# Patient Record
Sex: Female | Born: 1940
Health system: Southern US, Community
[De-identification: ages and names within clinical notes are randomized; demographics above are authoritative.]

## PROBLEM LIST (undated history)

## (undated) DIAGNOSIS — K5790 Diverticulosis of intestine, part unspecified, without perforation or abscess without bleeding: Secondary | ICD-10-CM

## (undated) DIAGNOSIS — K219 Gastro-esophageal reflux disease without esophagitis: Secondary | ICD-10-CM

## (undated) DIAGNOSIS — K5792 Diverticulitis of intestine, part unspecified, without perforation or abscess without bleeding: Secondary | ICD-10-CM

## (undated) DIAGNOSIS — M810 Age-related osteoporosis without current pathological fracture: Secondary | ICD-10-CM

## (undated) DIAGNOSIS — M1990 Unspecified osteoarthritis, unspecified site: Secondary | ICD-10-CM

## (undated) DIAGNOSIS — C4490 Unspecified malignant neoplasm of skin, unspecified: Secondary | ICD-10-CM

## (undated) DIAGNOSIS — Z0000 Encounter for general adult medical examination without abnormal findings: Secondary | ICD-10-CM

## (undated) DIAGNOSIS — B019 Varicella without complication: Secondary | ICD-10-CM

## (undated) DIAGNOSIS — F419 Anxiety disorder, unspecified: Secondary | ICD-10-CM

## (undated) DIAGNOSIS — N952 Postmenopausal atrophic vaginitis: Secondary | ICD-10-CM

## (undated) DIAGNOSIS — E785 Hyperlipidemia, unspecified: Secondary | ICD-10-CM

## (undated) DIAGNOSIS — R32 Unspecified urinary incontinence: Secondary | ICD-10-CM

## (undated) DIAGNOSIS — K635 Polyp of colon: Secondary | ICD-10-CM

## (undated) DIAGNOSIS — H269 Unspecified cataract: Secondary | ICD-10-CM

## (undated) DIAGNOSIS — H8110 Benign paroxysmal vertigo, unspecified ear: Secondary | ICD-10-CM

## (undated) DIAGNOSIS — E2740 Unspecified adrenocortical insufficiency: Secondary | ICD-10-CM

## (undated) DIAGNOSIS — I10 Essential (primary) hypertension: Secondary | ICD-10-CM

## (undated) DIAGNOSIS — B078 Other viral warts: Secondary | ICD-10-CM

## (undated) DIAGNOSIS — E559 Vitamin D deficiency, unspecified: Secondary | ICD-10-CM

## (undated) HISTORY — DX: Unspecified adrenocortical insufficiency: E27.40

## (undated) HISTORY — DX: Age-related osteoporosis without current pathological fracture: M81.0

## (undated) HISTORY — DX: Unspecified malignant neoplasm of skin, unspecified: C44.90

## (undated) HISTORY — DX: Encounter for general adult medical examination without abnormal findings: Z00.00

## (undated) HISTORY — DX: Gastro-esophageal reflux disease without esophagitis: K21.9

## (undated) HISTORY — DX: Essential (primary) hypertension: I10

## (undated) HISTORY — DX: Varicella without complication: B01.9

## (undated) HISTORY — DX: Unspecified osteoarthritis, unspecified site: M19.90

## (undated) HISTORY — PX: DILATION AND CURETTAGE OF UTERUS: SHX78

## (undated) HISTORY — DX: Vitamin D deficiency, unspecified: E55.9

## (undated) HISTORY — DX: Postmenopausal atrophic vaginitis: N95.2

## (undated) HISTORY — DX: Hyperlipidemia, unspecified: E78.5

## (undated) HISTORY — DX: Unspecified urinary incontinence: R32

## (undated) HISTORY — DX: Anxiety disorder, unspecified: F41.9

## (undated) HISTORY — DX: Unspecified cataract: H26.9

## (undated) HISTORY — DX: Diverticulitis of intestine, part unspecified, without perforation or abscess without bleeding: K57.92

## (undated) HISTORY — PX: CATARACT EXTRACTION: SUR2

## (undated) HISTORY — DX: Benign paroxysmal vertigo, unspecified ear: H81.10

## (undated) HISTORY — PX: EYE SURGERY: SHX253

## (undated) HISTORY — DX: Polyp of colon: K63.5

## (undated) HISTORY — DX: Other viral warts: B07.8

## (undated) HISTORY — PX: BREAST BIOPSY: SHX20

## (undated) HISTORY — DX: Diverticulosis of intestine, part unspecified, without perforation or abscess without bleeding: K57.90

---

## 1988-07-15 HISTORY — PX: TONSILLECTOMY AND ADENOIDECTOMY: SHX28

## 1995-07-16 HISTORY — PX: ABDOMINAL HYSTERECTOMY: SHX81

## 2011-07-25 DIAGNOSIS — M810 Age-related osteoporosis without current pathological fracture: Secondary | ICD-10-CM | POA: Diagnosis not present

## 2011-07-25 LAB — HM DEXA SCAN

## 2011-08-06 DIAGNOSIS — M999 Biomechanical lesion, unspecified: Secondary | ICD-10-CM | POA: Diagnosis not present

## 2011-08-06 DIAGNOSIS — M545 Low back pain: Secondary | ICD-10-CM | POA: Diagnosis not present

## 2011-08-06 DIAGNOSIS — M47817 Spondylosis without myelopathy or radiculopathy, lumbosacral region: Secondary | ICD-10-CM | POA: Diagnosis not present

## 2011-09-03 DIAGNOSIS — M47817 Spondylosis without myelopathy or radiculopathy, lumbosacral region: Secondary | ICD-10-CM | POA: Diagnosis not present

## 2011-09-03 DIAGNOSIS — M999 Biomechanical lesion, unspecified: Secondary | ICD-10-CM | POA: Diagnosis not present

## 2011-09-03 DIAGNOSIS — M545 Low back pain: Secondary | ICD-10-CM | POA: Diagnosis not present

## 2011-09-13 DEATH — deceased

## 2011-10-08 DIAGNOSIS — M47817 Spondylosis without myelopathy or radiculopathy, lumbosacral region: Secondary | ICD-10-CM | POA: Diagnosis not present

## 2011-10-08 DIAGNOSIS — M999 Biomechanical lesion, unspecified: Secondary | ICD-10-CM | POA: Diagnosis not present

## 2011-10-08 DIAGNOSIS — M545 Low back pain: Secondary | ICD-10-CM | POA: Diagnosis not present

## 2011-11-04 DIAGNOSIS — M810 Age-related osteoporosis without current pathological fracture: Secondary | ICD-10-CM | POA: Diagnosis not present

## 2011-11-12 DIAGNOSIS — M999 Biomechanical lesion, unspecified: Secondary | ICD-10-CM | POA: Diagnosis not present

## 2011-11-12 DIAGNOSIS — M47817 Spondylosis without myelopathy or radiculopathy, lumbosacral region: Secondary | ICD-10-CM | POA: Diagnosis not present

## 2011-11-12 DIAGNOSIS — M545 Low back pain: Secondary | ICD-10-CM | POA: Diagnosis not present

## 2011-11-21 DIAGNOSIS — Z961 Presence of intraocular lens: Secondary | ICD-10-CM | POA: Diagnosis not present

## 2011-11-21 DIAGNOSIS — H40019 Open angle with borderline findings, low risk, unspecified eye: Secondary | ICD-10-CM | POA: Diagnosis not present

## 2011-12-10 DIAGNOSIS — M47817 Spondylosis without myelopathy or radiculopathy, lumbosacral region: Secondary | ICD-10-CM | POA: Diagnosis not present

## 2011-12-10 DIAGNOSIS — M545 Low back pain: Secondary | ICD-10-CM | POA: Diagnosis not present

## 2011-12-10 DIAGNOSIS — M999 Biomechanical lesion, unspecified: Secondary | ICD-10-CM | POA: Diagnosis not present

## 2011-12-31 DIAGNOSIS — J3089 Other allergic rhinitis: Secondary | ICD-10-CM | POA: Diagnosis not present

## 2011-12-31 DIAGNOSIS — H6120 Impacted cerumen, unspecified ear: Secondary | ICD-10-CM | POA: Diagnosis not present

## 2011-12-31 DIAGNOSIS — H903 Sensorineural hearing loss, bilateral: Secondary | ICD-10-CM | POA: Diagnosis not present

## 2011-12-31 DIAGNOSIS — J310 Chronic rhinitis: Secondary | ICD-10-CM | POA: Diagnosis not present

## 2012-01-09 DIAGNOSIS — Z1231 Encounter for screening mammogram for malignant neoplasm of breast: Secondary | ICD-10-CM | POA: Diagnosis not present

## 2012-01-14 DIAGNOSIS — M545 Low back pain: Secondary | ICD-10-CM | POA: Diagnosis not present

## 2012-01-14 DIAGNOSIS — M47817 Spondylosis without myelopathy or radiculopathy, lumbosacral region: Secondary | ICD-10-CM | POA: Diagnosis not present

## 2012-01-14 DIAGNOSIS — M999 Biomechanical lesion, unspecified: Secondary | ICD-10-CM | POA: Diagnosis not present

## 2012-02-25 DIAGNOSIS — R51 Headache: Secondary | ICD-10-CM | POA: Diagnosis not present

## 2012-02-25 DIAGNOSIS — M542 Cervicalgia: Secondary | ICD-10-CM | POA: Diagnosis not present

## 2012-02-25 DIAGNOSIS — M47812 Spondylosis without myelopathy or radiculopathy, cervical region: Secondary | ICD-10-CM | POA: Diagnosis not present

## 2012-02-25 DIAGNOSIS — M9981 Other biomechanical lesions of cervical region: Secondary | ICD-10-CM | POA: Diagnosis not present

## 2012-03-31 DIAGNOSIS — M47812 Spondylosis without myelopathy or radiculopathy, cervical region: Secondary | ICD-10-CM | POA: Diagnosis not present

## 2012-03-31 DIAGNOSIS — R51 Headache: Secondary | ICD-10-CM | POA: Diagnosis not present

## 2012-03-31 DIAGNOSIS — M9981 Other biomechanical lesions of cervical region: Secondary | ICD-10-CM | POA: Diagnosis not present

## 2012-03-31 DIAGNOSIS — M542 Cervicalgia: Secondary | ICD-10-CM | POA: Diagnosis not present

## 2012-04-21 DIAGNOSIS — Z0000 Encounter for general adult medical examination without abnormal findings: Secondary | ICD-10-CM | POA: Diagnosis not present

## 2012-05-06 DIAGNOSIS — K219 Gastro-esophageal reflux disease without esophagitis: Secondary | ICD-10-CM | POA: Diagnosis not present

## 2012-05-06 DIAGNOSIS — Z23 Encounter for immunization: Secondary | ICD-10-CM | POA: Diagnosis not present

## 2012-05-06 DIAGNOSIS — M899 Disorder of bone, unspecified: Secondary | ICD-10-CM | POA: Diagnosis not present

## 2012-05-06 DIAGNOSIS — M949 Disorder of cartilage, unspecified: Secondary | ICD-10-CM | POA: Diagnosis not present

## 2012-05-06 DIAGNOSIS — I10 Essential (primary) hypertension: Secondary | ICD-10-CM | POA: Diagnosis not present

## 2012-05-06 DIAGNOSIS — E7800 Pure hypercholesterolemia, unspecified: Secondary | ICD-10-CM | POA: Diagnosis not present

## 2012-05-19 DIAGNOSIS — M9981 Other biomechanical lesions of cervical region: Secondary | ICD-10-CM | POA: Diagnosis not present

## 2012-05-19 DIAGNOSIS — M47812 Spondylosis without myelopathy or radiculopathy, cervical region: Secondary | ICD-10-CM | POA: Diagnosis not present

## 2012-05-19 DIAGNOSIS — M542 Cervicalgia: Secondary | ICD-10-CM | POA: Diagnosis not present

## 2012-05-19 DIAGNOSIS — R51 Headache: Secondary | ICD-10-CM | POA: Diagnosis not present

## 2012-05-27 DIAGNOSIS — N760 Acute vaginitis: Secondary | ICD-10-CM | POA: Diagnosis not present

## 2012-05-27 DIAGNOSIS — N390 Urinary tract infection, site not specified: Secondary | ICD-10-CM | POA: Diagnosis not present

## 2012-06-30 DIAGNOSIS — H6120 Impacted cerumen, unspecified ear: Secondary | ICD-10-CM | POA: Diagnosis not present

## 2012-06-30 DIAGNOSIS — M9981 Other biomechanical lesions of cervical region: Secondary | ICD-10-CM | POA: Diagnosis not present

## 2012-06-30 DIAGNOSIS — M47812 Spondylosis without myelopathy or radiculopathy, cervical region: Secondary | ICD-10-CM | POA: Diagnosis not present

## 2012-06-30 DIAGNOSIS — R51 Headache: Secondary | ICD-10-CM | POA: Diagnosis not present

## 2012-06-30 DIAGNOSIS — M542 Cervicalgia: Secondary | ICD-10-CM | POA: Diagnosis not present

## 2012-08-04 DIAGNOSIS — M545 Low back pain: Secondary | ICD-10-CM | POA: Diagnosis not present

## 2012-08-04 DIAGNOSIS — M999 Biomechanical lesion, unspecified: Secondary | ICD-10-CM | POA: Diagnosis not present

## 2012-08-04 DIAGNOSIS — M47817 Spondylosis without myelopathy or radiculopathy, lumbosacral region: Secondary | ICD-10-CM | POA: Diagnosis not present

## 2012-09-01 DIAGNOSIS — M999 Biomechanical lesion, unspecified: Secondary | ICD-10-CM | POA: Diagnosis not present

## 2012-09-01 DIAGNOSIS — M545 Low back pain: Secondary | ICD-10-CM | POA: Diagnosis not present

## 2012-09-01 DIAGNOSIS — M47817 Spondylosis without myelopathy or radiculopathy, lumbosacral region: Secondary | ICD-10-CM | POA: Diagnosis not present

## 2012-10-06 DIAGNOSIS — M47817 Spondylosis without myelopathy or radiculopathy, lumbosacral region: Secondary | ICD-10-CM | POA: Diagnosis not present

## 2012-10-06 DIAGNOSIS — M999 Biomechanical lesion, unspecified: Secondary | ICD-10-CM | POA: Diagnosis not present

## 2012-10-06 DIAGNOSIS — M545 Low back pain: Secondary | ICD-10-CM | POA: Diagnosis not present

## 2012-11-02 DIAGNOSIS — D235 Other benign neoplasm of skin of trunk: Secondary | ICD-10-CM | POA: Diagnosis not present

## 2012-11-02 DIAGNOSIS — D1801 Hemangioma of skin and subcutaneous tissue: Secondary | ICD-10-CM | POA: Diagnosis not present

## 2012-11-02 DIAGNOSIS — L821 Other seborrheic keratosis: Secondary | ICD-10-CM | POA: Diagnosis not present

## 2012-11-02 DIAGNOSIS — L819 Disorder of pigmentation, unspecified: Secondary | ICD-10-CM | POA: Diagnosis not present

## 2012-11-04 DIAGNOSIS — K219 Gastro-esophageal reflux disease without esophagitis: Secondary | ICD-10-CM | POA: Diagnosis not present

## 2012-11-04 DIAGNOSIS — I10 Essential (primary) hypertension: Secondary | ICD-10-CM | POA: Diagnosis not present

## 2012-11-04 DIAGNOSIS — E7800 Pure hypercholesterolemia, unspecified: Secondary | ICD-10-CM | POA: Diagnosis not present

## 2012-11-10 DIAGNOSIS — M47817 Spondylosis without myelopathy or radiculopathy, lumbosacral region: Secondary | ICD-10-CM | POA: Diagnosis not present

## 2012-11-10 DIAGNOSIS — M545 Low back pain: Secondary | ICD-10-CM | POA: Diagnosis not present

## 2012-11-10 DIAGNOSIS — M999 Biomechanical lesion, unspecified: Secondary | ICD-10-CM | POA: Diagnosis not present

## 2012-12-04 DIAGNOSIS — I10 Essential (primary) hypertension: Secondary | ICD-10-CM | POA: Diagnosis not present

## 2012-12-04 DIAGNOSIS — H35379 Puckering of macula, unspecified eye: Secondary | ICD-10-CM | POA: Diagnosis not present

## 2012-12-04 DIAGNOSIS — H40059 Ocular hypertension, unspecified eye: Secondary | ICD-10-CM | POA: Diagnosis not present

## 2012-12-29 DIAGNOSIS — M999 Biomechanical lesion, unspecified: Secondary | ICD-10-CM | POA: Diagnosis not present

## 2012-12-29 DIAGNOSIS — M545 Low back pain: Secondary | ICD-10-CM | POA: Diagnosis not present

## 2012-12-29 DIAGNOSIS — M47817 Spondylosis without myelopathy or radiculopathy, lumbosacral region: Secondary | ICD-10-CM | POA: Diagnosis not present

## 2012-12-30 DIAGNOSIS — J3089 Other allergic rhinitis: Secondary | ICD-10-CM | POA: Diagnosis not present

## 2012-12-30 DIAGNOSIS — H903 Sensorineural hearing loss, bilateral: Secondary | ICD-10-CM | POA: Diagnosis not present

## 2012-12-30 DIAGNOSIS — H6120 Impacted cerumen, unspecified ear: Secondary | ICD-10-CM | POA: Diagnosis not present

## 2013-03-09 ENCOUNTER — Encounter: Payer: Self-pay | Admitting: Family Medicine

## 2013-03-09 ENCOUNTER — Other Ambulatory Visit: Payer: Self-pay

## 2013-03-09 ENCOUNTER — Ambulatory Visit (INDEPENDENT_AMBULATORY_CARE_PROVIDER_SITE_OTHER): Payer: Medicare Other | Admitting: Family Medicine

## 2013-03-09 ENCOUNTER — Telehealth: Payer: Self-pay | Admitting: Family Medicine

## 2013-03-09 VITALS — BP 138/84 | HR 65 | Temp 97.9°F | Ht 60.0 in | Wt 130.1 lb

## 2013-03-09 DIAGNOSIS — K219 Gastro-esophageal reflux disease without esophagitis: Secondary | ICD-10-CM | POA: Insufficient documentation

## 2013-03-09 DIAGNOSIS — F341 Dysthymic disorder: Secondary | ICD-10-CM | POA: Diagnosis not present

## 2013-03-09 DIAGNOSIS — R32 Unspecified urinary incontinence: Secondary | ICD-10-CM

## 2013-03-09 DIAGNOSIS — E785 Hyperlipidemia, unspecified: Secondary | ICD-10-CM

## 2013-03-09 DIAGNOSIS — D126 Benign neoplasm of colon, unspecified: Secondary | ICD-10-CM

## 2013-03-09 DIAGNOSIS — E2740 Unspecified adrenocortical insufficiency: Secondary | ICD-10-CM

## 2013-03-09 DIAGNOSIS — M810 Age-related osteoporosis without current pathological fracture: Secondary | ICD-10-CM

## 2013-03-09 DIAGNOSIS — F411 Generalized anxiety disorder: Secondary | ICD-10-CM

## 2013-03-09 DIAGNOSIS — I10 Essential (primary) hypertension: Secondary | ICD-10-CM

## 2013-03-09 DIAGNOSIS — M1990 Unspecified osteoarthritis, unspecified site: Secondary | ICD-10-CM | POA: Insufficient documentation

## 2013-03-09 DIAGNOSIS — F418 Other specified anxiety disorders: Secondary | ICD-10-CM

## 2013-03-09 DIAGNOSIS — K5790 Diverticulosis of intestine, part unspecified, without perforation or abscess without bleeding: Secondary | ICD-10-CM

## 2013-03-09 DIAGNOSIS — B019 Varicella without complication: Secondary | ICD-10-CM | POA: Insufficient documentation

## 2013-03-09 DIAGNOSIS — F419 Anxiety disorder, unspecified: Secondary | ICD-10-CM | POA: Insufficient documentation

## 2013-03-09 DIAGNOSIS — K635 Polyp of colon: Secondary | ICD-10-CM | POA: Insufficient documentation

## 2013-03-09 DIAGNOSIS — Z1231 Encounter for screening mammogram for malignant neoplasm of breast: Secondary | ICD-10-CM

## 2013-03-09 DIAGNOSIS — E2749 Other adrenocortical insufficiency: Secondary | ICD-10-CM

## 2013-03-09 DIAGNOSIS — K5730 Diverticulosis of large intestine without perforation or abscess without bleeding: Secondary | ICD-10-CM

## 2013-03-09 HISTORY — DX: Age-related osteoporosis without current pathological fracture: M81.0

## 2013-03-09 MED ORDER — FLUOXETINE HCL 20 MG PO TABS: 20.0000 mg | ORAL_TABLET | Freq: Every day | ORAL | Status: DC

## 2013-03-09 NOTE — Assessment & Plan Note (Signed)
asymptomatic

## 2013-03-09 NOTE — Assessment & Plan Note (Signed)
Check vitamin d level with next blood draw continue calcium, vitamin d supplements

## 2013-03-09 NOTE — Assessment & Plan Note (Signed)
Well controlled on Ramipril continue the same

## 2013-03-09 NOTE — Assessment & Plan Note (Signed)
Tolerating simvastatin, recheck level prior to next visit.

## 2013-03-09 NOTE — Assessment & Plan Note (Signed)
Well controlled on current meds, no changes, avoid offending foods

## 2013-03-09 NOTE — Assessment & Plan Note (Signed)
Well controlled on Fluoxetine, given a refill on this med today

## 2013-03-09 NOTE — Telephone Encounter (Signed)
LAB ORDER FOR THE WEEK OF 11-20 Check out comments: Next visit annual with labs prior to visit, lipid, renal, cbc, tsh, vitamin d

## 2013-03-09 NOTE — Patient Instructions (Addendum)
Preventive Care for Adults, Female A healthy lifestyle and preventive care can promote health and wellness. Preventive health guidelines for women include the following key practices.  A routine yearly physical is a good way to check with your caregiver about your health and preventive screening. It is a chance to share any concerns and updates on your health, and to receive a thorough exam.  Visit your dentist for a routine exam and preventive care every 6 months. Brush your teeth twice a day and floss once a day. Good oral hygiene prevents tooth decay and gum disease.  The frequency of eye exams is based on your age, health, family medical history, use of contact lenses, and other factors. Follow your caregiver's recommendations for frequency of eye exams.  Eat a healthy diet. Foods like vegetables, fruits, whole grains, low-fat dairy products, and lean protein foods contain the nutrients you need without too many calories. Decrease your intake of foods high in solid fats, added sugars, and salt. Eat the right amount of calories for you.Get information about a proper diet from your caregiver, if necessary.  Regular physical exercise is one of the most important things you can do for your health. Most adults should get at least 150 minutes of moderate-intensity exercise (any activity that increases your heart rate and causes you to sweat) each week. In addition, most adults need muscle-strengthening exercises on 2 or more days a week.  Maintain a healthy weight. The body mass index (BMI) is a screening tool to identify possible weight problems. It provides an estimate of body fat based on height and weight. Your caregiver can help determine your BMI, and can help you achieve or maintain a healthy weight.For adults 20 years and older:  A BMI below 18.5 is considered underweight.  A BMI of 18.5 to 24.9 is normal.  A BMI of 25 to 29.9 is considered overweight.  A BMI of 30 and above is  considered obese.  Maintain normal blood lipids and cholesterol levels by exercising and minimizing your intake of saturated fat. Eat a balanced diet with plenty of fruit and vegetables. Blood tests for lipids and cholesterol should begin at age 20 and be repeated every 5 years. If your lipid or cholesterol levels are high, you are over 50, or you are at high risk for heart disease, you may need your cholesterol levels checked more frequently.Ongoing high lipid and cholesterol levels should be treated with medicines if diet and exercise are not effective.  If you smoke, find out from your caregiver how to quit. If you do not use tobacco, do not start.  If you are pregnant, do not drink alcohol. If you are breastfeeding, be very cautious about drinking alcohol. If you are not pregnant and choose to drink alcohol, do not exceed 1 drink per day. One drink is considered to be 12 ounces (355 mL) of beer, 5 ounces (148 mL) of wine, or 1.5 ounces (44 mL) of liquor.  Avoid use of street drugs. Do not share needles with anyone. Ask for help if you need support or instructions about stopping the use of drugs.  High blood pressure causes heart disease and increases the risk of stroke. Your blood pressure should be checked at least every 1 to 2 years. Ongoing high blood pressure should be treated with medicines if weight loss and exercise are not effective.  If you are 55 to 72 years old, ask your caregiver if you should take aspirin to prevent strokes.  Diabetes   screening involves taking a blood sample to check your fasting blood sugar level. This should be done once every 3 years, after age 45, if you are within normal weight and without risk factors for diabetes. Testing should be considered at a younger age or be carried out more frequently if you are overweight and have at least 1 risk factor for diabetes.  Breast cancer screening is essential preventive care for women. You should practice "breast  self-awareness." This means understanding the normal appearance and feel of your breasts and may include breast self-examination. Any changes detected, no matter how small, should be reported to a caregiver. Women in their 20s and 30s should have a clinical breast exam (CBE) by a caregiver as part of a regular health exam every 1 to 3 years. After age 40, women should have a CBE every year. Starting at age 40, women should consider having a mammography (breast X-ray test) every year. Women who have a family history of breast cancer should talk to their caregiver about genetic screening. Women at a high risk of breast cancer should talk to their caregivers about having magnetic resonance imaging (MRI) and a mammography every year.  The Pap test is a screening test for cervical cancer. A Pap test can show cell changes on the cervix that might become cervical cancer if left untreated. A Pap test is a procedure in which cells are obtained and examined from the lower end of the uterus (cervix).  Women should have a Pap test starting at age 21.  Between ages 21 and 29, Pap tests should be repeated every 2 years.  Beginning at age 30, you should have a Pap test every 3 years as long as the past 3 Pap tests have been normal.  Some women have medical problems that increase the chance of getting cervical cancer. Talk to your caregiver about these problems. It is especially important to talk to your caregiver if a new problem develops soon after your last Pap test. In these cases, your caregiver may recommend more frequent screening and Pap tests.  The above recommendations are the same for women who have or have not gotten the vaccine for human papillomavirus (HPV).  If you had a hysterectomy for a problem that was not cancer or a condition that could lead to cancer, then you no longer need Pap tests. Even if you no longer need a Pap test, a regular exam is a good idea to make sure no other problems are  starting.  If you are between ages 65 and 70, and you have had normal Pap tests going back 10 years, you no longer need Pap tests. Even if you no longer need a Pap test, a regular exam is a good idea to make sure no other problems are starting.  If you have had past treatment for cervical cancer or a condition that could lead to cancer, you need Pap tests and screening for cancer for at least 20 years after your treatment.  If Pap tests have been discontinued, risk factors (such as a new sexual partner) need to be reassessed to determine if screening should be resumed.  The HPV test is an additional test that may be used for cervical cancer screening. The HPV test looks for the virus that can cause the cell changes on the cervix. The cells collected during the Pap test can be tested for HPV. The HPV test could be used to screen women aged 30 years and older, and should   be used in women of any age who have unclear Pap test results. After the age of 30, women should have HPV testing at the same frequency as a Pap test.  Colorectal cancer can be detected and often prevented. Most routine colorectal cancer screening begins at the age of 50 and continues through age 75. However, your caregiver may recommend screening at an earlier age if you have risk factors for colon cancer. On a yearly basis, your caregiver may provide home test kits to check for hidden blood in the stool. Use of a small camera at the end of a tube, to directly examine the colon (sigmoidoscopy or colonoscopy), can detect the earliest forms of colorectal cancer. Talk to your caregiver about this at age 50, when routine screening begins. Direct examination of the colon should be repeated every 5 to 10 years through age 75, unless early forms of pre-cancerous polyps or small growths are found.  Hepatitis C blood testing is recommended for all people born from 1945 through 1965 and any individual with known risks for hepatitis C.  Practice  safe sex. Use condoms and avoid high-risk sexual practices to reduce the spread of sexually transmitted infections (STIs). STIs include gonorrhea, chlamydia, syphilis, trichomonas, herpes, HPV, and human immunodeficiency virus (HIV). Herpes, HIV, and HPV are viral illnesses that have no cure. They can result in disability, cancer, and death. Sexually active women aged 25 and younger should be checked for chlamydia. Older women with new or multiple partners should also be tested for chlamydia. Testing for other STIs is recommended if you are sexually active and at increased risk.  Osteoporosis is a disease in which the bones lose minerals and strength with aging. This can result in serious bone fractures. The risk of osteoporosis can be identified using a bone density scan. Women ages 65 and over and women at risk for fractures or osteoporosis should discuss screening with their caregivers. Ask your caregiver whether you should take a calcium supplement or vitamin D to reduce the rate of osteoporosis.  Menopause can be associated with physical symptoms and risks. Hormone replacement therapy is available to decrease symptoms and risks. You should talk to your caregiver about whether hormone replacement therapy is right for you.  Use sunscreen with sun protection factor (SPF) of 30 or more. Apply sunscreen liberally and repeatedly throughout the day. You should seek shade when your shadow is shorter than you. Protect yourself by wearing long sleeves, pants, a wide-brimmed hat, and sunglasses year round, whenever you are outdoors.  Once a month, do a whole body skin exam, using a mirror to look at the skin on your back. Notify your caregiver of new moles, moles that have irregular borders, moles that are larger than a pencil eraser, or moles that have changed in shape or color.  Stay current with required immunizations.  Influenza. You need a dose every fall (or winter). The composition of the flu vaccine  changes each year, so being vaccinated once is not enough.  Pneumococcal polysaccharide. You need 1 to 2 doses if you smoke cigarettes or if you have certain chronic medical conditions. You need 1 dose at age 65 (or older) if you have never been vaccinated.  Tetanus, diphtheria, pertussis (Tdap, Td). Get 1 dose of Tdap vaccine if you are younger than age 65, are over 65 and have contact with an infant, are a healthcare worker, are pregnant, or simply want to be protected from whooping cough. After that, you need a Td   booster dose every 10 years. Consult your caregiver if you have not had at least 3 tetanus and diphtheria-containing shots sometime in your life or have a deep or dirty wound.  HPV. You need this vaccine if you are a woman age 26 or younger. The vaccine is given in 3 doses over 6 months.  Measles, mumps, rubella (MMR). You need at least 1 dose of MMR if you were born in 1957 or later. You may also need a second dose.  Meningococcal. If you are age 19 to 21 and a first-year college student living in a residence [place], or have one of several medical conditions, you need to get vaccinated against meningococcal disease. You may also need additional booster doses.  Zoster (shingles). If you are age 60 or older, you should get this vaccine.  Varicella (chickenpox). If you have never had chickenpox or you were vaccinated but received only 1 dose, talk to your caregiver to find out if you need this vaccine.  Hepatitis A. You need this vaccine if you have a specific risk factor for hepatitis A virus infection or you simply wish to be protected from this disease. The vaccine is usually given as 2 doses, 6 to 18 months apart.  Hepatitis B. You need this vaccine if you have a specific risk factor for hepatitis B virus infection or you simply wish to be protected from this disease. The vaccine is given in 3 doses, usually over 6 months. Preventive Services / Frequency Ages 19 to 39  Blood  pressure check.** / Every 1 to 2 years.  Lipid and cholesterol check.** / Every 5 years beginning at age 20.  Clinical breast exam.** / Every 3 years for women in their 20s and 30s.  Pap test.** / Every 2 years from ages 21 through 29. Every 3 years starting at age 30 through age 65 or 70 with a history of 3 consecutive normal Pap tests.  HPV screening.** / Every 3 years from ages 30 through ages 65 to 70 with a history of 3 consecutive normal Pap tests.  Hepatitis C blood test.** / For any individual with known risks for hepatitis C.  Skin self-exam. / Monthly.  Influenza immunization.** / Every year.  Pneumococcal polysaccharide immunization.** / 1 to 2 doses if you smoke cigarettes or if you have certain chronic medical conditions.  Tetanus, diphtheria, pertussis (Tdap, Td) immunization. / A one-time dose of Tdap vaccine. After that, you need a Td booster dose every 10 years.  HPV immunization. / 3 doses over 6 months, if you are 26 and younger.  Measles, mumps, rubella (MMR) immunization. / You need at least 1 dose of MMR if you were born in 1957 or later. You may also need a second dose.  Meningococcal immunization. / 1 dose if you are age 19 to 21 and a first-year college student living in a residence [place], or have one of several medical conditions, you need to get vaccinated against meningococcal disease. You may also need additional booster doses.  Varicella immunization.** / Consult your caregiver.  Hepatitis A immunization.** / Consult your caregiver. 2 doses, 6 to 18 months apart.  Hepatitis B immunization.** / Consult your caregiver. 3 doses usually over 6 months. Ages 40 to 64  Blood pressure check.** / Every 1 to 2 years.  Lipid and cholesterol check.** / Every 5 years beginning at age 20.  Clinical breast exam.** / Every year after age 40.  Mammogram.** / Every year beginning at age 40   and continuing for as long as you are in good health. Consult with your  caregiver.  Pap test.** / Every 3 years starting at age 30 through age 65 or 70 with a history of 3 consecutive normal Pap tests.  HPV screening.** / Every 3 years from ages 30 through ages 65 to 70 with a history of 3 consecutive normal Pap tests.  Fecal occult blood test (FOBT) of stool. / Every year beginning at age 50 and continuing until age 75. You may not need to do this test if you get a colonoscopy every 10 years.  Flexible sigmoidoscopy or colonoscopy.** / Every 5 years for a flexible sigmoidoscopy or every 10 years for a colonoscopy beginning at age 50 and continuing until age 75.  Hepatitis C blood test.** / For all people born from 1945 through 1965 and any individual with known risks for hepatitis C.  Skin self-exam. / Monthly.  Influenza immunization.** / Every year.  Pneumococcal polysaccharide immunization.** / 1 to 2 doses if you smoke cigarettes or if you have certain chronic medical conditions.  Tetanus, diphtheria, pertussis (Tdap, Td) immunization.** / A one-time dose of Tdap vaccine. After that, you need a Td booster dose every 10 years.  Measles, mumps, rubella (MMR) immunization. / You need at least 1 dose of MMR if you were born in 1957 or later. You may also need a second dose.  Varicella immunization.** / Consult your caregiver.  Meningococcal immunization.** / Consult your caregiver.  Hepatitis A immunization.** / Consult your caregiver. 2 doses, 6 to 18 months apart.  Hepatitis B immunization.** / Consult your caregiver. 3 doses, usually over 6 months. Ages 65 and over  Blood pressure check.** / Every 1 to 2 years.  Lipid and cholesterol check.** / Every 5 years beginning at age 20.  Clinical breast exam.** / Every year after age 40.  Mammogram.** / Every year beginning at age 40 and continuing for as long as you are in good health. Consult with your caregiver.  Pap test.** / Every 3 years starting at age 30 through age 65 or 70 with a 3  consecutive normal Pap tests. Testing can be stopped between 65 and 70 with 3 consecutive normal Pap tests and no abnormal Pap or HPV tests in the past 10 years.  HPV screening.** / Every 3 years from ages 30 through ages 65 or 70 with a history of 3 consecutive normal Pap tests. Testing can be stopped between 65 and 70 with 3 consecutive normal Pap tests and no abnormal Pap or HPV tests in the past 10 years.  Fecal occult blood test (FOBT) of stool. / Every year beginning at age 50 and continuing until age 75. You may not need to do this test if you get a colonoscopy every 10 years.  Flexible sigmoidoscopy or colonoscopy.** / Every 5 years for a flexible sigmoidoscopy or every 10 years for a colonoscopy beginning at age 50 and continuing until age 75.  Hepatitis C blood test.** / For all people born from 1945 through 1965 and any individual with known risks for hepatitis C.  Osteoporosis screening.** / A one-time screening for women ages 65 and over and women at risk for fractures or osteoporosis.  Skin self-exam. / Monthly.  Influenza immunization.** / Every year.  Pneumococcal polysaccharide immunization.** / 1 dose at age 65 (or older) if you have never been vaccinated.  Tetanus, diphtheria, pertussis (Tdap, Td) immunization. / A one-time dose of Tdap vaccine if you are over   65 and have contact with an infant, are a healthcare worker, or simply want to be protected from whooping cough. After that, you need a Td booster dose every 10 years.  Varicella immunization.** / Consult your caregiver.  Meningococcal immunization.** / Consult your caregiver.  Hepatitis A immunization.** / Consult your caregiver. 2 doses, 6 to 18 months apart.  Hepatitis B immunization.** / Check with your caregiver. 3 doses, usually over 6 months. ** Family history and personal history of risk and conditions may change your caregiver's recommendations. Document Released: 08/27/2001 Document Revised: 09/23/2011  Document Reviewed: 11/26/2010 [employer] Patient Information 2014 [employer].  

## 2013-03-09 NOTE — Assessment & Plan Note (Signed)
Instructed on Kegel exercises and consider urologic referral if no improvement, consider [facility]

## 2013-03-14 ENCOUNTER — Encounter: Payer: Self-pay | Admitting: Family Medicine

## 2013-03-14 DIAGNOSIS — E2740 Unspecified adrenocortical insufficiency: Secondary | ICD-10-CM | POA: Insufficient documentation

## 2013-03-14 NOTE — Assessment & Plan Note (Signed)
Patient reported in past

## 2013-03-14 NOTE — Progress Notes (Signed)
Patient ID: Terri Sandoval, female   DOB: 1940/11/28, 72 y.o.   MRN: [id number] Terri Sandoval [id number] Oct 24, 1940 03/14/2013      Progress Note-Follow Up  Subjective  Chief Complaint  Chief Complaint  Patient presents with  . Establish Care    new patient    HPI  Patient is a 72 year old Caucasian female who is in today for new patient appointment. She is generally doing well but does have a few concerns. Her major concern is her increasing urinary incontinence over the past year. She denies any dysuria or hematuria. No abdominal or back pain. Does note she has mild stress urinary incontinence most of the time and feels well and she has occurred she has complete loss of control. Otherwise no fevers or chills. No chest pain, palpitations, shortness of breath, GI c/o.  Past Medical History  Diagnosis Date  . GERD (gastroesophageal reflux disease)   . Hypertension   . Anxiety   . Hyperlipidemia   . Arthritis   . Chicken pox as a child  . Diverticulitis   . Colon polyps     removed during colonscopy  . Urine incontinence     sometimes  . Diverticulosis   . Osteoporosis, unspecified 03/09/2013  . Adrenal insufficiency     Past Surgical History  Procedure Laterality Date  . Abdominal hysterectomy  97    has ovaries  . Tonsillectomy and adenoidectomy  1990  . Dilation and curettage of uterus  1990, 1993, 1997  . Breast surgery      breast biopsy, left, FNA    Family History  Problem Relation Age of Onset  . Arthritis Mother   . Hyperlipidemia Mother   . Heart disease Mother   . Hypertension Mother   . Cancer Father     colon  . Diabetes Sister     type 2  . Heart disease Sister   . Mental illness Maternal Uncle   . Stroke Maternal Grandmother   . Pneumonia Maternal Grandfather   . Cerebral palsy Son     MR    History   Social History  . Marital Status: Married    Spouse Name: N/A    Number of Children: N/A  . Years of Education: N/A   Occupational  History  . Not on file.   Social History Main Topics  . Smoking status: Never Smoker   . Smokeless tobacco: Never Used  . Alcohol Use: No  . Drug Use: No  . Sexual Activity: Not on file   Other Topics Concern  . Not on file   Social History Narrative  . No narrative on file    No current outpatient prescriptions on file prior to visit.   No current facility-administered medications on file prior to visit.    Allergies  Allergen Reactions  . Sulfamethoxazole W-Trimethoprim     Very sore, sore in corner of lip    Review of Systems  Review of Systems  Constitutional: Negative for fever, chills and malaise/fatigue.  HENT: Negative for hearing loss, nosebleeds and congestion.   Eyes: Negative for discharge.  Respiratory: Negative for cough, sputum production, shortness of breath and wheezing.   Cardiovascular: Negative for chest pain, palpitations and leg swelling.  Gastrointestinal: Negative for heartburn, nausea, vomiting, abdominal pain, diarrhea, constipation and blood in stool.  Genitourinary: Negative for dysuria, urgency, frequency and hematuria.  Musculoskeletal: Negative for myalgias, back pain and falls.  Skin: Negative for rash.  Neurological: Negative for dizziness, tremors,  sensory change, focal weakness, loss of consciousness, weakness and headaches.  Endo/Heme/Allergies: Negative for polydipsia. Does not bruise/bleed easily.  Psychiatric/Behavioral: Negative for depression and suicidal ideas. The patient is not nervous/anxious and does not have insomnia.     Objective  BP 138/84  Pulse 65  Temp(Src) 97.9 F (36.6 C) (Oral)  Ht 5' (1.524 m)  Wt 130 lb 1.9 oz (59.022 kg)  BMI 25.41 kg/m2  SpO2 96%  Physical Exam  Physical Exam  Constitutional: She is oriented to person, place, and time and well-developed, well-nourished, and in no distress. No distress.  HENT:  Head: Normocephalic and atraumatic.  Right Ear: External ear normal.  Left Ear:  External ear normal.  Nose: Nose normal.  Mouth/Throat: Oropharynx is clear and moist. No oropharyngeal exudate.  Eyes: Conjunctivae are normal. Pupils are equal, round, and reactive to light. Right eye exhibits no discharge. Left eye exhibits no discharge. No scleral icterus.  Neck: Normal range of motion. Neck supple. No thyromegaly present.  Cardiovascular: Normal rate, regular rhythm, normal heart sounds and intact distal pulses.   No murmur heard. Pulmonary/Chest: Effort normal and breath sounds normal. No respiratory distress. She has no wheezes. She has no rales.  Abdominal: Soft. Bowel sounds are normal. She exhibits no distension and no mass. There is no tenderness.  Musculoskeletal: Normal range of motion. She exhibits no edema and no tenderness.  Lymphadenopathy:    She has no cervical adenopathy.  Neurological: She is alert and oriented to person, place, and time. She has normal reflexes. No cranial nerve deficit. Coordination normal.  Skin: Skin is warm and dry. No rash noted. She is not diaphoretic.  Psychiatric: Mood, memory and affect normal.       Assessment & Plan  Urine incontinence Instructed on Kegel exercises and consider urologic referral if no improvement, consider [facility]  GERD (gastroesophageal reflux disease) Well controlled on current meds, no changes, avoid offending foods  Colon polyps asymptomatic  Anxiety Well controlled on Fluoxetine, given a refill on this med today  Hypertension Well controlled on Ramipril continue the same  Hyperlipidemia Tolerating simvastatin, recheck level prior to next visit.  Diverticulosis asymptomatic  Osteoporosis, unspecified Check vitamin d level with next blood draw continue calcium, vitamin d supplements  Adrenal insufficiency Patient reported in past

## 2013-03-23 ENCOUNTER — Encounter: Payer: Self-pay | Admitting: *Deleted

## 2013-04-02 ENCOUNTER — Ambulatory Visit
Admission: RE | Admit: 2013-04-02 | Discharge: 2013-04-02 | Disposition: A | Discharge status: Home / Self Care | Payer: Medicare Other | Source: Ambulatory Visit

## 2013-04-02 DIAGNOSIS — Z1231 Encounter for screening mammogram for malignant neoplasm of breast: Secondary | ICD-10-CM | POA: Diagnosis not present

## 2013-04-13 ENCOUNTER — Ambulatory Visit (INDEPENDENT_AMBULATORY_CARE_PROVIDER_SITE_OTHER): Payer: Medicare Other

## 2013-04-13 DIAGNOSIS — Z23 Encounter for immunization: Secondary | ICD-10-CM | POA: Diagnosis not present

## 2013-05-03 DIAGNOSIS — M810 Age-related osteoporosis without current pathological fracture: Secondary | ICD-10-CM | POA: Diagnosis not present

## 2013-05-03 DIAGNOSIS — I10 Essential (primary) hypertension: Secondary | ICD-10-CM | POA: Diagnosis not present

## 2013-05-03 DIAGNOSIS — E785 Hyperlipidemia, unspecified: Secondary | ICD-10-CM | POA: Diagnosis not present

## 2013-05-03 LAB — TSH: TSH: 2.786 u[IU]/mL (ref 0.350–4.500)

## 2013-05-03 LAB — CBC
HCT: 39.2 % (ref 36.0–46.0)
Hemoglobin: 13.1 g/dL (ref 12.0–15.0)
MCH: 28.6 pg (ref 26.0–34.0)
RBC: 4.58 MIL/uL (ref 3.87–5.11)

## 2013-05-03 LAB — LIPID PANEL
HDL: 71 mg/dL (ref >39)
LDL Cholesterol: 99 mg/dL (ref 0–99)
Triglycerides: 95 mg/dL (ref <150)
VLDL: 19 mg/dL (ref 0–40)

## 2013-05-03 LAB — RENAL FUNCTION PANEL
BUN: 13 mg/dL (ref 6–23)
Calcium: 9.4 mg/dL (ref 8.4–10.5)
Chloride: 98 mEq/L (ref 96–112)
Glucose, Bld: 99 mg/dL (ref 70–99)
Potassium: 4 mEq/L (ref 3.5–5.3)
Sodium: 134 mEq/L — ABNORMAL LOW (ref 135–145)

## 2013-05-04 LAB — VITAMIN D 25 HYDROXY (VIT D DEFICIENCY, FRACTURES): Vit D, 25-Hydroxy: 49 ng/mL (ref 30–89)

## 2013-05-10 ENCOUNTER — Ambulatory Visit (INDEPENDENT_AMBULATORY_CARE_PROVIDER_SITE_OTHER): Payer: Medicare Other | Admitting: Family Medicine

## 2013-05-10 ENCOUNTER — Encounter: Payer: Self-pay | Admitting: Family Medicine

## 2013-05-10 VITALS — BP 110/64 | HR 67 | Temp 97.6°F | Ht 60.0 in | Wt 133.1 lb

## 2013-05-10 DIAGNOSIS — Z0000 Encounter for general adult medical examination without abnormal findings: Secondary | ICD-10-CM

## 2013-05-10 DIAGNOSIS — R32 Unspecified urinary incontinence: Secondary | ICD-10-CM

## 2013-05-10 DIAGNOSIS — H6121 Impacted cerumen, right ear: Secondary | ICD-10-CM

## 2013-05-10 DIAGNOSIS — N952 Postmenopausal atrophic vaginitis: Secondary | ICD-10-CM

## 2013-05-10 DIAGNOSIS — F329 Major depressive disorder, single episode, unspecified: Secondary | ICD-10-CM

## 2013-05-10 DIAGNOSIS — H6120 Impacted cerumen, unspecified ear: Secondary | ICD-10-CM | POA: Diagnosis not present

## 2013-05-10 DIAGNOSIS — M810 Age-related osteoporosis without current pathological fracture: Secondary | ICD-10-CM

## 2013-05-10 DIAGNOSIS — K219 Gastro-esophageal reflux disease without esophagitis: Secondary | ICD-10-CM

## 2013-05-10 DIAGNOSIS — E785 Hyperlipidemia, unspecified: Secondary | ICD-10-CM

## 2013-05-10 DIAGNOSIS — I10 Essential (primary) hypertension: Secondary | ICD-10-CM

## 2013-05-10 DIAGNOSIS — H547 Unspecified visual loss: Secondary | ICD-10-CM | POA: Diagnosis not present

## 2013-05-10 DIAGNOSIS — F32A Depression, unspecified: Secondary | ICD-10-CM

## 2013-05-10 MED ORDER — NEOMYCIN-POLYMYXIN-HC 3.5-10000-1 OT SOLN: 3.0000 [drp] | Freq: Every day | OTIC | Status: DC

## 2013-05-10 MED ORDER — PROZAC 20 MG PO CAPS: 20.0000 mg | ORAL_CAPSULE | Freq: Every day | ORAL | Status: DC

## 2013-05-10 MED ORDER — ESTROGENS, CONJUGATED 0.625 MG/GM VA CREA: TOPICAL_CREAM | Freq: Every day | VAGINAL | Status: DC

## 2013-05-10 NOTE — Patient Instructions (Signed)
Cerumen Impaction A cerumen impaction is when the wax in your ear forms a plug. This plug usually causes reduced hearing. Sometimes it also causes an earache or dizziness. Removing a cerumen impaction can be difficult and painful. The wax sticks to the ear canal. The canal is sensitive and bleeds easily. If you try to remove a heavy wax buildup with a cotton tipped swab, you may push it in further. Irrigation with water, suction, and small ear curettes may be used to clear out the wax. If the impaction is fixed to the skin in the ear canal, ear drops may be needed for a few days to loosen the wax. People who build up a lot of wax frequently can use ear wax removal products available in your local drugstore. SEEK MEDICAL CARE IF:  You develop an earache, increased hearing loss, or marked dizziness. Document Released: 08/08/2004 Document Revised: 09/23/2011 Document Reviewed: 09/28/2009 Exit[employer] Patient Information 2014 [employer].  

## 2013-05-12 ENCOUNTER — Telehealth: Payer: Self-pay | Admitting: *Deleted

## 2013-05-13 NOTE — Telephone Encounter (Signed)
That is fine, if she has done well on generics I have no objection to changing to Fluoxetine with same sig, #

## 2013-05-13 NOTE — Telephone Encounter (Signed)
Pharmacy [Sam] informed of provider response, understood & agreed/SLS

## 2013-05-13 NOTE — Telephone Encounter (Signed)
Sam w/[facility] pharmacy called RE: patient's Prozac-Sig: Take [1] daily to be placed on Hold for pt; Rx sent to pharmacy as 'Dispense As Written-Brand Name Only'; pt has Hx of receiving generic and they are requesting to make change to dispense generic/SLS Please Advise.

## 2013-05-16 ENCOUNTER — Encounter: Payer: Self-pay | Admitting: Family Medicine

## 2013-05-16 DIAGNOSIS — Z0000 Encounter for general adult medical examination without abnormal findings: Secondary | ICD-10-CM

## 2013-05-16 HISTORY — DX: Encounter for general adult medical examination without abnormal findings: Z00.00

## 2013-05-16 NOTE — Assessment & Plan Note (Addendum)
Using Vagifem from her gynecologist and avoiding coffee that has helped some, consider urology referral in future if worsens

## 2013-05-16 NOTE — Assessment & Plan Note (Signed)
Improved with avoidance of coffee, avoid offending foods and take Omeprazole as needed. Start a probiotic

## 2013-05-16 NOTE — Assessment & Plan Note (Signed)
Patient doing well in her home. Denies any depression or falls. Feels well. Has no specific directives encouraged to consider. No recent vision or hearing changes

## 2013-05-16 NOTE — Progress Notes (Signed)
Patient ID: Terri Sandoval, female   DOB: [birth year], 72 y.o.   MRN: [id number] Terri Sandoval [id number] 04-19-1941 05/16/2013      Progress Note-Follow Up  Subjective  Chief Complaint  Chief Complaint  Patient presents with  . Annual Exam    physical    HPI  Patient is a 72 year old female who is in today for annual exam. Generally feels well. Is still frustrated with her urinary incontinence but this is better with the use of Vagifem and avoidance of coffee. No burning but she does have persistent frequency and urgency. She denies any recent illness no falls or depression. She is which she needs to get down at home and gradually. If occasionally years but no recent dermatology. Takes her medications as prescribed. Denies chest pain, palpitations, shortness of breath, GI or GU concerns at this time  Past Medical History  Diagnosis Date  . GERD (gastroesophageal reflux disease)   . Hypertension   . Anxiety   . Hyperlipidemia   . Arthritis   . Chicken pox as a child  . Diverticulitis   . Colon polyps     removed during colonscopy  . Urine incontinence     sometimes  . Diverticulosis   . Osteoporosis, unspecified 03/09/2013  . Adrenal insufficiency   . Medicare annual wellness visit, subsequent 05/16/2013    Past Surgical History  Procedure Laterality Date  . Abdominal hysterectomy  97    has ovaries  . Tonsillectomy and adenoidectomy  1990  . Dilation and curettage of uterus  1990, 1993, 1997  . Breast surgery      breast biopsy, left, FNA  . Cataract extraction  L 09/11/10, R 10/10/10    Family History  Problem Relation Age of Onset  . Arthritis Mother   . Hyperlipidemia Mother   . Heart disease Mother   . Hypertension Mother   . Cancer Father     colon  . Diabetes Sister     type 2  . Heart disease Sister   . Mental illness Maternal Uncle   . Stroke Maternal Grandmother   . Pneumonia Maternal Grandfather   . Cerebral palsy Son     MR    History   Social  History  . Marital Status: Married    Spouse Name: N/A    Number of Children: N/A  . Years of Education: N/A   Occupational History  . Not on file.   Social History Main Topics  . Smoking status: Never Smoker   . Smokeless tobacco: Never Used  . Alcohol Use: No  . Drug Use: No  . Sexual Activity: Not on file   Other Topics Concern  . Not on file   Social History Narrative  . No narrative on file    Current Outpatient Prescriptions on File Prior to Visit  Medication Sig Dispense Refill  . fish oil-omega-3 fatty acids 1000 MG capsule Take 1,200 mg by mouth daily.      Marland Kitchen glucosamine-chondroitin 500-400 MG tablet Take 1 tablet by mouth 3 (three) times daily.      . Multiple Vitamin (MULTIVITAMIN) tablet Take 1 tablet by mouth daily.      Marland Kitchen omeprazole (PRILOSEC) 40 MG capsule Take 40 mg by mouth daily.      . ramipril (ALTACE) 10 MG capsule Take 10 mg by mouth daily.      . simvastatin (ZOCOR) 20 MG tablet Take 20 mg by mouth every evening.  No current facility-administered medications on file prior to visit.    Allergies  Allergen Reactions  . Sulfamethoxazole-Trimethoprim     Very sore, sore in corner of lip    Review of Systems  Review of Systems  Constitutional: Negative for fever, chills and malaise/fatigue.  HENT: Negative for congestion, hearing loss and nosebleeds.   Eyes: Negative for discharge.  Respiratory: Negative for cough, sputum production, shortness of breath and wheezing.   Cardiovascular: Negative for chest pain, palpitations and leg swelling.  Gastrointestinal: Negative for heartburn, nausea, vomiting, abdominal pain, diarrhea, constipation and blood in stool.  Genitourinary: Positive for urgency. Negative for dysuria, frequency and hematuria.  Musculoskeletal: Negative for back pain, falls and myalgias.  Skin: Negative for rash.  Neurological: Negative for dizziness, tremors, sensory change, focal weakness, loss of consciousness, weakness and  headaches.  Endo/Heme/Allergies: Negative for polydipsia. Does not bruise/bleed easily.  Psychiatric/Behavioral: Negative for depression and suicidal ideas. The patient is not nervous/anxious and does not have insomnia.     Objective  BP 110/64  Pulse 67  Temp(Src) 97.6 F (36.4 C) (Oral)  Ht 5' (1.524 m)  Wt 133 lb 1.9 oz (60.383 kg)  BMI 26.00 kg/m2  SpO2 93%  Physical Exam  Physical Exam  Constitutional: She is oriented to person, place, and time and well-developed, well-nourished, and in no distress. No distress.  HENT:  Head: Normocephalic and atraumatic.  Right Ear: External ear normal.  Left Ear: External ear normal.  Nose: Nose normal.  Mouth/Throat: Oropharynx is clear and moist. No oropharyngeal exudate.  Cerumen b/l canals  Eyes: Conjunctivae are normal. Pupils are equal, round, and reactive to light. Right eye exhibits no discharge. Left eye exhibits no discharge. No scleral icterus.  Neck: Normal range of motion. Neck supple. No thyromegaly present.  Cardiovascular: Normal rate, regular rhythm, normal heart sounds and intact distal pulses.   No murmur heard. Pulmonary/Chest: Effort normal and breath sounds normal. No respiratory distress. She has no wheezes. She has no rales.  Abdominal: Soft. Bowel sounds are normal. She exhibits no distension and no mass. There is no tenderness.  Musculoskeletal: Normal range of motion. She exhibits no edema and no tenderness.  Lymphadenopathy:    She has no cervical adenopathy.  Neurological: She is alert and oriented to person, place, and time. She has normal reflexes. No cranial nerve deficit. Coordination normal.  Skin: Skin is warm and dry. No rash noted. She is not diaphoretic.  Psychiatric: Mood, memory and affect normal.    Lab Results  Component Value Date   TSH 2.786 05/03/2013   Lab Results  Component Value Date   WBC 4.2 05/03/2013   HGB 13.1 05/03/2013   HCT 39.2 05/03/2013   MCV 85.6 05/03/2013   PLT 248  05/03/2013   Lab Results  Component Value Date   CREATININE 0.69 05/03/2013   BUN 13 05/03/2013   NA 134* 05/03/2013   K 4.0 05/03/2013   CL 98 05/03/2013   CO2 31 05/03/2013   No results found for this basename: ALT, AST, GGT, ALKPHOS, BILITOT   Lab Results  Component Value Date   CHOL 189 05/03/2013   Lab Results  Component Value Date   HDL 71 05/03/2013   Lab Results  Component Value Date   LDLCALC 99 05/03/2013   Lab Results  Component Value Date   TRIG 95 05/03/2013   Lab Results  Component Value Date   CHOLHDL 2.7 05/03/2013     Assessment & Plan  Hypertension  Well controlled, no changes to meds  Osteoporosis, unspecified Worsening. Unable to use estimates any longer and she used them for years. Calcium and vitamin D normal. Patient is a candidate for Prolia will proceed as patient is in agreement. Continue calcium and vitamin D supplements and regular exercise.  GERD (gastroesophageal reflux disease) Improved with avoidance of coffee, avoid offending foods and take Omeprazole as needed. Start a probiotic  Hyperlipidemia Well controlled on Simvasatin, avoid trans fats and continue the same  Urine incontinence Using Vagifem from her gynecologist and avoiding coffee that has helped some, consider urology referral in future if worsens  Medicare annual wellness visit, subsequent Patient doing well in her home. Denies any depression or falls. Feels well. Has no specific directives encouraged to consider. No recent vision or hearing changes

## 2013-05-16 NOTE — Assessment & Plan Note (Signed)
Worsening. Unable to use estimates any longer and she used them for years. Calcium and vitamin D normal. Patient is a candidate for Prolia will proceed as patient is in agreement. Continue calcium and vitamin D supplements and regular exercise.

## 2013-05-16 NOTE — Assessment & Plan Note (Signed)
Well controlled, no changes to meds 

## 2013-05-16 NOTE — Assessment & Plan Note (Signed)
Well controlled on Simvasatin, avoid trans fats and continue the same

## 2013-05-18 ENCOUNTER — Ambulatory Visit (INDEPENDENT_AMBULATORY_CARE_PROVIDER_SITE_OTHER)
Admission: RE | Admit: 2013-05-18 | Discharge: 2013-05-18 | Disposition: A | Discharge status: Home / Self Care | Payer: Medicare Other | Source: Ambulatory Visit | Attending: Family Medicine | Admitting: Family Medicine

## 2013-05-18 DIAGNOSIS — M810 Age-related osteoporosis without current pathological fracture: Secondary | ICD-10-CM

## 2013-05-18 LAB — HM DEXA SCAN

## 2013-05-24 ENCOUNTER — Telehealth: Payer: Self-pay

## 2013-05-24 NOTE — Telephone Encounter (Signed)
Message copied by Eulis Manly on Mon May 24, 2013  4:40 PM ------      Message from: Danise Edge A      Created: Thu May 20, 2013  9:06 PM       Notify patient still have has osteoporosis, I believe she took Fosamax for quite some time, just clarify this and see if she too anything else. If so she would be willing she would be a prolia candidate ------

## 2013-05-24 NOTE — Telephone Encounter (Signed)
Patient called and informed of results. Patient stated that she has only taken Fosamax. She started in 1999 and started in 2011. Patient stated that she had a previous bone density done after she stopped the Fosamax ans said that she could try to get the results if PCP was interested. Also, patient said that she was going to talk to her Daughter-in-law about the prolia injection and let us know what she decides.

## 2013-05-27 NOTE — Telephone Encounter (Signed)
Patient called back and stated for right now she wanted to hold off on the prolia injection. Patient stated that if she changed her mind that she would call back and let us know.

## 2013-05-28 ENCOUNTER — Telehealth: Payer: Self-pay

## 2013-05-28 NOTE — Telephone Encounter (Signed)
Call pt 

## 2013-05-28 NOTE — Telephone Encounter (Signed)
Message copied by Court Joy on Fri May 28, 2013 10:47 PM ------      Message from: Danise Edge A      Created: Sun May 16, 2013  8:50 AM       So her vit d and calcium are normal and her osteoporosis is worsening. We discussed that she would be a candidate for Prolia. Please make sure she wants it and then we need to order/arrange. Clarify which Bisphosphanate she took for a long time, I think it was Fosamax so we can document for prior auth      THX ------

## 2013-05-28 NOTE — Telephone Encounter (Signed)
Message copied by Court Joy on Fri May 28, 2013  8:57 PM ------      Message from: Danise Edge A      Created: Sun May 16, 2013  8:50 AM       So her vit d and calcium are normal and her osteoporosis is worsening. We discussed that she would be a candidate for Prolia. Please make sure she wants it and then we need to order/arrange. Clarify which Bisphosphanate she took for a long time, I think it was Fosamax so we can document for prior auth      THX ------

## 2013-05-31 NOTE — Telephone Encounter (Signed)
Patient states that Terri Sandoval had called her last week and stated she is going to wait to talk to MD.   Pt did state that she did take Fosamax for about 12 yrs.

## 2013-06-11 ENCOUNTER — Ambulatory Visit (INDEPENDENT_AMBULATORY_CARE_PROVIDER_SITE_OTHER): Payer: Medicare Other | Admitting: Family

## 2013-06-11 ENCOUNTER — Encounter: Payer: Self-pay | Admitting: Family

## 2013-06-11 VITALS — BP 126/84 | HR 57 | Temp 97.8°F | Resp 16 | Ht 60.0 in | Wt 132.1 lb

## 2013-06-11 DIAGNOSIS — H6120 Impacted cerumen, unspecified ear: Secondary | ICD-10-CM | POA: Insufficient documentation

## 2013-06-11 DIAGNOSIS — H6123 Impacted cerumen, bilateral: Secondary | ICD-10-CM

## 2013-06-11 NOTE — Patient Instructions (Signed)
Cerumen Plug A cerumen plug is having too much wax in your ear canal. The outer ear canal is lined with hairs and glands that secrete wax. This wax is called cerumen. This protects the ear canal. It also helps prevent material from entering the ear. Too much wax can cause a feeling of fullness in the ears, decreased hearing, ringing in the ears, or an earache. Sometimes your caregiver will remove a cerumen plug with an instrument called a curette. Or he/she may flush the ear canal with warm water from a syringe to remove the wax. You may simply be sent home to follow the home care instructions below for wax removal. Generally ear wax does not have to be removed unless it is causing a problem such as one of those listed above. When too much wax is causing a problem, the following are a few home remedies which can be used to help this problem. HOME CARE INSTRUCTIONS   Put a couple drops of glycerin, baby oil, or mineral oil in the ear a couple times of day. Do this every day for several days. After putting the drops in, you will need to lay with the affected ear pointing up for a couple minutes. This allows the drops to remain in the canal and run down to the area of wax blockage. This will soften the wax plug. It may also make your hearing worse as the wax softens and blocks the canal even more.  After a couple days, you may gently flush the ear canal with warm water from a syringe. Do this by pulling your ear up and back with your head tilted slightly forward and towards a pan to catch the water. This is most easily done with a helper. You can also accomplish the same thing by letting the shower beat into your ear canal to wash the wax out. Sometimes this will not be immediately successful. You will have to return to the first step of using the oil to further soften the wax. Then resume washing the ear canal out with a syringe or shower.  Following removal of the wax, put ten to twenty drops of rubbing  alcohol into the outer ears. This will dry the canal and prevent an infection.  Do not irrigate or wash out your ears if you have had a perforated ear drum or mastoid surgery. SEEK IMMEDIATE MEDICAL CARE IF:   You are unsuccessful with the above instructions for home care.  You develop ear pain or drainage from the ear. MAKE SURE YOU:   Understand these instructions.  Will watch your condition.  Will get help right away if you are not doing well or get worse. Document Released: 03/26/2001 Document Revised: 09/23/2011 Document Reviewed: 06/22/2008 Exit[employer] Patient Information 2014 [employer].  

## 2013-06-11 NOTE — Progress Notes (Signed)
   Subjective:    Patient ID: Terri Sandoval, female    DOB: [birth year], 72 y.o.   MRN: [id number]  HPI  Terri Sandoval is a 72 yr old female who presents today with chief complaint of ear fullness.  Reports that started using cortisporin drops on Sunday night.    Review of Systems     Objective:   Physical Exam  Constitutional: She appears well-developed and well-nourished.  HENT:  Head: Normocephalic and atraumatic.  R ear with cerumen plug.  L ear small amount of cerumen          Assessment & Plan:

## 2013-06-11 NOTE — Progress Notes (Signed)
Pre visit review using our clinic review tool, if applicable. No additional management support is needed unless otherwise documented below in the visit note. 

## 2013-06-11 NOTE — Assessment & Plan Note (Signed)
Ceruminosis is noted.  Wax is removed by manual debridement using curette. Normal ear canals and normal TM's revealed after cleaning. Instructions for home care to prevent wax buildup are given.

## 2013-06-16 ENCOUNTER — Other Ambulatory Visit: Payer: Self-pay | Admitting: Family Medicine

## 2013-07-19 DIAGNOSIS — M9981 Other biomechanical lesions of cervical region: Secondary | ICD-10-CM | POA: Diagnosis not present

## 2013-07-19 DIAGNOSIS — M5030 Other cervical disc degeneration, unspecified cervical region: Secondary | ICD-10-CM | POA: Diagnosis not present

## 2013-07-27 ENCOUNTER — Other Ambulatory Visit: Payer: Self-pay | Admitting: Family Medicine

## 2013-08-17 DIAGNOSIS — M9981 Other biomechanical lesions of cervical region: Secondary | ICD-10-CM | POA: Diagnosis not present

## 2013-08-17 DIAGNOSIS — M5030 Other cervical disc degeneration, unspecified cervical region: Secondary | ICD-10-CM | POA: Diagnosis not present

## 2013-09-14 DIAGNOSIS — M9981 Other biomechanical lesions of cervical region: Secondary | ICD-10-CM | POA: Diagnosis not present

## 2013-09-14 DIAGNOSIS — M5030 Other cervical disc degeneration, unspecified cervical region: Secondary | ICD-10-CM | POA: Diagnosis not present

## 2013-09-22 DIAGNOSIS — L570 Actinic keratosis: Secondary | ICD-10-CM | POA: Diagnosis not present

## 2013-09-22 DIAGNOSIS — Z85828 Personal history of other malignant neoplasm of skin: Secondary | ICD-10-CM | POA: Diagnosis not present

## 2013-09-22 DIAGNOSIS — L820 Inflamed seborrheic keratosis: Secondary | ICD-10-CM | POA: Diagnosis not present

## 2013-09-22 DIAGNOSIS — D485 Neoplasm of uncertain behavior of skin: Secondary | ICD-10-CM | POA: Diagnosis not present

## 2013-10-19 ENCOUNTER — Other Ambulatory Visit: Payer: Self-pay | Admitting: Family Medicine

## 2013-10-19 DIAGNOSIS — M5030 Other cervical disc degeneration, unspecified cervical region: Secondary | ICD-10-CM | POA: Diagnosis not present

## 2013-10-19 DIAGNOSIS — M9981 Other biomechanical lesions of cervical region: Secondary | ICD-10-CM | POA: Diagnosis not present

## 2013-10-19 NOTE — Telephone Encounter (Signed)
Patient also called on this requesting refills of ramipril and simvastatin  She is requesting a 90 day supply with 3 refills of both meds

## 2013-10-19 NOTE — Telephone Encounter (Signed)
I sent 90 day supply x 1 refill. Please let pt know that we do not give refills for a whole year.

## 2013-10-19 NOTE — Telephone Encounter (Signed)
Left message for patient to return my call.

## 2013-10-19 NOTE — Telephone Encounter (Signed)
Patient returned my call and I informed her of this.

## 2013-11-08 ENCOUNTER — Ambulatory Visit (INDEPENDENT_AMBULATORY_CARE_PROVIDER_SITE_OTHER): Payer: Medicare Other | Admitting: Family Medicine

## 2013-11-08 ENCOUNTER — Encounter: Payer: Self-pay | Admitting: Family Medicine

## 2013-11-08 VITALS — BP 120/70 | HR 65 | Temp 97.6°F | Ht 60.0 in | Wt 131.1 lb

## 2013-11-08 DIAGNOSIS — E785 Hyperlipidemia, unspecified: Secondary | ICD-10-CM

## 2013-11-08 DIAGNOSIS — N952 Postmenopausal atrophic vaginitis: Secondary | ICD-10-CM | POA: Insufficient documentation

## 2013-11-08 DIAGNOSIS — I10 Essential (primary) hypertension: Secondary | ICD-10-CM

## 2013-11-08 DIAGNOSIS — F411 Generalized anxiety disorder: Secondary | ICD-10-CM

## 2013-11-08 DIAGNOSIS — F419 Anxiety disorder, unspecified: Secondary | ICD-10-CM

## 2013-11-08 HISTORY — DX: Postmenopausal atrophic vaginitis: N95.2

## 2013-11-08 MED ORDER — RAMIPRIL 10 MG PO CAPS: 10.0000 mg | ORAL_CAPSULE | Freq: Every day | ORAL | Status: DC

## 2013-11-08 MED ORDER — SIMVASTATIN 20 MG PO TABS: 20.0000 mg | ORAL_TABLET | Freq: Every day | ORAL | Status: DC

## 2013-11-08 NOTE — Assessment & Plan Note (Signed)
Well controlled, no changes to meds. Encouraged heart healthy diet such as the DASH diet and exercise as tolerated.  °

## 2013-11-08 NOTE — Assessment & Plan Note (Signed)
Tolerating statin, encouraged heart healthy diet, avoid trans fats, minimize simple carbs and saturated fats. Increase exercise as tolerated 

## 2013-11-08 NOTE — Progress Notes (Signed)
Patient ID: Terri Sandoval, female   DOB: Jun 03, 1941, 73 y.o.   MRN: [id number] Terri Sandoval [id number] 08-18-1940 11/08/2013      Progress Note-Follow Up  Subjective  Chief Complaint  Chief Complaint  Patient presents with  . Follow-up    6 month    HPI  Patient is a 73 year old female in today for routine medical care. She is doing well. Her anxiety is improved. No recent illnees, fevers or concerns. Denies CP/palp/SOB/HA/congestion/fevers/GI or GU c/o. Taking meds as prescribed  Past Medical History  Diagnosis Date  . GERD (gastroesophageal reflux disease)   . Hypertension   . Anxiety   . Hyperlipidemia   . Arthritis   . Chicken pox as a child  . Diverticulitis   . Colon polyps     removed during colonscopy  . Urine incontinence     sometimes  . Diverticulosis   . Osteoporosis, unspecified 03/09/2013  . Adrenal insufficiency   . Medicare annual wellness visit, subsequent 05/16/2013    Past Surgical History  Procedure Laterality Date  . Abdominal hysterectomy  97    has ovaries  . Tonsillectomy and adenoidectomy  1990  . Dilation and curettage of uterus  1990, 1993, 1997  . Breast surgery      breast biopsy, left, FNA  . Cataract extraction  L 09/11/10, R 10/10/10    Family History  Problem Relation Age of Onset  . Arthritis Mother   . Hyperlipidemia Mother   . Heart disease Mother   . Hypertension Mother   . Cancer Father     colon  . Diabetes Sister     type 2  . Heart disease Sister   . Mental illness Maternal Uncle   . Stroke Maternal Grandmother   . Pneumonia Maternal Grandfather   . Cerebral palsy Son     MR    History   Social History  . Marital Status: Married    Spouse Name: N/A    Number of Children: N/A  . Years of Education: N/A   Occupational History  . Not on file.   Social History Main Topics  . Smoking status: Never Smoker   . Smokeless tobacco: Never Used  . Alcohol Use: No  . Drug Use: No  . Sexual Activity: Not on file    Other Topics Concern  . Not on file   Social History Narrative  . No narrative on file    Current Outpatient Prescriptions on File Prior to Visit  Medication Sig Dispense Refill  . conjugated estrogens (PREMARIN) vaginal cream Place vaginally daily. X 1 week then drop to twice weekly  42.5 g  2  . fish oil-omega-3 fatty acids 1000 MG capsule Take 1,200 mg by mouth daily.      Marland Kitchen glucosamine-chondroitin 500-400 MG tablet Take 1 tablet by mouth 3 (three) times daily.      . Multiple Vitamin (MULTIVITAMIN) tablet Take 1 tablet by mouth daily.      Marland Kitchen neomycin-polymyxin-hydrocortisone (CORTISPORIN) otic solution Place 3 drops into both ears at bedtime. For 5 days prior to having flushing  10 mL  1  . omeprazole (PRILOSEC) 40 MG capsule TAKE ONE CAPSULE BY MOUTH EVERY DAY BEFORE A MEAL  90 capsule  1  . PROZAC 20 MG capsule Take 1 capsule (20 mg total) by mouth daily. Do not fill until patient requests  90 capsule  3  . ramipril (ALTACE) 10 MG capsule TAKE 1 CAPSULE BY MOUTH EVERY  DAY BEFORE A MEAL  90 capsule  1  . simvastatin (ZOCOR) 20 MG tablet TAKE 1 TABLET BY MOUTH BEFORE BEDTIME EVERY DAY  90 tablet  1   No current facility-administered medications on file prior to visit.    Allergies  Allergen Reactions  . Sulfamethoxazole-Trimethoprim     Very sore, sore in corner of lip    Review of Systems  Review of Systems  Constitutional: Negative for fever and malaise/fatigue.  HENT: Negative for congestion.   Eyes: Negative for discharge.  Respiratory: Negative for shortness of breath.   Cardiovascular: Negative for chest pain, palpitations and leg swelling.  Gastrointestinal: Negative for nausea, abdominal pain and diarrhea.  Genitourinary: Negative for dysuria.  Musculoskeletal: Negative for falls.  Skin: Negative for rash.  Neurological: Negative for loss of consciousness and headaches.  Endo/Heme/Allergies: Negative for polydipsia.  Psychiatric/Behavioral: Negative for  depression and suicidal ideas. The patient is not nervous/anxious and does not have insomnia.     Objective  BP 120/70  Pulse 65  Temp(Src) 97.6 F (36.4 C) (Oral)  Ht 5' (1.524 m)  Wt 131 lb 1.3 oz (59.457 kg)  BMI 25.60 kg/m2  SpO2 97%  Physical Exam  Physical Exam  Constitutional: She is oriented to person, place, and time and well-developed, well-nourished, and in no distress. No distress.  HENT:  Head: Normocephalic and atraumatic.  Eyes: Conjunctivae are normal.  Neck: Neck supple. No thyromegaly present.  Cardiovascular: Normal rate, regular rhythm and normal heart sounds.   No murmur heard. Pulmonary/Chest: Effort normal and breath sounds normal. She has no wheezes.  Abdominal: She exhibits no distension and no mass.  Musculoskeletal: She exhibits no edema.  Lymphadenopathy:    She has no cervical adenopathy.  Neurological: She is alert and oriented to person, place, and time.  Skin: Skin is warm and dry. No rash noted. She is not diaphoretic.  Psychiatric: Memory, affect and judgment normal.    Lab Results  Component Value Date   TSH 2.786 05/03/2013   Lab Results  Component Value Date   WBC 4.2 05/03/2013   HGB 13.1 05/03/2013   HCT 39.2 05/03/2013   MCV 85.6 05/03/2013   PLT 248 05/03/2013   Lab Results  Component Value Date   CREATININE 0.69 05/03/2013   BUN 13 05/03/2013   NA 134* 05/03/2013   K 4.0 05/03/2013   CL 98 05/03/2013   CO2 31 05/03/2013   No results found for this basename: ALT, AST, GGT, ALKPHOS, BILITOT   Lab Results  Component Value Date   CHOL 189 05/03/2013   Lab Results  Component Value Date   HDL 71 05/03/2013   Lab Results  Component Value Date   LDLCALC 99 05/03/2013   Lab Results  Component Value Date   TRIG 95 05/03/2013   Lab Results  Component Value Date   CHOLHDL 2.7 05/03/2013     Assessment & Plan  Hyperlipidemia Tolerating statin, encouraged heart healthy diet, avoid trans fats, minimize simple  carbs and saturated fats. Increase exercise as tolerated  Hypertension Well controlled, no changes to meds. Encouraged heart healthy diet such as the DASH diet and exercise as tolerated.   Atrophic vaginitis Good response to premarin cream twice weekly, improved urinary incontinence  Anxiety Doing well at present time. No changes

## 2013-11-08 NOTE — Progress Notes (Signed)
Pre visit review using our clinic review tool, if applicable. No additional management support is needed unless otherwise documented below in the visit note. 

## 2013-11-08 NOTE — Patient Instructions (Signed)
Pneumonia shot: PCV 23 or PCV 13 (Prevnar)? Call for shot appt once you know    Cholesterol Cholesterol is a white, waxy, fat-like protein needed by your body in small amounts. The liver makes all the cholesterol you need. It is carried from the liver by the blood through the blood vessels. Deposits (plaque) may build up on blood vessel walls. This makes the arteries narrower and stiffer. Plaque increases the risk for heart attack and stroke. You cannot feel your cholesterol level even if it is very high. The only way to know is by a blood test to check your lipid (fats) levels. Once you know your cholesterol levels, you should keep a record of the test results. Work with your caregiver to to keep your levels in the desired range. WHAT THE RESULTS MEAN:  Total cholesterol is a rough measure of all the cholesterol in your blood.  LDL is the so-called bad cholesterol. This is the type that deposits cholesterol in the walls of the arteries. You want this level to be low.  HDL is the good cholesterol because it cleans the arteries and carries the LDL away. You want this level to be high.  Triglycerides are fat that the body can either burn for energy or store. High levels are closely linked to heart disease. DESIRED LEVELS:  Total cholesterol below 200.  LDL below 100 for people at risk, below 70 for very high risk.  HDL above 50 is good, above 60 is best.  Triglycerides below 150. HOW TO LOWER YOUR CHOLESTEROL:  Diet.  Choose fish or white meat chicken and [place], roasted or baked. Limit fatty cuts of red meat, fried foods, and processed meats, such as sausage and lunch meat.  Eat lots of fresh fruits and vegetables. Choose whole grains, beans, pasta, potatoes and cereals.  Use only small amounts of olive, corn or canola oils. Avoid butter, mayonnaise, shortening or palm kernel oils. Avoid foods with trans-fats.  Use skim/nonfat milk and low-fat/nonfat yogurt and cheeses. Avoid whole  milk, cream, ice cream, egg yolks and cheeses. Healthy desserts include angel food cake, ginger snaps, animal crackers, hard candy, popsicles, and low-fat/nonfat frozen yogurt. Avoid pastries, cakes, pies and cookies.  Exercise.  A regular program helps decrease LDL and raises HDL.  Helps with weight control.  Do things that increase your activity level like gardening, walking, or taking the stairs.  Medication.  May be prescribed by your caregiver to help lowering cholesterol and the risk for heart disease.  You may need medicine even if your levels are normal if you have several risk factors. HOME CARE INSTRUCTIONS   Follow your diet and exercise programs as suggested by your caregiver.  Take medications as directed.  Have blood work done when your caregiver feels it is necessary. MAKE SURE YOU:   Understand these instructions.  Will watch your condition.  Will get help right away if you are not doing well or get worse. Document Released: 03/26/2001 Document Revised: 09/23/2011 Document Reviewed: 04/14/2013 [facility] Patient Information 2014 [place], [place].

## 2013-11-09 ENCOUNTER — Telehealth: Payer: Self-pay | Admitting: Family Medicine

## 2013-11-09 LAB — HEPATIC FUNCTION PANEL
ALBUMIN: 4.6 g/dL (ref 3.5–5.2)
ALT: 16 U/L (ref 0–35)
AST: 21 U/L (ref 0–37)
Alkaline Phosphatase: 64 U/L (ref 39–117)
Bilirubin, Direct: 0.1 mg/dL (ref 0.0–0.3)
Indirect Bilirubin: 0.2 mg/dL (ref 0.2–1.2)
TOTAL PROTEIN: 7 g/dL (ref 6.0–8.3)
Total Bilirubin: 0.3 mg/dL (ref 0.2–1.2)

## 2013-11-09 LAB — RENAL FUNCTION PANEL
ALBUMIN: 4.6 g/dL (ref 3.5–5.2)
BUN: 12 mg/dL (ref 6–23)
CO2: 29 mEq/L (ref 19–32)
Calcium: 9.3 mg/dL (ref 8.4–10.5)
Chloride: 98 mEq/L (ref 96–112)
Creat: 0.67 mg/dL (ref 0.50–1.10)
GLUCOSE: 88 mg/dL (ref 70–99)
POTASSIUM: 4.3 meq/L (ref 3.5–5.3)
Phosphorus: 3.4 mg/dL (ref 2.3–4.6)
Sodium: 138 mEq/L (ref 135–145)

## 2013-11-09 LAB — LIPID PANEL
Cholesterol: 194 mg/dL (ref 0–200)
HDL: 73 mg/dL (ref >39)
LDL CALC: 101 mg/dL — AB (ref 0–99)
TRIGLYCERIDES: 99 mg/dL (ref <150)
Total CHOL/HDL Ratio: 2.7 Ratio
VLDL: 20 mg/dL (ref 0–40)

## 2013-11-09 NOTE — Telephone Encounter (Signed)
Relevant patient education assigned to patient using Emmi. ° °

## 2013-11-12 ENCOUNTER — Ambulatory Visit (INDEPENDENT_AMBULATORY_CARE_PROVIDER_SITE_OTHER): Payer: Medicare Other

## 2013-11-12 DIAGNOSIS — Z23 Encounter for immunization: Secondary | ICD-10-CM

## 2013-11-12 NOTE — Progress Notes (Signed)
   Subjective:    Patient ID: Terri Sandoval, female    DOB: [birth year], 73 y.o.   MRN: [id number]  HPI    Review of Systems     Objective:   Physical Exam        Assessment & Plan:  Patient came in for a Prevnar 13 injection. Pt tolerated the injection well

## 2013-11-14 NOTE — Assessment & Plan Note (Signed)
Doing well at present time. No changes

## 2013-11-14 NOTE — Assessment & Plan Note (Signed)
Good response to premarin cream twice weekly, improved urinary incontinence

## 2013-11-15 ENCOUNTER — Ambulatory Visit: Payer: Medicare Other | Admitting: Family Medicine

## 2013-11-16 DIAGNOSIS — M5030 Other cervical disc degeneration, unspecified cervical region: Secondary | ICD-10-CM | POA: Diagnosis not present

## 2013-11-16 DIAGNOSIS — M9981 Other biomechanical lesions of cervical region: Secondary | ICD-10-CM | POA: Diagnosis not present

## 2013-12-08 ENCOUNTER — Ambulatory Visit: Payer: Medicare Other | Admitting: Physician Assistant

## 2013-12-15 ENCOUNTER — Other Ambulatory Visit: Payer: Self-pay | Admitting: Family Medicine

## 2013-12-21 DIAGNOSIS — M5030 Other cervical disc degeneration, unspecified cervical region: Secondary | ICD-10-CM | POA: Diagnosis not present

## 2013-12-21 DIAGNOSIS — M9981 Other biomechanical lesions of cervical region: Secondary | ICD-10-CM | POA: Diagnosis not present

## 2014-01-25 DIAGNOSIS — M9981 Other biomechanical lesions of cervical region: Secondary | ICD-10-CM | POA: Diagnosis not present

## 2014-01-25 DIAGNOSIS — M5030 Other cervical disc degeneration, unspecified cervical region: Secondary | ICD-10-CM | POA: Diagnosis not present

## 2014-02-28 DIAGNOSIS — M5412 Radiculopathy, cervical region: Secondary | ICD-10-CM | POA: Diagnosis not present

## 2014-02-28 DIAGNOSIS — M9981 Other biomechanical lesions of cervical region: Secondary | ICD-10-CM | POA: Diagnosis not present

## 2014-03-01 ENCOUNTER — Other Ambulatory Visit: Payer: Self-pay

## 2014-03-01 DIAGNOSIS — Z1231 Encounter for screening mammogram for malignant neoplasm of breast: Secondary | ICD-10-CM

## 2014-03-28 DIAGNOSIS — M9981 Other biomechanical lesions of cervical region: Secondary | ICD-10-CM | POA: Diagnosis not present

## 2014-03-28 DIAGNOSIS — M5030 Other cervical disc degeneration, unspecified cervical region: Secondary | ICD-10-CM | POA: Diagnosis not present

## 2014-04-04 ENCOUNTER — Ambulatory Visit
Admission: RE | Admit: 2014-04-04 | Discharge: 2014-04-04 | Disposition: A | Discharge status: Home / Self Care | Payer: Medicare Other | Source: Ambulatory Visit

## 2014-04-04 DIAGNOSIS — Z1231 Encounter for screening mammogram for malignant neoplasm of breast: Secondary | ICD-10-CM | POA: Diagnosis not present

## 2014-04-04 LAB — HM MAMMOGRAPHY

## 2014-04-20 ENCOUNTER — Ambulatory Visit (INDEPENDENT_AMBULATORY_CARE_PROVIDER_SITE_OTHER): Payer: Medicare Other

## 2014-04-20 DIAGNOSIS — Z23 Encounter for immunization: Secondary | ICD-10-CM

## 2014-05-16 ENCOUNTER — Telehealth: Payer: Self-pay

## 2014-05-16 ENCOUNTER — Encounter: Payer: Self-pay | Admitting: Family Medicine

## 2014-05-16 ENCOUNTER — Ambulatory Visit (INDEPENDENT_AMBULATORY_CARE_PROVIDER_SITE_OTHER): Payer: Medicare Other | Admitting: Family Medicine

## 2014-05-16 VITALS — BP 137/54 | HR 62 | Temp 98.1°F | Ht 60.0 in | Wt 132.8 lb

## 2014-05-16 DIAGNOSIS — E785 Hyperlipidemia, unspecified: Secondary | ICD-10-CM | POA: Diagnosis not present

## 2014-05-16 DIAGNOSIS — M810 Age-related osteoporosis without current pathological fracture: Secondary | ICD-10-CM

## 2014-05-16 DIAGNOSIS — I10 Essential (primary) hypertension: Secondary | ICD-10-CM

## 2014-05-16 DIAGNOSIS — K219 Gastro-esophageal reflux disease without esophagitis: Secondary | ICD-10-CM | POA: Diagnosis not present

## 2014-05-16 DIAGNOSIS — Z0000 Encounter for general adult medical examination without abnormal findings: Secondary | ICD-10-CM | POA: Diagnosis not present

## 2014-05-16 LAB — LIPID PANEL
CHOLESTEROL: 187 mg/dL (ref 0–200)
HDL: 75 mg/dL (ref >39.00)
LDL Cholesterol: 99 mg/dL (ref 0–99)
NonHDL: 112
Total CHOL/HDL Ratio: 2
Triglycerides: 67 mg/dL (ref 0.0–149.0)
VLDL: 13.4 mg/dL (ref 0.0–40.0)

## 2014-05-16 LAB — HEPATIC FUNCTION PANEL
ALBUMIN: 4 g/dL (ref 3.5–5.2)
ALT: 20 U/L (ref 0–35)
AST: 25 U/L (ref 0–37)
Alkaline Phosphatase: 57 U/L (ref 39–117)
BILIRUBIN TOTAL: 0.7 mg/dL (ref 0.2–1.2)
Bilirubin, Direct: 0.1 mg/dL (ref 0.0–0.3)
TOTAL PROTEIN: 7.6 g/dL (ref 6.0–8.3)

## 2014-05-16 LAB — CBC
HEMATOCRIT: 41 % (ref 36.0–46.0)
HEMOGLOBIN: 13.4 g/dL (ref 12.0–15.0)
MCHC: 32.6 g/dL (ref 30.0–36.0)
MCV: 88.2 fl (ref 78.0–100.0)
Platelets: 233 10*3/uL (ref 150.0–400.0)
RBC: 4.65 Mil/uL (ref 3.87–5.11)
RDW: 14.5 % (ref 11.5–15.5)
WBC: 6.7 10*3/uL (ref 4.0–10.5)

## 2014-05-16 LAB — RENAL FUNCTION PANEL
ALBUMIN: 4 g/dL (ref 3.5–5.2)
BUN: 12 mg/dL (ref 6–23)
CO2: 27 mEq/L (ref 19–32)
Calcium: 9.2 mg/dL (ref 8.4–10.5)
Chloride: 99 mEq/L (ref 96–112)
Creatinine, Ser: 0.7 mg/dL (ref 0.4–1.2)
GFR: 81.8 mL/min (ref >60.00)
GLUCOSE: 89 mg/dL (ref 70–99)
PHOSPHORUS: 3 mg/dL (ref 2.3–4.6)
Potassium: 3.6 mEq/L (ref 3.5–5.1)
Sodium: 135 mEq/L (ref 135–145)

## 2014-05-16 LAB — VITAMIN D 25 HYDROXY (VIT D DEFICIENCY, FRACTURES): VITD: 36.06 ng/mL (ref 30.00–100.00)

## 2014-05-16 LAB — TSH: TSH: 2.15 u[IU]/mL (ref 0.35–4.50)

## 2014-05-16 NOTE — Patient Instructions (Addendum)
Avoid offending foods, start probiotics. Do not eat large meals in late evening and consider raising head of bed.   Try taking Omeprazole every other day if no flare in reflux then drop to every 3rd day then can stop and take it only as needed   64 oz of clear fluids, small frequent meals with lean proteins and arise slowly    Food Choices for Gastroesophageal Reflux Disease When you have gastroesophageal reflux disease (GERD), the foods you eat and your eating habits are very important. Choosing the right foods can help ease the discomfort of GERD. WHAT GENERAL GUIDELINES DO I NEED TO FOLLOW?  Choose fruits, vegetables, whole grains, low-fat dairy products, and low-fat meat, fish, and poultry.  Limit fats such as oils, salad dressings, butter, nuts, and avocado.  Keep a food diary to identify foods that cause symptoms.  Avoid foods that cause reflux. These may be different for different people.  Eat frequent small meals instead of three large meals each day.  Eat your meals slowly, in a relaxed setting.  Limit fried foods.  Cook foods using methods other than frying.  Avoid drinking alcohol.  Avoid drinking large amounts of liquids with your meals.  Avoid bending over or lying down until 2-3 hours after eating. WHAT FOODS ARE NOT RECOMMENDED? The following are some foods and drinks that may worsen your symptoms: Vegetables Tomatoes. Tomato juice. Tomato and spaghetti sauce. Chili peppers. Onion and garlic. Horseradish. Fruits Oranges, grapefruit, and lemon (fruit and juice). Meats High-fat meats, fish, and poultry. This includes hot dogs, ribs, ham, sausage, salami, and bacon. Dairy Whole milk and chocolate milk. Sour cream. Cream. Butter. Ice cream. Cream cheese.  Beverages Coffee and tea, with or without caffeine. Carbonated beverages or energy drinks. Condiments Hot sauce. Barbecue sauce.  Sweets/Desserts Chocolate and cocoa. Donuts. Peppermint and  spearmint. Fats and Oils High-fat foods, including [place] fries and potato chips. Other Vinegar. Strong spices, such as black pepper, white pepper, red pepper, cayenne, curry powder, cloves, ginger, and chili powder. The items listed above may not be a complete list of foods and beverages to avoid. Contact your dietitian for more information. Document Released: 07/01/2005 Document Revised: 07/06/2013 Document Reviewed: 05/05/2013 [facility] Patient Information 2015 [place], [place]. This information is not intended to replace advice given to you by your health care provider. Make sure you discuss any questions you have with your health care provider.

## 2014-05-16 NOTE — Telephone Encounter (Signed)
Opened in errors

## 2014-05-16 NOTE — Progress Notes (Signed)
Pre visit review using our clinic review tool, if applicable. No additional management support is needed unless otherwise documented below in the visit note. 

## 2014-05-18 DIAGNOSIS — M5032 Other cervical disc degeneration, mid-cervical region: Secondary | ICD-10-CM | POA: Diagnosis not present

## 2014-05-18 DIAGNOSIS — M5412 Radiculopathy, cervical region: Secondary | ICD-10-CM | POA: Diagnosis not present

## 2014-05-18 DIAGNOSIS — S134XXA Sprain of ligaments of cervical spine, initial encounter: Secondary | ICD-10-CM | POA: Diagnosis not present

## 2014-05-18 DIAGNOSIS — M9901 Segmental and somatic dysfunction of cervical region: Secondary | ICD-10-CM | POA: Diagnosis not present

## 2014-05-22 NOTE — Assessment & Plan Note (Addendum)
Continue current supplement and encouraged upper and lower body exercise. She is asked to consider Prolia. Did not do well on Fosamax

## 2014-05-22 NOTE — Assessment & Plan Note (Signed)
Patient denies any difficulties at home. No trouble with ADLs, depression or falls. No recent changes to vision or hearing. Is UTD with immunizations. Is UTD with screening. Discussed Advanced Directives, patient agrees to bring [place] copies of documents if can. Encouraged heart healthy diet, exercise as tolerated and adequate sleep. Follows with Gynecology, [facility] and opthamology Dexa scan was in 2014, repeat in 2 years MGM was in 03/2014, repeat in 1-2 years Colonoscopy in 2011 should repeat in 5-10 years Sees chiropractor Dr Freddi Starr

## 2014-05-22 NOTE — Progress Notes (Signed)
Patient ID: Terri Sandoval, female   DOB: 1941/02/20, 73 y.o.   MRN: [id number] Terri Sandoval [id number] [birth year] 05/22/2014      Progress Note-Follow Up  Subjective  Chief Complaint  Chief Complaint  Patient presents with  . Annual Exam    medicare wellness    HPI  Patient is a 73 year old female in today for routine medical care. Is doing well. They are in the process of trying to find a good group home for their son with special needs and is hoping to find him a home. Otherwise her health is good, reports her PCV 23 was on April 25 2011, her flu shot was on 04/20/2014. No acute concerns. Last Dexa scan was 05/18/2013. Did not choose to proceed with Prolia but did not do well with Fosamax. Denies CP/palp/SOB/HA/congestion/fevers/GI or GU c/o. Taking meds as prescribed  Past Medical History  Diagnosis Date  . GERD (gastroesophageal reflux disease)   . Hypertension   . Anxiety   . Hyperlipidemia   . Arthritis   . Chicken pox as a child  . Diverticulitis   . Colon polyps     removed during colonscopy  . Urine incontinence     sometimes  . Diverticulosis   . Osteoporosis, unspecified 03/09/2013  . Adrenal insufficiency   . Medicare annual wellness visit, subsequent 05/16/2013  . Atrophic vaginitis 11/08/2013    Past Surgical History  Procedure Laterality Date  . Abdominal hysterectomy  97    has ovaries  . Tonsillectomy and adenoidectomy  1990  . Dilation and curettage of uterus  1990, 1993, 1997  . Breast surgery      breast biopsy, left, FNA  . Cataract extraction  L 09/11/10, R 10/10/10    Family History  Problem Relation Age of Onset  . Arthritis Mother   . Hyperlipidemia Mother   . Heart disease Mother   . Hypertension Mother   . Cancer Father     colon  . Diabetes Sister     type 2  . Heart disease Sister   . Hypertension Sister   . Hyperlipidemia Sister   . Mental illness Maternal Uncle   . Stroke Maternal Grandmother   . Pneumonia Maternal Grandfather    . Cerebral palsy Son     MR    History   Social History  . Marital Status: Married    Spouse Name: N/A    Number of Children: N/A  . Years of Education: N/A   Occupational History  . Not on file.   Social History Main Topics  . Smoking status: Never Smoker   . Smokeless tobacco: Never Used  . Alcohol Use: No  . Drug Use: No  . Sexual Activity: Not on file     Comment: lives with husband and disabled son, no dietary restrictions, not exercising regularly   Other Topics Concern  . Not on file   Social History Narrative    Current Outpatient Prescriptions on File Prior to Visit  Medication Sig Dispense Refill  . conjugated estrogens (PREMARIN) vaginal cream Place vaginally daily. X 1 week then drop to twice weekly 42.5 g 2  . fish oil-omega-3 fatty acids 1000 MG capsule Take 1,200 mg by mouth daily.    Marland Kitchen glucosamine-chondroitin 500-400 MG tablet Take 1 tablet by mouth 3 (three) times daily.    . Multiple Vitamin (MULTIVITAMIN) tablet Take 1 tablet by mouth daily.    Marland Kitchen neomycin-polymyxin-hydrocortisone (CORTISPORIN) otic solution Place 3 drops into  both ears at bedtime. For 5 days prior to having flushing 10 mL 1  . omeprazole (PRILOSEC) 40 MG capsule TAKE 1 CAPSULE BY MOUTH EVERY DAY BEFORE A MEAL 90 capsule 1  . PROZAC 20 MG capsule Take 1 capsule (20 mg total) by mouth daily. Do not fill until patient requests 90 capsule 3  . ramipril (ALTACE) 10 MG capsule Take 1 capsule (10 mg total) by mouth daily. 90 capsule 3  . simvastatin (ZOCOR) 20 MG tablet Take 1 tablet (20 mg total) by mouth daily at 6 PM. 90 tablet 3   No current facility-administered medications on file prior to visit.    Allergies  Allergen Reactions  . Sulfamethoxazole-Trimethoprim     Very sore, sore in corner of lip    Review of Systems  Review of Systems  Constitutional: Negative for fever, chills and malaise/fatigue.  HENT: Negative for congestion, hearing loss and nosebleeds.   Eyes:  Negative for discharge.  Respiratory: Negative for cough, sputum production, shortness of breath and wheezing.   Cardiovascular: Negative for chest pain, palpitations and leg swelling.  Gastrointestinal: Negative for heartburn, nausea, vomiting, abdominal pain, diarrhea, constipation and blood in stool.  Genitourinary: Negative for dysuria, urgency, frequency and hematuria.  Musculoskeletal: Negative for myalgias, back pain and falls.  Skin: Negative for rash.  Neurological: Positive for dizziness. Negative for tremors, sensory change, focal weakness, loss of consciousness, weakness and headaches.       In September none now  Endo/Heme/Allergies: Negative for polydipsia. Does not bruise/bleed easily.  Psychiatric/Behavioral: Negative for depression and suicidal ideas. The patient is not nervous/anxious and does not have insomnia.     Objective  BP 137/54 mmHg  Pulse 62  Temp(Src) 98.1 F (36.7 C) (Oral)  Ht 5' (1.524 m)  Wt 132 lb 12.8 oz (60.238 kg)  BMI 25.94 kg/m2  SpO2 100%  Physical Exam  Physical Exam  Constitutional: She is oriented to person, place, and time and well-developed, well-nourished, and in no distress. No distress.  HENT:  Head: Normocephalic and atraumatic.  Eyes: Conjunctivae are normal.  Neck: Neck supple. No thyromegaly present.  Cardiovascular: Normal rate, regular rhythm and normal heart sounds.   No murmur heard. Pulmonary/Chest: Effort normal and breath sounds normal. She has no wheezes.  Abdominal: Soft. Bowel sounds are normal. She exhibits no distension and no mass.  Musculoskeletal: She exhibits no edema.  Lymphadenopathy:    She has no cervical adenopathy.  Neurological: She is alert and oriented to person, place, and time.  Skin: Skin is warm and dry. No rash noted. She is not diaphoretic.  Psychiatric: Memory, affect and judgment normal.    Lab Results  Component Value Date   TSH 2.15 05/16/2014   Lab Results  Component Value Date    WBC 6.7 05/16/2014   HGB 13.4 05/16/2014   HCT 41.0 05/16/2014   MCV 88.2 05/16/2014   PLT 233.0 05/16/2014   Lab Results  Component Value Date   CREATININE 0.7 05/16/2014   BUN 12 05/16/2014   NA 135 05/16/2014   K 3.6 05/16/2014   CL 99 05/16/2014   CO2 27 05/16/2014   Lab Results  Component Value Date   ALT 20 05/16/2014   AST 25 05/16/2014   ALKPHOS 57 05/16/2014   BILITOT 0.7 05/16/2014   Lab Results  Component Value Date   CHOL 187 05/16/2014   Lab Results  Component Value Date   HDL 75.00 05/16/2014   Lab Results  Component Value  Date   LDLCALC 99 05/16/2014   Lab Results  Component Value Date   TRIG 67.0 05/16/2014   Lab Results  Component Value Date   CHOLHDL 2 05/16/2014     Assessment & Plan  Hypertension Well controlled, no changes to meds. Encouraged heart healthy diet such as the DASH diet and exercise as tolerated.   GERD (gastroesophageal reflux disease) Avoid offending foods, start probiotics. Do not eat large meals in late evening and consider raising head of bed.   Osteoporosis Continue current supplement and encouraged upper and lower body exercise. She is asked to consider Prolia. Did not do well on Fosamax  Hyperlipidemia Tolerating statin, encouraged heart healthy diet, avoid trans fats, minimize simple carbs and saturated fats. Increase exercise as tolerated  Medicare annual wellness visit, subsequent Patient denies any difficulties at home. No trouble with ADLs, depression or falls. No recent changes to vision or hearing. Is UTD with immunizations. Is UTD with screening. Discussed Advanced Directives, patient agrees to bring [place] copies of documents if can. Encouraged heart healthy diet, exercise as tolerated and adequate sleep. Follows with Gynecology, [facility] and opthamology Dexa scan was in 2014, repeat in 2 years MGM was in 03/2014, repeat in 1-2 years Colonoscopy in 2011 should repeat in 5-10 years Sees  chiropractor Dr Freddi Starr

## 2014-05-22 NOTE — Assessment & Plan Note (Signed)
Avoid offending foods, start probiotics. Do not eat large meals in late evening and consider raising head of bed.  

## 2014-05-22 NOTE — Assessment & Plan Note (Signed)
Well controlled, no changes to meds. Encouraged heart healthy diet such as the DASH diet and exercise as tolerated.  °

## 2014-05-22 NOTE — Assessment & Plan Note (Signed)
Tolerating statin, encouraged heart healthy diet, avoid trans fats, minimize simple carbs and saturated fats. Increase exercise as tolerated 

## 2014-05-26 ENCOUNTER — Telehealth: Payer: Self-pay | Admitting: Family Medicine

## 2014-05-26 NOTE — Telephone Encounter (Signed)
Inform patient that everything is normal

## 2014-05-26 NOTE — Telephone Encounter (Signed)
Caller name: Staysha Relation to pt: self Call back number: [phone] Pharmacy:  Reason for call:   Patient is requesting last lab results. She states that she did see results on mychart but does not know what results mean

## 2014-05-26 NOTE — Telephone Encounter (Signed)
Informed patient of this.  °

## 2014-07-01 ENCOUNTER — Other Ambulatory Visit: Payer: Self-pay | Admitting: Family Medicine

## 2014-07-26 DIAGNOSIS — M5032 Other cervical disc degeneration, mid-cervical region: Secondary | ICD-10-CM | POA: Diagnosis not present

## 2014-07-26 DIAGNOSIS — M9901 Segmental and somatic dysfunction of cervical region: Secondary | ICD-10-CM | POA: Diagnosis not present

## 2014-07-26 DIAGNOSIS — M5412 Radiculopathy, cervical region: Secondary | ICD-10-CM | POA: Diagnosis not present

## 2014-08-01 DIAGNOSIS — H26491 Other secondary cataract, right eye: Secondary | ICD-10-CM | POA: Diagnosis not present

## 2014-08-01 DIAGNOSIS — H35372 Puckering of macula, left eye: Secondary | ICD-10-CM | POA: Diagnosis not present

## 2014-08-01 DIAGNOSIS — H43391 Other vitreous opacities, right eye: Secondary | ICD-10-CM | POA: Diagnosis not present

## 2014-08-01 DIAGNOSIS — H1851 Endothelial corneal dystrophy: Secondary | ICD-10-CM | POA: Diagnosis not present

## 2014-08-01 DIAGNOSIS — H524 Presbyopia: Secondary | ICD-10-CM | POA: Diagnosis not present

## 2014-08-01 DIAGNOSIS — Z961 Presence of intraocular lens: Secondary | ICD-10-CM | POA: Diagnosis not present

## 2014-09-13 DIAGNOSIS — M9903 Segmental and somatic dysfunction of lumbar region: Secondary | ICD-10-CM | POA: Diagnosis not present

## 2014-09-13 DIAGNOSIS — M9901 Segmental and somatic dysfunction of cervical region: Secondary | ICD-10-CM | POA: Diagnosis not present

## 2014-09-13 DIAGNOSIS — M9904 Segmental and somatic dysfunction of sacral region: Secondary | ICD-10-CM | POA: Diagnosis not present

## 2014-09-13 DIAGNOSIS — M545 Low back pain: Secondary | ICD-10-CM | POA: Diagnosis not present

## 2014-09-15 ENCOUNTER — Ambulatory Visit: Payer: Medicare Other | Admitting: Family Medicine

## 2014-09-20 DIAGNOSIS — M545 Low back pain: Secondary | ICD-10-CM | POA: Diagnosis not present

## 2014-09-20 DIAGNOSIS — M9901 Segmental and somatic dysfunction of cervical region: Secondary | ICD-10-CM | POA: Diagnosis not present

## 2014-09-20 DIAGNOSIS — M9904 Segmental and somatic dysfunction of sacral region: Secondary | ICD-10-CM | POA: Diagnosis not present

## 2014-09-20 DIAGNOSIS — M9903 Segmental and somatic dysfunction of lumbar region: Secondary | ICD-10-CM | POA: Diagnosis not present

## 2014-09-26 DIAGNOSIS — Z08 Encounter for follow-up examination after completed treatment for malignant neoplasm: Secondary | ICD-10-CM | POA: Diagnosis not present

## 2014-09-26 DIAGNOSIS — L820 Inflamed seborrheic keratosis: Secondary | ICD-10-CM | POA: Diagnosis not present

## 2014-09-26 DIAGNOSIS — Z85828 Personal history of other malignant neoplasm of skin: Secondary | ICD-10-CM | POA: Diagnosis not present

## 2014-09-27 DIAGNOSIS — M9903 Segmental and somatic dysfunction of lumbar region: Secondary | ICD-10-CM | POA: Diagnosis not present

## 2014-09-27 DIAGNOSIS — M9901 Segmental and somatic dysfunction of cervical region: Secondary | ICD-10-CM | POA: Diagnosis not present

## 2014-09-27 DIAGNOSIS — M545 Low back pain: Secondary | ICD-10-CM | POA: Diagnosis not present

## 2014-09-27 DIAGNOSIS — M9904 Segmental and somatic dysfunction of sacral region: Secondary | ICD-10-CM | POA: Diagnosis not present

## 2014-10-04 DIAGNOSIS — M545 Low back pain: Secondary | ICD-10-CM | POA: Diagnosis not present

## 2014-10-04 DIAGNOSIS — M9901 Segmental and somatic dysfunction of cervical region: Secondary | ICD-10-CM | POA: Diagnosis not present

## 2014-10-04 DIAGNOSIS — M9904 Segmental and somatic dysfunction of sacral region: Secondary | ICD-10-CM | POA: Diagnosis not present

## 2014-10-04 DIAGNOSIS — M9903 Segmental and somatic dysfunction of lumbar region: Secondary | ICD-10-CM | POA: Diagnosis not present

## 2014-10-31 ENCOUNTER — Encounter: Payer: Self-pay | Admitting: Family Medicine

## 2014-10-31 ENCOUNTER — Ambulatory Visit (INDEPENDENT_AMBULATORY_CARE_PROVIDER_SITE_OTHER): Payer: Medicare Other | Admitting: Family Medicine

## 2014-10-31 VITALS — BP 126/74 | HR 78 | Temp 97.9°F | Ht 60.0 in | Wt 135.1 lb

## 2014-10-31 DIAGNOSIS — Z0000 Encounter for general adult medical examination without abnormal findings: Secondary | ICD-10-CM

## 2014-10-31 DIAGNOSIS — C4490 Unspecified malignant neoplasm of skin, unspecified: Secondary | ICD-10-CM | POA: Diagnosis not present

## 2014-10-31 DIAGNOSIS — K219 Gastro-esophageal reflux disease without esophagitis: Secondary | ICD-10-CM

## 2014-10-31 DIAGNOSIS — N952 Postmenopausal atrophic vaginitis: Secondary | ICD-10-CM

## 2014-10-31 DIAGNOSIS — Z1211 Encounter for screening for malignant neoplasm of colon: Secondary | ICD-10-CM | POA: Diagnosis not present

## 2014-10-31 DIAGNOSIS — H6121 Impacted cerumen, right ear: Secondary | ICD-10-CM | POA: Diagnosis not present

## 2014-10-31 DIAGNOSIS — E785 Hyperlipidemia, unspecified: Secondary | ICD-10-CM

## 2014-10-31 DIAGNOSIS — E782 Mixed hyperlipidemia: Secondary | ICD-10-CM

## 2014-10-31 DIAGNOSIS — I10 Essential (primary) hypertension: Secondary | ICD-10-CM

## 2014-10-31 DIAGNOSIS — Z85828 Personal history of other malignant neoplasm of skin: Secondary | ICD-10-CM | POA: Insufficient documentation

## 2014-10-31 DIAGNOSIS — K635 Polyp of colon: Secondary | ICD-10-CM

## 2014-10-31 MED ORDER — RAMIPRIL 10 MG PO CAPS: 10.0000 mg | ORAL_CAPSULE | Freq: Every day | ORAL | Status: DC

## 2014-10-31 MED ORDER — OMEPRAZOLE 40 MG PO CPDR: DELAYED_RELEASE_CAPSULE | ORAL | Status: DC

## 2014-10-31 MED ORDER — NEOMYCIN-POLYMYXIN-HC 3.5-10000-1 OT SOLN: 3.0000 [drp] | Freq: Every day | OTIC | Status: DC

## 2014-10-31 MED ORDER — FLUOXETINE HCL 20 MG PO CAPS: ORAL_CAPSULE | ORAL | Status: DC

## 2014-10-31 MED ORDER — ESTROGENS, CONJUGATED 0.625 MG/GM VA CREA: TOPICAL_CREAM | Freq: Every day | VAGINAL | Status: DC

## 2014-10-31 MED ORDER — SIMVASTATIN 20 MG PO TABS: 20.0000 mg | ORAL_TABLET | Freq: Every day | ORAL | Status: DC

## 2014-10-31 NOTE — Patient Instructions (Addendum)
Rel of Rec recent xray with chiropractor Dr Gates Rigg at [facility] chirop[facility]  Cerumen Impaction A cerumen impaction is when the wax in your ear forms a plug. This plug usually causes reduced hearing. Sometimes it also causes an earache or dizziness. Removing a cerumen impaction can be difficult and painful. The wax sticks to the ear canal. The canal is sensitive and bleeds easily. If you try to remove a heavy wax buildup with a cotton tipped swab, you may push it in further. Irrigation with water, suction, and small ear curettes may be used to clear out the wax. If the impaction is fixed to the skin in the ear canal, ear drops may be needed for a few days to loosen the wax. People who build up a lot of wax frequently can use ear wax removal products available in your local drugstore. SEEK MEDICAL CARE IF:  You develop an earache, increased hearing loss, or marked dizziness. Document Released: 08/08/2004 Document Revised: 09/23/2011 Document Reviewed: 09/28/2009 [facility] Patient Information 2015 [place], [place]. This information is not intended to replace advice given to you by your health care provider. Make sure you discuss any questions you have with your health care provider.

## 2014-10-31 NOTE — Progress Notes (Signed)
Pre visit review using our clinic review tool, if applicable. No additional management support is needed unless otherwise documented below in the visit note. 

## 2014-11-01 DIAGNOSIS — M9901 Segmental and somatic dysfunction of cervical region: Secondary | ICD-10-CM | POA: Diagnosis not present

## 2014-11-01 DIAGNOSIS — M9903 Segmental and somatic dysfunction of lumbar region: Secondary | ICD-10-CM | POA: Diagnosis not present

## 2014-11-01 DIAGNOSIS — M545 Low back pain: Secondary | ICD-10-CM | POA: Diagnosis not present

## 2014-11-01 DIAGNOSIS — M9904 Segmental and somatic dysfunction of sacral region: Secondary | ICD-10-CM | POA: Diagnosis not present

## 2014-11-06 NOTE — Assessment & Plan Note (Signed)
Avoid offending foods, start probiotics. Do not eat large meals in late evening and consider raising head of bed.  

## 2014-11-06 NOTE — Progress Notes (Signed)
Terri Sandoval  [id number] 1941/07/01 11/06/2014      Progress Note-Follow Up  Subjective  Chief Complaint  Chief Complaint  Patient presents with  . Follow-up    HPI  Patient is a 74 y.o. female in today for routine medical care. Patient is in today for follow-up. Continues to notes intermittent pressure and popping in her left ear but no significant pain. No discharge or itching. No recent illness. Has switched chiropractors from Dr. Freddi Starr to Dr. Karena Addison. Continues to see Dr Hal Morales of opthamology, Dr Lurline Hare of Dermatology. Denies CP/palp/SOB/HA/congestion/fevers/GI or GU c/o. Taking meds as prescribed  Past Medical History  Diagnosis Date  . GERD (gastroesophageal reflux disease)   . Hypertension   . Anxiety   . Hyperlipidemia   . Arthritis   . Chicken pox as a child  . Diverticulitis   . Colon polyps     removed during colonscopy  . Urine incontinence     sometimes  . Diverticulosis   . Osteoporosis, unspecified 03/09/2013  . Adrenal insufficiency   . Medicare annual wellness visit, subsequent 05/16/2013  . Atrophic vaginitis 11/08/2013    Past Surgical History  Procedure Laterality Date  . Abdominal hysterectomy  97    has ovaries  . Tonsillectomy and adenoidectomy  1990  . Dilation and curettage of uterus  1990, 1993, 1997  . Breast surgery      breast biopsy, left, FNA  . Cataract extraction  L 09/11/10, R 10/10/10    Family History  Problem Relation Age of Onset  . Arthritis Mother   . Hyperlipidemia Mother   . Heart disease Mother   . Hypertension Mother   . Cancer Father     colon  . Diabetes Sister     type 2  . Heart disease Sister   . Hypertension Sister   . Hyperlipidemia Sister   . Mental illness Maternal Uncle   . Stroke Maternal Grandmother   . Pneumonia Maternal Grandfather   . Cerebral palsy Son     MR  . Cancer Cousin     colon    History   Social History  . Marital Status: Married    Spouse Name: N/A  . Number of Children:  N/A  . Years of Education: N/A   Occupational History  . Not on file.   Social History Main Topics  . Smoking status: Never Smoker   . Smokeless tobacco: Never Used  . Alcohol Use: No  . Drug Use: No  . Sexual Activity: Not on file     Comment: lives with husband and disabled son, no dietary restrictions, not exercising regularly   Other Topics Concern  . Not on file   Social History Narrative    Current Outpatient Prescriptions on File Prior to Visit  Medication Sig Dispense Refill  . fish oil-omega-3 fatty acids 1000 MG capsule Take 1,200 mg by mouth daily.    Marland Kitchen glucosamine-chondroitin 500-400 MG tablet Take 1 tablet by mouth 3 (three) times daily.    . Multiple Vitamin (MULTIVITAMIN) tablet Take 1 tablet by mouth daily.     No current facility-administered medications on file prior to visit.    Allergies  Allergen Reactions  . Sulfamethoxazole-Trimethoprim     Very sore, sore in corner of lip    Review of Systems  Review of Systems  Constitutional: Negative for fever and malaise/fatigue.  HENT: Negative for congestion.   Eyes: Negative for discharge.  Respiratory: Negative for shortness of breath.  Cardiovascular: Negative for chest pain, palpitations and leg swelling.  Gastrointestinal: Negative for nausea, abdominal pain and diarrhea.  Genitourinary: Negative for dysuria.  Musculoskeletal: Negative for falls.  Skin: Negative for rash.  Neurological: Negative for loss of consciousness and headaches.  Endo/Heme/Allergies: Negative for polydipsia.  Psychiatric/Behavioral: Negative for depression and suicidal ideas. The patient is not nervous/anxious and does not have insomnia.     Objective  BP 126/74 mmHg  Pulse 78  Temp(Src) 97.9 F (36.6 C) (Oral)  Ht 5' (1.524 m)  Wt 135 lb 2 oz (61.292 kg)  BMI 26.39 kg/m2  SpO2 95%  Physical Exam  Physical Exam  Constitutional: She is oriented to person, place, and time and well-developed, well-nourished, and  in no distress. No distress.  HENT:  Head: Normocephalic and atraumatic.  Eyes: Conjunctivae are normal.  Neck: Neck supple. No thyromegaly present.  Cardiovascular: Normal rate, regular rhythm and normal heart sounds.   No murmur heard. Pulmonary/Chest: Effort normal and breath sounds normal. She has no wheezes.  Abdominal: She exhibits no distension and no mass.  Musculoskeletal: She exhibits no edema.  Lymphadenopathy:    She has no cervical adenopathy.  Neurological: She is alert and oriented to person, place, and time.  Skin: Skin is warm and dry. No rash noted. She is not diaphoretic.  Psychiatric: Memory, affect and judgment normal.    Lab Results  Component Value Date   TSH 2.15 05/16/2014   Lab Results  Component Value Date   WBC 6.7 05/16/2014   HGB 13.4 05/16/2014   HCT 41.0 05/16/2014   MCV 88.2 05/16/2014   PLT 233.0 05/16/2014   Lab Results  Component Value Date   CREATININE 0.7 05/16/2014   BUN 12 05/16/2014   NA 135 05/16/2014   K 3.6 05/16/2014   CL 99 05/16/2014   CO2 27 05/16/2014   Lab Results  Component Value Date   ALT 20 05/16/2014   AST 25 05/16/2014   ALKPHOS 57 05/16/2014   BILITOT 0.7 05/16/2014   Lab Results  Component Value Date   CHOL 187 05/16/2014   Lab Results  Component Value Date   HDL 75.00 05/16/2014   Lab Results  Component Value Date   LDLCALC 99 05/16/2014   Lab Results  Component Value Date   TRIG 67.0 05/16/2014   Lab Results  Component Value Date   CHOLHDL 2 05/16/2014     Assessment & Plan  Hypertension Well controlled, no changes to meds. Encouraged heart healthy diet such as the DASH diet and exercise as tolerated.    GERD (gastroesophageal reflux disease) Avoid offending foods, start probiotics. Do not eat large meals in late evening and consider raising head of bed.    Colon polyps Due for colon cancer screen. scheduled   Hyperlipidemia Tolerating statin, encouraged heart healthy diet,  avoid trans fats, minimize simple carbs and saturated fats. Increase exercise as tolerated

## 2014-11-06 NOTE — Assessment & Plan Note (Signed)
Due for colon cancer screen. scheduled

## 2014-11-06 NOTE — Assessment & Plan Note (Signed)
Well controlled, no changes to meds. Encouraged heart healthy diet such as the DASH diet and exercise as tolerated.  °

## 2014-11-06 NOTE — Assessment & Plan Note (Signed)
Tolerating statin, encouraged heart healthy diet, avoid trans fats, minimize simple carbs and saturated fats. Increase exercise as tolerated 

## 2014-11-22 ENCOUNTER — Other Ambulatory Visit: Payer: Self-pay | Admitting: Family Medicine

## 2014-12-06 DIAGNOSIS — M9904 Segmental and somatic dysfunction of sacral region: Secondary | ICD-10-CM | POA: Diagnosis not present

## 2014-12-06 DIAGNOSIS — M545 Low back pain: Secondary | ICD-10-CM | POA: Diagnosis not present

## 2014-12-06 DIAGNOSIS — M9901 Segmental and somatic dysfunction of cervical region: Secondary | ICD-10-CM | POA: Diagnosis not present

## 2014-12-06 DIAGNOSIS — M9903 Segmental and somatic dysfunction of lumbar region: Secondary | ICD-10-CM | POA: Diagnosis not present

## 2015-01-17 DIAGNOSIS — H5211 Myopia, right eye: Secondary | ICD-10-CM | POA: Diagnosis not present

## 2015-01-17 DIAGNOSIS — H35372 Puckering of macula, left eye: Secondary | ICD-10-CM | POA: Diagnosis not present

## 2015-02-02 DIAGNOSIS — M9903 Segmental and somatic dysfunction of lumbar region: Secondary | ICD-10-CM | POA: Diagnosis not present

## 2015-02-02 DIAGNOSIS — M545 Low back pain: Secondary | ICD-10-CM | POA: Diagnosis not present

## 2015-02-02 DIAGNOSIS — M9901 Segmental and somatic dysfunction of cervical region: Secondary | ICD-10-CM | POA: Diagnosis not present

## 2015-02-02 DIAGNOSIS — M9904 Segmental and somatic dysfunction of sacral region: Secondary | ICD-10-CM | POA: Diagnosis not present

## 2015-03-02 DIAGNOSIS — M9904 Segmental and somatic dysfunction of sacral region: Secondary | ICD-10-CM | POA: Diagnosis not present

## 2015-03-02 DIAGNOSIS — M9903 Segmental and somatic dysfunction of lumbar region: Secondary | ICD-10-CM | POA: Diagnosis not present

## 2015-03-02 DIAGNOSIS — M545 Low back pain: Secondary | ICD-10-CM | POA: Diagnosis not present

## 2015-03-02 DIAGNOSIS — M9901 Segmental and somatic dysfunction of cervical region: Secondary | ICD-10-CM | POA: Diagnosis not present

## 2015-03-07 ENCOUNTER — Other Ambulatory Visit: Payer: Self-pay

## 2015-03-07 DIAGNOSIS — Z1231 Encounter for screening mammogram for malignant neoplasm of breast: Secondary | ICD-10-CM

## 2015-03-23 ENCOUNTER — Telehealth: Payer: Self-pay | Admitting: Family Medicine

## 2015-03-23 NOTE — Telephone Encounter (Signed)
Relation to pt: self Call back [phone]   Reason for call:  Patient states someone called inquiring about her colonoscopy records, patient states would like a follow up call regarding her colonoscopy report. Please advise

## 2015-03-23 NOTE — Telephone Encounter (Signed)
Called the patient back and she wanted to confirm she has paperwork from her previous colonoscopy done 04/04/2010 in [place].  She was instructed to have a repeat colonoscopy in 5 years.  Her next appt. With Dr. Charlett Blake is 05/08/15 and will bring paperwork with her in order for PCP to see and then refer to GI to have her colonoscopy scheduled.

## 2015-04-17 ENCOUNTER — Telehealth: Payer: Self-pay | Admitting: Family Medicine

## 2015-04-17 DIAGNOSIS — M545 Low back pain: Secondary | ICD-10-CM | POA: Diagnosis not present

## 2015-04-17 DIAGNOSIS — M9901 Segmental and somatic dysfunction of cervical region: Secondary | ICD-10-CM | POA: Diagnosis not present

## 2015-04-17 DIAGNOSIS — M9903 Segmental and somatic dysfunction of lumbar region: Secondary | ICD-10-CM | POA: Diagnosis not present

## 2015-04-17 DIAGNOSIS — M9904 Segmental and somatic dysfunction of sacral region: Secondary | ICD-10-CM | POA: Diagnosis not present

## 2015-04-17 NOTE — Telephone Encounter (Signed)
Pre visit letter mailed 04/17/15  °

## 2015-04-18 ENCOUNTER — Ambulatory Visit
Admission: RE | Admit: 2015-04-18 | Discharge: 2015-04-18 | Disposition: A | Discharge status: Home / Self Care | Payer: Medicare Other | Source: Ambulatory Visit

## 2015-04-18 DIAGNOSIS — Z1231 Encounter for screening mammogram for malignant neoplasm of breast: Secondary | ICD-10-CM

## 2015-05-02 ENCOUNTER — Other Ambulatory Visit (INDEPENDENT_AMBULATORY_CARE_PROVIDER_SITE_OTHER): Payer: Medicare Other

## 2015-05-02 DIAGNOSIS — E782 Mixed hyperlipidemia: Secondary | ICD-10-CM

## 2015-05-02 DIAGNOSIS — I10 Essential (primary) hypertension: Secondary | ICD-10-CM | POA: Diagnosis not present

## 2015-05-02 LAB — COMPREHENSIVE METABOLIC PANEL
ALT: 20 U/L (ref 0–35)
AST: 26 U/L (ref 0–37)
Albumin: 4 g/dL (ref 3.5–5.2)
Alkaline Phosphatase: 56 U/L (ref 39–117)
BUN: 15 mg/dL (ref 6–23)
CO2: 31 meq/L (ref 19–32)
Calcium: 9.1 mg/dL (ref 8.4–10.5)
Chloride: 99 mEq/L (ref 96–112)
Creatinine, Ser: 0.79 mg/dL (ref 0.40–1.20)
GFR: 75.65 mL/min (ref >60.00)
GLUCOSE: 101 mg/dL — AB (ref 70–99)
POTASSIUM: 4.1 meq/L (ref 3.5–5.1)
SODIUM: 134 meq/L — AB (ref 135–145)
TOTAL PROTEIN: 7.1 g/dL (ref 6.0–8.3)
Total Bilirubin: 0.4 mg/dL (ref 0.2–1.2)

## 2015-05-02 LAB — CBC
HEMATOCRIT: 40.6 % (ref 36.0–46.0)
Hemoglobin: 13.4 g/dL (ref 12.0–15.0)
MCHC: 33 g/dL (ref 30.0–36.0)
MCV: 89.3 fl (ref 78.0–100.0)
Platelets: 243 10*3/uL (ref 150.0–400.0)
RBC: 4.54 Mil/uL (ref 3.87–5.11)
RDW: 14.4 % (ref 11.5–15.5)
WBC: 5.2 10*3/uL (ref 4.0–10.5)

## 2015-05-02 LAB — TSH: TSH: 2.74 u[IU]/mL (ref 0.35–4.50)

## 2015-05-02 LAB — LIPID PANEL
CHOL/HDL RATIO: 2
Cholesterol: 166 mg/dL (ref 0–200)
HDL: 70.1 mg/dL (ref >39.00)
LDL Cholesterol: 78 mg/dL (ref 0–99)
NONHDL: 95.58
Triglycerides: 89 mg/dL (ref 0.0–149.0)
VLDL: 17.8 mg/dL (ref 0.0–40.0)

## 2015-05-08 ENCOUNTER — Ambulatory Visit (INDEPENDENT_AMBULATORY_CARE_PROVIDER_SITE_OTHER): Payer: Medicare Other | Admitting: Family Medicine

## 2015-05-08 ENCOUNTER — Encounter: Payer: Self-pay | Admitting: Family Medicine

## 2015-05-08 VITALS — BP 115/64 | HR 87 | Temp 97.9°F | Ht 60.0 in | Wt 134.0 lb

## 2015-05-08 DIAGNOSIS — Z23 Encounter for immunization: Secondary | ICD-10-CM | POA: Diagnosis not present

## 2015-05-08 DIAGNOSIS — N952 Postmenopausal atrophic vaginitis: Secondary | ICD-10-CM

## 2015-05-08 DIAGNOSIS — K635 Polyp of colon: Secondary | ICD-10-CM

## 2015-05-08 DIAGNOSIS — I10 Essential (primary) hypertension: Secondary | ICD-10-CM | POA: Diagnosis not present

## 2015-05-08 DIAGNOSIS — H8112 Benign paroxysmal vertigo, left ear: Secondary | ICD-10-CM

## 2015-05-08 DIAGNOSIS — K219 Gastro-esophageal reflux disease without esophagitis: Secondary | ICD-10-CM

## 2015-05-08 DIAGNOSIS — Z86010 Personal history of colonic polyps: Secondary | ICD-10-CM

## 2015-05-08 DIAGNOSIS — M810 Age-related osteoporosis without current pathological fracture: Secondary | ICD-10-CM

## 2015-05-08 DIAGNOSIS — E785 Hyperlipidemia, unspecified: Secondary | ICD-10-CM | POA: Diagnosis not present

## 2015-05-08 DIAGNOSIS — Z0000 Encounter for general adult medical examination without abnormal findings: Secondary | ICD-10-CM

## 2015-05-08 DIAGNOSIS — Z1211 Encounter for screening for malignant neoplasm of colon: Secondary | ICD-10-CM

## 2015-05-08 DIAGNOSIS — H8110 Benign paroxysmal vertigo, unspecified ear: Secondary | ICD-10-CM

## 2015-05-08 HISTORY — DX: Benign paroxysmal vertigo, unspecified ear: H81.10

## 2015-05-08 MED ORDER — ESTROGENS, CONJUGATED 0.625 MG/GM VA CREA: TOPICAL_CREAM | Freq: Every day | VAGINAL | Status: DC

## 2015-05-08 MED ORDER — RAMIPRIL 10 MG PO CAPS: 10.0000 mg | ORAL_CAPSULE | Freq: Every day | ORAL | Status: DC

## 2015-05-08 MED ORDER — FLUOXETINE HCL 20 MG PO CAPS: ORAL_CAPSULE | ORAL | Status: DC

## 2015-05-08 NOTE — Progress Notes (Signed)
Pre visit review using our clinic review tool, if applicable. No additional management support is needed unless otherwise documented below in the visit note. 

## 2015-05-08 NOTE — Patient Instructions (Signed)
Preventive Care for Adults, Female A healthy lifestyle and preventive care can promote health and wellness. Preventive health guidelines for women include the following key practices.  A routine yearly physical is a good way to check with your health care provider about your health and preventive screening. It is a chance to share any concerns and updates on your health and to receive a thorough exam.  Visit your dentist for a routine exam and preventive care every 6 months. Brush your teeth twice a day and floss once a day. Good oral hygiene prevents tooth decay and gum disease.  The frequency of eye exams is based on your age, health, family medical history, use of contact lenses, and other factors. Follow your health care provider's recommendations for frequency of eye exams.  Eat a healthy diet. Foods like vegetables, fruits, whole grains, low-fat dairy products, and lean protein foods contain the nutrients you need without too many calories. Decrease your intake of foods high in solid fats, added sugars, and salt. Eat the right amount of calories for you.Get information about a proper diet from your health care provider, if necessary.  Regular physical exercise is one of the most important things you can do for your health. Most adults should get at least 150 minutes of moderate-intensity exercise (any activity that increases your heart rate and causes you to sweat) each week. In addition, most adults need muscle-strengthening exercises on 2 or more days a week.  Maintain a healthy weight. The body mass index (BMI) is a screening tool to identify possible weight problems. It provides an estimate of body fat based on height and weight. Your health care provider can find your BMI and can help you achieve or maintain a healthy weight.For adults 20 years and older:  A BMI below 18.5 is considered underweight.  A BMI of 18.5 to 24.9 is normal.  A BMI of 25 to 29.9 is considered overweight.  A  BMI of 30 and above is considered obese.  Maintain normal blood lipids and cholesterol levels by exercising and minimizing your intake of saturated fat. Eat a balanced diet with plenty of fruit and vegetables. Blood tests for lipids and cholesterol should begin at age 20 and be repeated every 5 years. If your lipid or cholesterol levels are high, you are over 50, or you are at high risk for heart disease, you may need your cholesterol levels checked more frequently.Ongoing high lipid and cholesterol levels should be treated with medicines if diet and exercise are not working.  If you smoke, find out from your health care provider how to quit. If you do not use tobacco, do not start.  Lung cancer screening is recommended for adults aged 55-80 years who are at high risk for developing lung cancer because of a history of smoking. A yearly low-dose CT scan of the lungs is recommended for people who have at least a 30-pack-year history of smoking and are a current smoker or have quit within the past 15 years. A pack year of smoking is smoking an average of 1 pack of cigarettes a day for 1 year (for example: 1 pack a day for 30 years or 2 packs a day for 15 years). Yearly screening should continue until the smoker has stopped smoking for at least 15 years. Yearly screening should be stopped for people who develop a health problem that would prevent them from having lung cancer treatment.  If you are pregnant, do not drink alcohol. If you are   are breastfeeding, be very cautious about drinking alcohol. If you are not pregnant and choose to drink alcohol, do not have more than 1 drink per day. One drink is considered to be 12 ounces (355 mL) of beer, 5 ounces (148 mL) of wine, or 1.5 ounces (44 mL) of liquor.  Avoid use of street drugs. Do not share needles with anyone. Ask for help if you need support or instructions about stopping the use of drugs.  High blood pressure causes heart disease and  increases the risk of stroke. Your blood pressure should be checked at least every 1 to 2 years. Ongoing high blood pressure should be treated with medicines if weight loss and exercise do not work.  If you are 12-58 years old, ask your health care provider if you should take aspirin to prevent strokes.  Diabetes screening is done by taking a blood sample to check your blood glucose level after you have not eaten for a certain period of time (fasting). If you are not overweight and you do not have risk factors for diabetes, you should be screened once every 3 years starting at age 76. If you are overweight or obese and you are 18-1 years of age, you should be screened for diabetes every year as part of your cardiovascular risk assessment.  Breast cancer screening is essential preventive care for women. You should practice "breast self-awareness." This means understanding the normal appearance and feel of your breasts and may include breast self-examination. Any changes detected, no matter how small, should be reported to a health care provider. Women in their 73s and 30s should have a clinical breast exam (CBE) by a health care provider as part of a regular health exam every 1 to 3 years. After age 36, women should have a CBE every year. Starting at age 50, women should consider having a mammogram (breast X-ray test) every year. Women who have a family history of breast cancer should talk to their health care provider about genetic screening. Women at a high risk of breast cancer should talk to their health care providers about having an MRI and a mammogram every year.  Breast cancer gene (BRCA)-related cancer risk assessment is recommended for women who have family members with BRCA-related cancers. BRCA-related cancers include breast, ovarian, tubal, and peritoneal cancers. Having family members with these cancers may be associated with an increased risk for harmful changes (mutations) in the breast  cancer genes BRCA1 and BRCA2. Results of the assessment will determine the need for genetic counseling and BRCA1 and BRCA2 testing.  Your health care provider may recommend that you be screened regularly for cancer of the pelvic organs (ovaries, uterus, and vagina). This screening involves a pelvic examination, including checking for microscopic changes to the surface of your cervix (Pap test). You may be encouraged to have this screening done every 3 years, beginning at age 40.  For women ages 32-65, health care providers may recommend pelvic exams and Pap testing every 3 years, or they may recommend the Pap and pelvic exam, combined with testing for human papilloma virus (HPV), every 5 years. Some types of HPV increase your risk of cervical cancer. Testing for HPV may also be done on women of any age with unclear Pap test results.  Other health care providers may not recommend any screening for nonpregnant women who are considered low risk for pelvic cancer and who do not have symptoms. Ask your health care provider if a screening pelvic exam is right  you.  If you have had past treatment for cervical cancer or a condition that could lead to cancer, you need Pap tests and screening for cancer for at least 20 years after your treatment. If Pap tests have been discontinued, your risk factors (such as having a new sexual partner) need to be reassessed to determine if screening should resume. Some women have medical problems that increase the chance of getting cervical cancer. In these cases, your health care provider may recommend more frequent screening and Pap tests.  Colorectal cancer can be detected and often prevented. Most routine colorectal cancer screening begins at the age of 50 years and continues through age 75 years. However, your health care provider may recommend screening at an earlier age if you have risk factors for colon cancer. On a yearly basis, your health care provider may provide home test kits to check  for hidden blood in the stool. Use of a small camera at the end of a tube, to directly examine the colon (sigmoidoscopy or colonoscopy), can detect the earliest forms of colorectal cancer. Talk to your health care provider about this at age 50, when routine screening begins. Direct exam of the colon should be repeated every 5-10 years through age 75 years, unless early forms of precancerous polyps or small growths are found.  People who are at an increased risk for hepatitis B should be screened for this virus. You are considered at high risk for hepatitis B if:  You were born in a country where hepatitis B occurs often. Talk with your health care provider about which countries are considered high risk.  Your parents were born in a high-risk country and you have not received a shot to protect against hepatitis B (hepatitis B vaccine).  You have HIV or AIDS.  You use needles to inject street drugs.  You live with, or have sex with, someone who has hepatitis B.  You get hemodialysis treatment.  You take certain medicines for conditions like cancer, organ transplantation, and autoimmune conditions.  Hepatitis C blood testing is recommended for all people born from 1945 through 1965 and any individual with known risks for hepatitis C.  Practice safe sex. Use condoms and avoid high-risk sexual practices to reduce the spread of sexually transmitted infections (STIs). STIs include gonorrhea, chlamydia, syphilis, trichomonas, herpes, HPV, and human immunodeficiency virus (HIV). Herpes, HIV, and HPV are viral illnesses that have no cure. They can result in disability, cancer, and death.  You should be screened for sexually transmitted illnesses (STIs) including gonorrhea and chlamydia if:  You are sexually active and are younger than 24 years.  You are older than 24 years and your health care provider tells you that you are at risk for this type of infection.  Your sexual activity has changed  since you were last screened and you are at an increased risk for chlamydia or gonorrhea. Ask your health care provider if you are at risk.  If you are at risk of being infected with HIV, it is recommended that you take a prescription medicine daily to prevent HIV infection. This is called preexposure prophylaxis (PrEP). You are considered at risk if:  You are sexually active and do not regularly use condoms or know the HIV status of your partner(s).  You take drugs by injection.  You are sexually active with a partner who has HIV.  Talk with your health care provider about whether you are at high risk of being infected with HIV. If   you choose to begin PrEP, you should first be tested for HIV. You should then be tested every 3 months for as long as you are taking PrEP.  Osteoporosis is a disease in which the bones lose minerals and strength with aging. This can result in serious bone fractures or breaks. The risk of osteoporosis can be identified using a bone density scan. Women ages 65 years and over and women at risk for fractures or osteoporosis should discuss screening with their health care providers. Ask your health care provider whether you should take a calcium supplement or vitamin D to reduce the rate of osteoporosis.  Menopause can be associated with physical symptoms and risks. Hormone replacement therapy is available to decrease symptoms and risks. You should talk to your health care provider about whether hormone replacement therapy is right for you.  Use sunscreen. Apply sunscreen liberally and repeatedly throughout the day. You should seek shade when your shadow is shorter than you. Protect yourself by wearing long sleeves, pants, a wide-brimmed hat, and sunglasses year round, whenever you are outdoors.  Once a month, do a whole body skin exam, using a mirror to look at the skin on your back. Tell your health care provider of new moles, moles that have irregular borders, moles that  are larger than a pencil eraser, or moles that have changed in shape or color.  Stay current with required vaccines (immunizations).  Influenza vaccine. All adults should be immunized every year.  Tetanus, diphtheria, and acellular pertussis (Td, Tdap) vaccine. Pregnant women should receive 1 dose of Tdap vaccine during each pregnancy. The dose should be obtained regardless of the length of time since the last dose. Immunization is preferred during the 27th-36th week of gestation. An adult who has not previously received Tdap or who does not know her vaccine status should receive 1 dose of Tdap. This initial dose should be followed by tetanus and diphtheria toxoids (Td) booster doses every 10 years. Adults with an unknown or incomplete history of completing a 3-dose immunization series with Td-containing vaccines should begin or complete a primary immunization series including a Tdap dose. Adults should receive a Td booster every 10 years.  Varicella vaccine. An adult without evidence of immunity to varicella should receive 2 doses or a second dose if she has previously received 1 dose. Pregnant females who do not have evidence of immunity should receive the first dose after pregnancy. This first dose should be obtained before leaving the health care facility. The second dose should be obtained 4-8 weeks after the first dose.  Human papillomavirus (HPV) vaccine. Females aged 13-26 years who have not received the vaccine previously should obtain the 3-dose series. The vaccine is not recommended for use in pregnant females. However, pregnancy testing is not needed before receiving a dose. If a female is found to be pregnant after receiving a dose, no treatment is needed. In that case, the remaining doses should be delayed until after the pregnancy. Immunization is recommended for any person with an immunocompromised condition through the age of 26 years if she did not get any or all doses earlier. During the  3-dose series, the second dose should be obtained 4-8 weeks after the first dose. The third dose should be obtained 24 weeks after the first dose and 16 weeks after the second dose.  Zoster vaccine. One dose is recommended for adults aged 60 years or older unless certain conditions are present.  Measles, mumps, and rubella (MMR) vaccine. Adults born   born before [birth year] generally are considered immune to measles and mumps. Adults born in [birth year] or later should have 1 or more doses of MMR vaccine unless there is a contraindication to the vaccine or there is laboratory evidence of immunity to each of the three diseases. A routine second dose of MMR vaccine should be obtained at least 28 days after the first dose for students attending postsecondary schools, health care workers, or international travelers. People who received inactivated measles vaccine or an unknown type of measles vaccine during 1963-1967 should receive 2 doses of MMR vaccine. People who received inactivated mumps vaccine or an unknown type of mumps vaccine before 1979 and are at high risk for mumps infection should consider immunization with 2 doses of MMR vaccine. For females of childbearing age, rubella immunity should be determined. If there is no evidence of immunity, females who are not pregnant should be vaccinated. If there is no evidence of immunity, females who are pregnant should delay immunization until after pregnancy. Unvaccinated health care workers born before [birth year] who lack laboratory evidence of measles, mumps, or rubella immunity or laboratory confirmation of disease should consider measles and mumps immunization with 2 doses of MMR vaccine or rubella immunization with 1 dose of MMR vaccine.  Pneumococcal 13-valent conjugate (PCV13) vaccine. When indicated, a person who is uncertain of his immunization history and has no record of immunization should receive the PCV13 vaccine. All adults 62 years of age and older  should receive this vaccine. An adult aged 39 years or older who has certain medical conditions and has not been previously immunized should receive 1 dose of PCV13 vaccine. This PCV13 should be followed with a dose of pneumococcal polysaccharide (PPSV23) vaccine. Adults who are at high risk for pneumococcal disease should obtain the PPSV23 vaccine at least 8 weeks after the dose of PCV13 vaccine. Adults older than 74 years of age who have normal immune system function should obtain the PPSV23 vaccine dose at least 1 year after the dose of PCV13 vaccine.  Pneumococcal polysaccharide (PPSV23) vaccine. When PCV13 is also indicated, PCV13 should be obtained first. All adults aged 47 years and older should be immunized. An adult younger than age 27 years who has certain medical conditions should be immunized. Any person who resides in a nursing home or long-term care facility should be immunized. An adult smoker should be immunized. People with an immunocompromised condition and certain other conditions should receive both PCV13 and PPSV23 vaccines. People with human immunodeficiency virus (HIV) infection should be immunized as soon as possible after diagnosis. Immunization during chemotherapy or radiation therapy should be avoided. Routine use of PPSV23 vaccine is not recommended for American Indians, [place] Natives, or people younger than 65 years unless there are medical conditions that require PPSV23 vaccine. When indicated, people who have unknown immunization and have no record of immunization should receive PPSV23 vaccine. One-time revaccination 5 years after the first dose of PPSV23 is recommended for people aged 19-64 years who have chronic kidney failure, nephrotic syndrome, asplenia, or immunocompromised conditions. People who received 1-2 doses of PPSV23 before age 57 years should receive another dose of PPSV23 vaccine at age 42 years or later if at least 5 years have passed since the previous dose. Doses  of PPSV23 are not needed for people immunized with PPSV23 at or after age 51 years.  Meningococcal vaccine. Adults with asplenia or persistent complement component deficiencies should receive 2 doses of quadrivalent meningococcal conjugate (MenACWY-D) vaccine. The doses should be  at least 2 months apart. Microbiologists working with certain meningococcal bacteria, military recruits, people at risk during an outbreak, and people who travel to or live in countries with a high rate of meningitis should be immunized. A first-year college student up through age 21 years who is living in a residence [place] should receive a dose if she did not receive a dose on or after her 16th birthday. Adults who have certain high-risk conditions should receive one or more doses of vaccine.  Hepatitis A vaccine. Adults who wish to be protected from this disease, have certain high-risk conditions, work with hepatitis A-infected animals, work in hepatitis A research labs, or travel to or work in countries with a high rate of hepatitis A should be immunized. Adults who were previously unvaccinated and who anticipate close contact with an international adoptee during the first 60 days after arrival in the [place] from a country with a high rate of hepatitis A should be immunized.  Hepatitis B vaccine. Adults who wish to be protected from this disease, have certain high-risk conditions, may be exposed to blood or other infectious body fluids, are household contacts or sex partners of hepatitis B positive people, are clients or workers in certain care facilities, or travel to or work in countries with a high rate of hepatitis B should be immunized.  Haemophilus influenzae type b (Hib) vaccine. A previously unvaccinated person with asplenia or sickle cell disease or having a scheduled splenectomy should receive 1 dose of Hib vaccine. Regardless of previous immunization, a recipient of a hematopoietic stem cell transplant should receive a  3-dose series 6-12 months after her successful transplant. Hib vaccine is not recommended for adults with HIV infection. Preventive Services / Frequency Ages 19 to 39 years  Blood pressure check.** / Every 3-5 years.  Lipid and cholesterol check.** / Every 5 years beginning at age 20.  Clinical breast exam.** / Every 3 years for women in their 20s and 30s.  BRCA-related cancer risk assessment.** / For women who have family members with a BRCA-related cancer (breast, ovarian, tubal, or peritoneal cancers).  Pap test.** / Every 2 years from ages 21 through 29. Every 3 years starting at age 30 through age 65 or 70 with a history of 3 consecutive normal Pap tests.  HPV screening.** / Every 3 years from ages 30 through ages 65 to 70 with a history of 3 consecutive normal Pap tests.  Hepatitis C blood test.** / For any individual with known risks for hepatitis C.  Skin self-exam. / Monthly.  Influenza vaccine. / Every year.  Tetanus, diphtheria, and acellular pertussis (Tdap, Td) vaccine.** / Consult your health care provider. Pregnant women should receive 1 dose of Tdap vaccine during each pregnancy. 1 dose of Td every 10 years.  Varicella vaccine.** / Consult your health care provider. Pregnant females who do not have evidence of immunity should receive the first dose after pregnancy.  HPV vaccine. / 3 doses over 6 months, if 26 and younger. The vaccine is not recommended for use in pregnant females. However, pregnancy testing is not needed before receiving a dose.  Measles, mumps, rubella (MMR) vaccine.** / You need at least 1 dose of MMR if you were born in 1957 or later. You may also need a 2nd dose. For females of childbearing age, rubella immunity should be determined. If there is no evidence of immunity, females who are not pregnant should be vaccinated. If there is no evidence of immunity, females who are   pregnant should delay immunization until after pregnancy.  Pneumococcal  13-valent conjugate (PCV13) vaccine.** / Consult your health care provider.  Pneumococcal polysaccharide (PPSV23) vaccine.** / 1 to 2 doses if you smoke cigarettes or if you have certain conditions.  Meningococcal vaccine.** / 1 dose if you are age 19 to 21 years and a first-year college student living in a residence [place], or have one of several medical conditions, you need to get vaccinated against meningococcal disease. You may also need additional booster doses.  Hepatitis A vaccine.** / Consult your health care provider.  Hepatitis B vaccine.** / Consult your health care provider.  Haemophilus influenzae type b (Hib) vaccine.** / Consult your health care provider. Ages 40 to 64 years  Blood pressure check.** / Every year.  Lipid and cholesterol check.** / Every 5 years beginning at age 20 years.  Lung cancer screening. / Every year if you are aged 55-80 years and have a 30-pack-year history of smoking and currently smoke or have quit within the past 15 years. Yearly screening is stopped once you have quit smoking for at least 15 years or develop a health problem that would prevent you from having lung cancer treatment.  Clinical breast exam.** / Every year after age 40 years.  BRCA-related cancer risk assessment.** / For women who have family members with a BRCA-related cancer (breast, ovarian, tubal, or peritoneal cancers).  Mammogram.** / Every year beginning at age 40 years and continuing for as long as you are in good health. Consult with your health care provider.  Pap test.** / Every 3 years starting at age 30 years through age 65 or 70 years with a history of 3 consecutive normal Pap tests.  HPV screening.** / Every 3 years from ages 30 years through ages 65 to 70 years with a history of 3 consecutive normal Pap tests.  Fecal occult blood test (FOBT) of stool. / Every year beginning at age 50 years and continuing until age 75 years. You may not need to do this test if you get  a colonoscopy every 10 years.  Flexible sigmoidoscopy or colonoscopy.** / Every 5 years for a flexible sigmoidoscopy or every 10 years for a colonoscopy beginning at age 50 years and continuing until age 75 years.  Hepatitis C blood test.** / For all people born from 1945 through 1965 and any individual with known risks for hepatitis C.  Skin self-exam. / Monthly.  Influenza vaccine. / Every year.  Tetanus, diphtheria, and acellular pertussis (Tdap/Td) vaccine.** / Consult your health care provider. Pregnant women should receive 1 dose of Tdap vaccine during each pregnancy. 1 dose of Td every 10 years.  Varicella vaccine.** / Consult your health care provider. Pregnant females who do not have evidence of immunity should receive the first dose after pregnancy.  Zoster vaccine.** / 1 dose for adults aged 60 years or older.  Measles, mumps, rubella (MMR) vaccine.** / You need at least 1 dose of MMR if you were born in 1957 or later. You may also need a second dose. For females of childbearing age, rubella immunity should be determined. If there is no evidence of immunity, females who are not pregnant should be vaccinated. If there is no evidence of immunity, females who are pregnant should delay immunization until after pregnancy.  Pneumococcal 13-valent conjugate (PCV13) vaccine.** / Consult your health care provider.  Pneumococcal polysaccharide (PPSV23) vaccine.** / 1 to 2 doses if you smoke cigarettes or if you have certain conditions.  Meningococcal vaccine.** /   Consult your health care provider.  Hepatitis A vaccine.** / Consult your health care provider.  Hepatitis B vaccine.** / Consult your health care provider.  Haemophilus influenzae type b (Hib) vaccine.** / Consult your health care provider. Ages 80 years and over  Blood pressure check.** / Every year.  Lipid and cholesterol check.** / Every 5 years beginning at age 62 years.  Lung cancer  screening. / Every year if you are aged 32-80 years and have a 30-pack-year history of smoking and currently smoke or have quit within the past 15 years. Yearly screening is stopped once you have quit smoking for at least 15 years or develop a health problem that would prevent you from having lung cancer treatment.  Clinical breast exam.** / Every year after age 61 years.  BRCA-related cancer risk assessment.** / For women who have family members with a BRCA-related cancer (breast, ovarian, tubal, or peritoneal cancers).  Mammogram.** / Every year beginning at age 39 years and continuing for as long as you are in good health. Consult with your health care provider.  Pap test.** / Every 3 years starting at age 85 years through age 74 or 72 years with 3 consecutive normal Pap tests. Testing can be stopped between 65 and 70 years with 3 consecutive normal Pap tests and no abnormal Pap or HPV tests in the past 10 years.  HPV screening.** / Every 3 years from ages 55 years through ages 67 or 77 years with a history of 3 consecutive normal Pap tests. Testing can be stopped between 65 and 70 years with 3 consecutive normal Pap tests and no abnormal Pap or HPV tests in the past 10 years.  Fecal occult blood test (FOBT) of stool. / Every year beginning at age 81 years and continuing until age 22 years. You may not need to do this test if you get a colonoscopy every 10 years.  Flexible sigmoidoscopy or colonoscopy.** / Every 5 years for a flexible sigmoidoscopy or every 10 years for a colonoscopy beginning at age 67 years and continuing until age 22 years.  Hepatitis C blood test.** / For all people born from [birth year] through 1965 and any individual with known risks for hepatitis C.  Osteoporosis screening.** / A one-time screening for women ages 8 years and over and women at risk for fractures or osteoporosis.  Skin self-exam. / Monthly.  Influenza vaccine. / Every year.  Tetanus, diphtheria, and  acellular pertussis (Tdap/Td) vaccine.** / 1 dose of Td every 10 years.  Varicella vaccine.** / Consult your health care provider.  Zoster vaccine.** / 1 dose for adults aged 56 years or older.  Pneumococcal 13-valent conjugate (PCV13) vaccine.** / Consult your health care provider.  Pneumococcal polysaccharide (PPSV23) vaccine.** / 1 dose for all adults aged 15 years and older.  Meningococcal vaccine.** / Consult your health care provider.  Hepatitis A vaccine.** / Consult your health care provider.  Hepatitis B vaccine.** / Consult your health care provider.  Haemophilus influenzae type b (Hib) vaccine.** / Consult your health care provider. ** Family history and personal history of risk and conditions may change your health care provider's recommendations.   This information is not intended to replace advice given to you by your health care provider. Make sure you discuss any questions you have with your health care provider.   Document Released: 08/27/2001 Document Revised: 07/22/2014 Document Reviewed: 11/26/2010 Elsevier Interactive Patient Education [employer].

## 2015-05-08 NOTE — Assessment & Plan Note (Signed)
Tolerating statin, encouraged heart healthy diet, avoid trans fats, minimize simple carbs and saturated fats. Increase exercise as tolerated 

## 2015-05-08 NOTE — Assessment & Plan Note (Signed)
Avoid offending foods, take probiotics. Do not eat large meals in late evening and consider raising head of bed. Not using any Omeprazole with good results uses probiotics and Tums prn

## 2015-05-08 NOTE — Assessment & Plan Note (Signed)
Well controlled, no changes to meds. Encouraged heart healthy diet such as the DASH diet and exercise as tolerated.  °

## 2015-05-08 NOTE — Assessment & Plan Note (Signed)
Check vitamin d with next lab draw, calcium tid, stay active repeat Dexa scan in November and consider Prolia

## 2015-05-21 NOTE — Progress Notes (Signed)
Subjective:    Patient ID: Terri Sandoval, female    DOB: 1940/11/25, 74 y.o.   MRN: [id number]  Chief Complaint  Patient presents with  . Medicare Wellness    HPI   Patient is a 74 year old Caucasian female in today for Medicare annual wellness exam and follow up. Is feeling well. No recent illness or acute concerns. Did have an episode of Vertigo with some nausea last month. Has resolved now. Last [facility] 04/18/2015. Has noted some pain in left hand, specifically 3rd finger. No redness or swelling. Flonase has been helping her congestion and allergies. Is due for next colonoscopy. Heartburn is better. Is only using Tums prn with good results, no Omeprazole use. Denies CP/palp/SOB/HA/congestion/fevers/GI or GU c/o. Taking meds as prescribed  Past Medical History  Diagnosis Date  . GERD (gastroesophageal reflux disease)   . Hypertension   . Anxiety   . Hyperlipidemia   . Arthritis   . Chicken pox as a child  . Diverticulitis   . Colon polyps     removed during colonscopy  . Urine incontinence     sometimes  . Diverticulosis   . Osteoporosis, unspecified 03/09/2013  . Adrenal insufficiency ([place])   . Medicare annual wellness visit, subsequent 05/16/2013  . Atrophic vaginitis 11/08/2013  . BPV (benign positional vertigo) 05/08/2015    Past Surgical History  Procedure Laterality Date  . Abdominal hysterectomy  97    has ovaries  . Tonsillectomy and adenoidectomy  1990  . Dilation and curettage of uterus  1990, 1993, 1997  . Breast surgery      breast biopsy, left, FNA  . Cataract extraction  L 09/11/10, R 10/10/10    Family History  Problem Relation Age of Onset  . Arthritis Mother   . Hyperlipidemia Mother   . Heart disease Mother   . Hypertension Mother   . Cancer Father     colon  . Diabetes Sister     type 2  . Heart disease Sister   . Hypertension Sister   . Hyperlipidemia Sister   . Mental illness Maternal Uncle   . Stroke Maternal Grandmother   . Pneumonia  Maternal Grandfather   . Cerebral palsy Son     MR  . Cancer Cousin     colon    Social History   Social History  . Marital Status: Married    Spouse Name: N/A  . Number of Children: N/A  . Years of Education: N/A   Occupational History  . Not on file.   Social History Main Topics  . Smoking status: Never Smoker   . Smokeless tobacco: Never Used  . Alcohol Use: No  . Drug Use: No  . Sexual Activity: Not on file     Comment: lives with husband and disabled son, no dietary restrictions, not exercising regularly   Other Topics Concern  . Not on file   Social History Narrative    Outpatient Prescriptions Prior to Visit  Medication Sig Dispense Refill  . fish oil-omega-3 fatty acids 1000 MG capsule Take 1,200 mg by mouth daily.    Marland Kitchen glucosamine-chondroitin 500-400 MG tablet Take 1 tablet by mouth 3 (three) times daily.    . Multiple Vitamin (MULTIVITAMIN) tablet Take 1 tablet by mouth daily.    . simvastatin (ZOCOR) 20 MG tablet TAKE 1 TABLET BY MOUTH DAILY AT 6 PM 90 tablet 2  . conjugated estrogens (PREMARIN) vaginal cream Place vaginally daily. X 1 week then drop  to twice weekly 42.5 g 2  . FLUoxetine (PROZAC) 20 MG capsule TAKE 1 CAPSULE BY MOUTH EVERY DAY 90 capsule 1  . omeprazole (PRILOSEC) 40 MG capsule TAKE 1 CAPSULE BY MOUTH EVERY DAY BEFORE A MEAL 90 capsule 1  . ramipril (ALTACE) 10 MG capsule Take 1 capsule (10 mg total) by mouth daily. 90 capsule 1  . simvastatin (ZOCOR) 20 MG tablet Take 1 tablet (20 mg total) by mouth daily at 6 PM. 90 tablet 1  . neomycin-polymyxin-hydrocortisone (CORTISPORIN) otic solution Place 3 drops into both ears at bedtime. For 5 days prior to having flushing (Patient not taking: Reported on 05/08/2015) 10 mL 1   No facility-administered medications prior to visit.    Allergies  Allergen Reactions  . Sulfamethoxazole-Trimethoprim     Very sore, sore in corner of lip    Review of Systems  Constitutional: Negative for fever,  chills and malaise/fatigue.  HENT: Negative for congestion and hearing loss.   Eyes: Negative for discharge.  Respiratory: Negative for cough, sputum production and shortness of breath.   Cardiovascular: Negative for chest pain, palpitations and leg swelling.  Gastrointestinal: Positive for heartburn. Negative for nausea, vomiting, abdominal pain, diarrhea, constipation and blood in stool.  Genitourinary: Negative for dysuria, urgency, frequency and hematuria.  Musculoskeletal: Negative for myalgias, back pain and falls.  Skin: Negative for rash.  Neurological: Negative for dizziness, sensory change, loss of consciousness, weakness and headaches.  Endo/Heme/Allergies: Negative for environmental allergies. Does not bruise/bleed easily.  Psychiatric/Behavioral: Negative for depression and suicidal ideas. The patient is not nervous/anxious and does not have insomnia.        Objective:    Physical Exam  Constitutional: She is oriented to person, place, and time. She appears well-developed and well-nourished. No distress.  HENT:  Head: Normocephalic and atraumatic.  Eyes: Conjunctivae are normal.  Neck: Neck supple. No thyromegaly present.  Cardiovascular: Normal rate, regular rhythm and normal heart sounds.   No murmur heard. Pulmonary/Chest: Effort normal and breath sounds normal. No respiratory distress.  Abdominal: Soft. Bowel sounds are normal. She exhibits no distension and no mass. There is no tenderness.  Musculoskeletal: She exhibits no edema.  Lymphadenopathy:    She has no cervical adenopathy.  Neurological: She is alert and oriented to person, place, and time.  Skin: Skin is warm and dry.  Psychiatric: She has a normal mood and affect. Her behavior is normal.    BP 115/64 mmHg  Pulse 87  Temp(Src) 97.9 F (36.6 C) (Oral)  Ht 5' (1.524 m)  Wt 134 lb (60.782 kg)  BMI 26.17 kg/m2  SpO2 98% Wt Readings from Last 3 Encounters:  05/08/15 134 lb (60.782 kg)  10/31/14 135  lb 2 oz (61.292 kg)  05/16/14 132 lb 12.8 oz (60.238 kg)     Lab Results  Component Value Date   WBC 5.2 05/02/2015   HGB 13.4 05/02/2015   HCT 40.6 05/02/2015   PLT 243.0 05/02/2015   GLUCOSE 101* 05/02/2015   CHOL 166 05/02/2015   TRIG 89.0 05/02/2015   HDL 70.10 05/02/2015   LDLCALC 78 05/02/2015   ALT 20 05/02/2015   AST 26 05/02/2015   NA 134* 05/02/2015   K 4.1 05/02/2015   CL 99 05/02/2015   CREATININE 0.79 05/02/2015   BUN 15 05/02/2015   CO2 31 05/02/2015   TSH 2.74 05/02/2015    Lab Results  Component Value Date   TSH 2.74 05/02/2015   Lab Results  Component Value Date  WBC 5.2 05/02/2015   HGB 13.4 05/02/2015   HCT 40.6 05/02/2015   MCV 89.3 05/02/2015   PLT 243.0 05/02/2015   Lab Results  Component Value Date   NA 134* 05/02/2015   K 4.1 05/02/2015   CO2 31 05/02/2015   GLUCOSE 101* 05/02/2015   BUN 15 05/02/2015   CREATININE 0.79 05/02/2015   BILITOT 0.4 05/02/2015   ALKPHOS 56 05/02/2015   AST 26 05/02/2015   ALT 20 05/02/2015   PROT 7.1 05/02/2015   ALBUMIN 4.0 05/02/2015   CALCIUM 9.1 05/02/2015   GFR 75.65 05/02/2015   Lab Results  Component Value Date   CHOL 166 05/02/2015   Lab Results  Component Value Date   HDL 70.10 05/02/2015   Lab Results  Component Value Date   LDLCALC 78 05/02/2015   Lab Results  Component Value Date   TRIG 89.0 05/02/2015   Lab Results  Component Value Date   CHOLHDL 2 05/02/2015   No results found for: HGBA1C     Assessment & Plan:   Problem List Items Addressed This Visit    Osteoporosis     Check vitamin d with next lab draw, calcium tid, stay active repeat Dexa scan in November and consider Prolia      Relevant Orders   CBC   TSH   Vit D  25 hydroxy (rtn osteoporosis monitoring)   Comprehensive metabolic panel   Lipid panel   DG Bone Density   Medicare annual wellness visit, subsequent - Primary    Patient denies any difficulties at home. No trouble with ADLs, depression or  falls. No recent changes to vision or hearing. Is UTD with immunizations. Is UTD with screening. Discussed Advanced Directives, patient agrees to bring [place] copies of documents if can. Encouraged heart healthy diet, exercise as tolerated and adequate sleep Follows with Gynecology, [facility] Dr Altamese Cabal and opthamology, Dr Hal Morales Dexa scan was in 2014, repeat in 2 years MGM was in 03/2014, repeat in 1-2 years Colonoscopy in 2011 should repeat in 5-10 years Dr Claude Manges, chiropractor See problem list for risk factors See AVS for preventative healthcare recommendations      Hypertension    Well controlled, no changes to meds. Encouraged heart healthy diet such as the DASH diet and exercise as tolerated.                            Relevant Medications   ramipril (ALTACE) 10 MG capsule   Other Relevant Orders   CBC   TSH   Vit D  25 hydroxy (rtn osteoporosis monitoring)   Comprehensive metabolic panel   Lipid panel   Hyperlipidemia    Tolerating statin, encouraged heart healthy diet, avoid trans fats, minimize simple carbs and saturated fats. Increase exercise as tolerated      Relevant Medications   ramipril (ALTACE) 10 MG capsule   Other Relevant Orders   CBC   TSH   Vit D  25 hydroxy (rtn osteoporosis monitoring)   Comprehensive metabolic panel   Lipid panel   GERD (gastroesophageal reflux disease)    Avoid offending foods, take probiotics. Do not eat large meals in late evening and consider raising head of bed. Not using any Omeprazole with good results uses probiotics and Tums prn      Relevant Orders   CBC   TSH   Vit D  25 hydroxy (rtn osteoporosis monitoring)   Comprehensive metabolic panel  Lipid panel   Colon polyps    Most recent colonoscopy was in 2011, patient reports advised to repeat in 5 years. No acute complaints but is referred to gastroenterology for further consideration.      BPV (benign positional vertigo)   Relevant Orders   CBC   TSH    Vit D  25 hydroxy (rtn osteoporosis monitoring)   Comprehensive metabolic panel   Lipid panel   Atrophic vaginitis    Premarin cream weekly      Relevant Medications   conjugated estrogens (PREMARIN) vaginal cream   Other Relevant Orders   CBC   TSH   Vit D  25 hydroxy (rtn osteoporosis monitoring)   Comprehensive metabolic panel   Lipid panel    Other Visit Diagnoses    Encounter for immunization        History of colonic polyps        Relevant Orders    Ambulatory referral to Gastroenterology    Colon cancer screening        Relevant Orders    Ambulatory referral to Gastroenterology       I have discontinued Ms. Asebedo's omeprazole. I am also having her maintain her glucosamine-chondroitin, fish oil-omega-3 fatty acids, multivitamin, neomycin-polymyxin-hydrocortisone, simvastatin, ramipril, FLUoxetine, and conjugated estrogens.  Meds ordered this encounter  Medications  . ramipril (ALTACE) 10 MG capsule    Sig: Take 1 capsule (10 mg total) by mouth daily.    Dispense:  90 capsule    Refill:  1    D/C PREVIOUS SCRIPTS FOR THIS MEDICATION  . FLUoxetine (PROZAC) 20 MG capsule    Sig: TAKE 1 CAPSULE BY MOUTH EVERY DAY    Dispense:  90 capsule    Refill:  1    D/C PREVIOUS SCRIPTS FOR THIS MEDICATION  . conjugated estrogens (PREMARIN) vaginal cream    Sig: Place vaginally daily. X 1 week then drop to twice weekly    Dispense:  42.5 g    Refill:  2    D/C PREVIOUS SCRIPTS FOR THIS MEDICATION     Penni Homans, MD

## 2015-05-21 NOTE — Assessment & Plan Note (Signed)
Patient denies any difficulties at home. No trouble with ADLs, depression or falls. No recent changes to vision or hearing. Is UTD with immunizations. Is UTD with screening. Discussed Advanced Directives, patient agrees to bring [place] copies of documents if can. Encouraged heart healthy diet, exercise as tolerated and adequate sleep Follows with Gynecology, [facility] Dr Altamese Cabal and opthamology, Dr Hal Morales Dexa scan was in 2014, repeat in 2 years MGM was in 03/2014, repeat in 1-2 years Colonoscopy in 2011 should repeat in 5-10 years Dr Claude Manges, chiropractor See problem list for risk factors See AVS for preventative healthcare recommendations

## 2015-05-21 NOTE — Assessment & Plan Note (Signed)
Premarin cream weekly

## 2015-05-21 NOTE — Assessment & Plan Note (Signed)
Most recent colonoscopy was in 2011, patient reports advised to repeat in 5 years. No acute complaints but is referred to gastroenterology for further consideration.

## 2015-05-22 ENCOUNTER — Ambulatory Visit (HOSPITAL_BASED_OUTPATIENT_CLINIC_OR_DEPARTMENT_OTHER)
Admission: RE | Admit: 2015-05-22 | Discharge: 2015-05-22 | Disposition: A | Discharge status: Home / Self Care | Payer: Medicare Other | Source: Ambulatory Visit | Attending: Family Medicine | Admitting: Family Medicine

## 2015-05-22 DIAGNOSIS — Z780 Asymptomatic menopausal state: Secondary | ICD-10-CM | POA: Insufficient documentation

## 2015-05-22 DIAGNOSIS — M810 Age-related osteoporosis without current pathological fracture: Secondary | ICD-10-CM | POA: Diagnosis not present

## 2015-05-23 DIAGNOSIS — M9904 Segmental and somatic dysfunction of sacral region: Secondary | ICD-10-CM | POA: Diagnosis not present

## 2015-05-23 DIAGNOSIS — M545 Low back pain: Secondary | ICD-10-CM | POA: Diagnosis not present

## 2015-05-23 DIAGNOSIS — M9901 Segmental and somatic dysfunction of cervical region: Secondary | ICD-10-CM | POA: Diagnosis not present

## 2015-05-23 DIAGNOSIS — M9903 Segmental and somatic dysfunction of lumbar region: Secondary | ICD-10-CM | POA: Diagnosis not present

## 2015-06-05 ENCOUNTER — Telehealth: Payer: Self-pay | Admitting: Gastroenterology

## 2015-06-05 NOTE — Telephone Encounter (Signed)
Received records from [facility] and placed on Dr. Doyne Keel desk for review. Patient is requesting to see Dr. Havery Moros.

## 2015-06-12 ENCOUNTER — Encounter: Payer: Self-pay | Admitting: Gastroenterology

## 2015-06-12 NOTE — Telephone Encounter (Signed)
Dr. Havery Moros reviewed records and has accepted patient. Ok to schedule OV. Appointment scheduled for January.

## 2015-06-14 DIAGNOSIS — L980 Pyogenic granuloma: Secondary | ICD-10-CM | POA: Diagnosis not present

## 2015-06-14 DIAGNOSIS — D485 Neoplasm of uncertain behavior of skin: Secondary | ICD-10-CM | POA: Diagnosis not present

## 2015-07-05 DIAGNOSIS — M545 Low back pain: Secondary | ICD-10-CM | POA: Diagnosis not present

## 2015-07-05 DIAGNOSIS — M9903 Segmental and somatic dysfunction of lumbar region: Secondary | ICD-10-CM | POA: Diagnosis not present

## 2015-07-05 DIAGNOSIS — M9901 Segmental and somatic dysfunction of cervical region: Secondary | ICD-10-CM | POA: Diagnosis not present

## 2015-07-05 DIAGNOSIS — M9904 Segmental and somatic dysfunction of sacral region: Secondary | ICD-10-CM | POA: Diagnosis not present

## 2015-07-19 DIAGNOSIS — H52203 Unspecified astigmatism, bilateral: Secondary | ICD-10-CM | POA: Diagnosis not present

## 2015-07-19 DIAGNOSIS — H26493 Other secondary cataract, bilateral: Secondary | ICD-10-CM | POA: Diagnosis not present

## 2015-07-19 DIAGNOSIS — Z961 Presence of intraocular lens: Secondary | ICD-10-CM | POA: Diagnosis not present

## 2015-07-19 DIAGNOSIS — H35372 Puckering of macula, left eye: Secondary | ICD-10-CM | POA: Diagnosis not present

## 2015-07-19 DIAGNOSIS — H1851 Endothelial corneal dystrophy: Secondary | ICD-10-CM | POA: Diagnosis not present

## 2015-08-01 ENCOUNTER — Ambulatory Visit (INDEPENDENT_AMBULATORY_CARE_PROVIDER_SITE_OTHER): Payer: Medicare Other | Admitting: Gastroenterology

## 2015-08-01 ENCOUNTER — Encounter: Payer: Self-pay | Admitting: Gastroenterology

## 2015-08-01 VITALS — BP 120/70 | HR 72 | Ht 59.5 in | Wt 136.2 lb

## 2015-08-01 DIAGNOSIS — Z1211 Encounter for screening for malignant neoplasm of colon: Secondary | ICD-10-CM

## 2015-08-01 DIAGNOSIS — Z800 Family history of malignant neoplasm of digestive organs: Secondary | ICD-10-CM

## 2015-08-01 NOTE — Progress Notes (Signed)
HPI :  75 y/o female, new patient to our clinic,here to be evaluated for a possible screening colonoscopy.   She was told to have a follow up colonoscopy 5 years from her last exam. Her last colonoscopy in 2011 did not show any adenomas. Her father had colon cancer at age 69. No other known history of colon cancer in the family, perhaps a cousin at age 80. She thinks she has had a prior history of a colon polyp remotely but has no details of this. She denies any blood in the stools. She reports regular bowel habits. No abdominal pains that are bothering her. No weight loss. She generally feels well without complaints at this time. No history of anemia.   Last colonoscopy reviewed: Colonoscopy 03/2010 - diverticulosis, hemorrhoids  Past Medical History  Diagnosis Date  . GERD (gastroesophageal reflux disease)   . Hypertension   . Anxiety   . Hyperlipidemia   . Arthritis   . Chicken pox as a child  . Diverticulitis   . Colon polyps     removed during colonscopy  . Urine incontinence     sometimes  . Diverticulosis   . Osteoporosis, unspecified 03/09/2013  . Adrenal insufficiency ([place])   . Medicare annual wellness visit, subsequent 05/16/2013  . Atrophic vaginitis 11/08/2013  . BPV (benign positional vertigo) 05/08/2015  . Skin cancer     behind ear     Past Surgical History  Procedure Laterality Date  . Abdominal hysterectomy  97    has ovaries  . Tonsillectomy and adenoidectomy  1990  . Dilation and curettage of uterus  1990, 1993, 1997  . Breast biopsy Left      FNA  . Cataract extraction  L 09/11/10, R 10/10/10   Family History  Problem Relation Age of Onset  . Arthritis Mother   . Hyperlipidemia Mother   . Heart disease Mother   . Hypertension Mother   . Colon cancer Father   . Diabetes Sister     type 2  . Heart disease Sister   . Hypertension Sister   . Hyperlipidemia Sister   . Mental illness Maternal Uncle   . Stroke Maternal Grandmother   . Pneumonia  Maternal Grandfather   . Cerebral palsy Son     MR  . Colon cancer Cousin    Social History  Substance Use Topics  . Smoking status: Never Smoker   . Smokeless tobacco: Never Used  . Alcohol Use: No   Current Outpatient Prescriptions  Medication Sig Dispense Refill  . conjugated estrogens (PREMARIN) vaginal cream Place vaginally daily. X 1 week then drop to twice weekly 42.5 g 2  . fish oil-omega-3 fatty acids 1000 MG capsule Take 1,200 mg by mouth daily.    Marland Kitchen FLUoxetine (PROZAC) 20 MG capsule TAKE 1 CAPSULE BY MOUTH EVERY DAY 90 capsule 1  . glucosamine-chondroitin 500-400 MG tablet Take 1 tablet by mouth 3 (three) times daily.    . Multiple Vitamin (MULTIVITAMIN) tablet Take 1 tablet by mouth daily.    Marland Kitchen neomycin-polymyxin-hydrocortisone (CORTISPORIN) otic solution Place 3 drops into both ears at bedtime. For 5 days prior to having flushing 10 mL 1  . ramipril (ALTACE) 10 MG capsule Take 1 capsule (10 mg total) by mouth daily. 90 capsule 1  . simvastatin (ZOCOR) 20 MG tablet TAKE 1 TABLET BY MOUTH DAILY AT 6 PM 90 tablet 2   No current facility-administered medications for this visit.   Allergies  Allergen Reactions  .  Sulfamethoxazole-Trimethoprim     Very sore, sore in corner of lip     Review of Systems: All systems reviewed and negative except where noted in HPI.   Lab Results  Component Value Date   WBC 5.2 05/02/2015   HGB 13.4 05/02/2015   HCT 40.6 05/02/2015   MCV 89.3 05/02/2015   PLT 243.0 05/02/2015    Lab Results  Component Value Date   CREATININE 0.79 05/02/2015   BUN 15 05/02/2015   NA 134* 05/02/2015   K 4.1 05/02/2015   CL 99 05/02/2015   CO2 31 05/02/2015    Lab Results  Component Value Date   ALT 20 05/02/2015   AST 26 05/02/2015   ALKPHOS 56 05/02/2015   BILITOT 0.4 05/02/2015     Physical Exam: BP 120/70 mmHg  Pulse 72  Ht 4' 11.5" (1.511 m)  Wt 136 lb 4 oz (61.803 kg)  BMI 27.07 kg/m2 Constitutional: Pleasant,well-developed,  female in no acute distress. HEENT: Normocephalic and atraumatic. Conjunctivae are normal. No scleral icterus. Neck supple.  Cardiovascular: Normal rate, regular rhythm.  Pulmonary/chest: Effort normal and breath sounds normal. No wheezing, rales or rhonchi. Abdominal: Soft, nondistended, nontender. Bowel sounds active throughout. There are no masses palpable. No hepatomegaly. Extremities: no edema Lymphadenopathy: No cervical adenopathy noted. Neurological: Alert and oriented to person place and time. Skin: Skin is warm and dry. No rashes noted. Psychiatric: Normal mood and affect. Behavior is normal.   ASSESSMENT AND PLAN: 75 y/o female with PMH as outlined above with a family history of colon cancer (father age 66s), here to be assessed for a possible screening colonoscopy.   She has no alarm symptoms, no anemia. Last colonoscopy in 2011 without adenomas. I discussed current ACG CRC screening guidelines with them. Given her father was older than age 69 at the time of his diagnosis and her last colonoscopy was normal without adenomatous polyps, guidelines would recommend a repeat screening in 10 years from her last exam. If she has new symptoms or a clinical change with concerning symptoms we can do it sooner. Alternatively, given her family history and that she seemed anxious about waiting another 5 years for a colonoscopy, I otherwise offered her an exam for piece of mind and to provide reassurance. Following our discussion she wished to hold off on colonoscopy at this time based on guidelines I referenced. We will put in a recall for a repeat screening exam in 2021. If she continues to be in good health at that age, will repeat screening at that time. She agreed and all questions answered.   Huxley Cellar, MD [facility] Pager [phone]  CC: Mosie Lukes, MD

## 2015-08-01 NOTE — Patient Instructions (Signed)
Thank you for choosing [facility] to care for you.  

## 2015-08-09 DIAGNOSIS — M9904 Segmental and somatic dysfunction of sacral region: Secondary | ICD-10-CM | POA: Diagnosis not present

## 2015-08-09 DIAGNOSIS — M9901 Segmental and somatic dysfunction of cervical region: Secondary | ICD-10-CM | POA: Diagnosis not present

## 2015-08-09 DIAGNOSIS — M545 Low back pain: Secondary | ICD-10-CM | POA: Diagnosis not present

## 2015-08-09 DIAGNOSIS — M9903 Segmental and somatic dysfunction of lumbar region: Secondary | ICD-10-CM | POA: Diagnosis not present

## 2015-09-06 DIAGNOSIS — M9901 Segmental and somatic dysfunction of cervical region: Secondary | ICD-10-CM | POA: Diagnosis not present

## 2015-09-06 DIAGNOSIS — M545 Low back pain: Secondary | ICD-10-CM | POA: Diagnosis not present

## 2015-09-06 DIAGNOSIS — M9904 Segmental and somatic dysfunction of sacral region: Secondary | ICD-10-CM | POA: Diagnosis not present

## 2015-09-06 DIAGNOSIS — M9903 Segmental and somatic dysfunction of lumbar region: Secondary | ICD-10-CM | POA: Diagnosis not present

## 2015-09-28 ENCOUNTER — Other Ambulatory Visit: Payer: Self-pay | Admitting: Family Medicine

## 2015-09-28 MED ORDER — SIMVASTATIN 20 MG PO TABS: ORAL_TABLET | ORAL | Status: DC

## 2015-10-02 DIAGNOSIS — D1801 Hemangioma of skin and subcutaneous tissue: Secondary | ICD-10-CM | POA: Diagnosis not present

## 2015-10-02 DIAGNOSIS — L821 Other seborrheic keratosis: Secondary | ICD-10-CM | POA: Diagnosis not present

## 2015-10-06 DIAGNOSIS — M9901 Segmental and somatic dysfunction of cervical region: Secondary | ICD-10-CM | POA: Diagnosis not present

## 2015-10-06 DIAGNOSIS — M545 Low back pain: Secondary | ICD-10-CM | POA: Diagnosis not present

## 2015-10-06 DIAGNOSIS — M9903 Segmental and somatic dysfunction of lumbar region: Secondary | ICD-10-CM | POA: Diagnosis not present

## 2015-10-06 DIAGNOSIS — M9904 Segmental and somatic dysfunction of sacral region: Secondary | ICD-10-CM | POA: Diagnosis not present

## 2015-10-31 ENCOUNTER — Other Ambulatory Visit (INDEPENDENT_AMBULATORY_CARE_PROVIDER_SITE_OTHER): Payer: Medicare Other

## 2015-10-31 DIAGNOSIS — E785 Hyperlipidemia, unspecified: Secondary | ICD-10-CM

## 2015-10-31 DIAGNOSIS — K219 Gastro-esophageal reflux disease without esophagitis: Secondary | ICD-10-CM

## 2015-10-31 DIAGNOSIS — M810 Age-related osteoporosis without current pathological fracture: Secondary | ICD-10-CM

## 2015-10-31 DIAGNOSIS — I10 Essential (primary) hypertension: Secondary | ICD-10-CM | POA: Diagnosis not present

## 2015-10-31 DIAGNOSIS — N952 Postmenopausal atrophic vaginitis: Secondary | ICD-10-CM | POA: Diagnosis not present

## 2015-10-31 DIAGNOSIS — H8112 Benign paroxysmal vertigo, left ear: Secondary | ICD-10-CM | POA: Diagnosis not present

## 2015-10-31 LAB — TSH: TSH: 4.13 u[IU]/mL (ref 0.35–4.50)

## 2015-10-31 LAB — COMPREHENSIVE METABOLIC PANEL
ALBUMIN: 4.2 g/dL (ref 3.5–5.2)
ALK PHOS: 51 U/L (ref 39–117)
ALT: 17 U/L (ref 0–35)
AST: 21 U/L (ref 0–37)
BUN: 13 mg/dL (ref 6–23)
CO2: 32 mEq/L (ref 19–32)
CREATININE: 0.79 mg/dL (ref 0.40–1.20)
Calcium: 9.3 mg/dL (ref 8.4–10.5)
Chloride: 100 mEq/L (ref 96–112)
GFR: 75.55 mL/min (ref >60.00)
Glucose, Bld: 101 mg/dL — ABNORMAL HIGH (ref 70–99)
POTASSIUM: 4.5 meq/L (ref 3.5–5.1)
SODIUM: 136 meq/L (ref 135–145)
TOTAL PROTEIN: 7.2 g/dL (ref 6.0–8.3)
Total Bilirubin: 0.4 mg/dL (ref 0.2–1.2)

## 2015-10-31 LAB — CBC
HEMATOCRIT: 39.8 % (ref 36.0–46.0)
HEMOGLOBIN: 13.2 g/dL (ref 12.0–15.0)
MCHC: 33.1 g/dL (ref 30.0–36.0)
MCV: 88.3 fl (ref 78.0–100.0)
PLATELETS: 223 10*3/uL (ref 150.0–400.0)
RBC: 4.51 Mil/uL (ref 3.87–5.11)
RDW: 14.3 % (ref 11.5–15.5)
WBC: 5.2 10*3/uL (ref 4.0–10.5)

## 2015-10-31 LAB — LIPID PANEL
CHOLESTEROL: 178 mg/dL (ref 0–200)
HDL: 69.1 mg/dL (ref >39.00)
LDL Cholesterol: 88 mg/dL (ref 0–99)
NonHDL: 108.51
TRIGLYCERIDES: 101 mg/dL (ref 0.0–149.0)
Total CHOL/HDL Ratio: 3
VLDL: 20.2 mg/dL (ref 0.0–40.0)

## 2015-10-31 LAB — VITAMIN D 25 HYDROXY (VIT D DEFICIENCY, FRACTURES): VITD: 29.71 ng/mL — ABNORMAL LOW (ref 30.00–100.00)

## 2015-11-03 DIAGNOSIS — M545 Low back pain: Secondary | ICD-10-CM | POA: Diagnosis not present

## 2015-11-03 DIAGNOSIS — M9901 Segmental and somatic dysfunction of cervical region: Secondary | ICD-10-CM | POA: Diagnosis not present

## 2015-11-03 DIAGNOSIS — M9904 Segmental and somatic dysfunction of sacral region: Secondary | ICD-10-CM | POA: Diagnosis not present

## 2015-11-03 DIAGNOSIS — M9903 Segmental and somatic dysfunction of lumbar region: Secondary | ICD-10-CM | POA: Diagnosis not present

## 2015-11-07 ENCOUNTER — Ambulatory Visit (INDEPENDENT_AMBULATORY_CARE_PROVIDER_SITE_OTHER): Payer: Medicare Other | Admitting: Family Medicine

## 2015-11-07 ENCOUNTER — Encounter: Payer: Self-pay | Admitting: Family Medicine

## 2015-11-07 VITALS — BP 120/62 | HR 78 | Temp 97.8°F | Ht 60.0 in | Wt 136.1 lb

## 2015-11-07 DIAGNOSIS — M810 Age-related osteoporosis without current pathological fracture: Secondary | ICD-10-CM

## 2015-11-07 DIAGNOSIS — E785 Hyperlipidemia, unspecified: Secondary | ICD-10-CM | POA: Diagnosis not present

## 2015-11-07 DIAGNOSIS — Z0000 Encounter for general adult medical examination without abnormal findings: Secondary | ICD-10-CM

## 2015-11-07 DIAGNOSIS — E559 Vitamin D deficiency, unspecified: Secondary | ICD-10-CM | POA: Insufficient documentation

## 2015-11-07 DIAGNOSIS — B079 Viral wart, unspecified: Secondary | ICD-10-CM

## 2015-11-07 DIAGNOSIS — B078 Other viral warts: Secondary | ICD-10-CM | POA: Insufficient documentation

## 2015-11-07 DIAGNOSIS — H6123 Impacted cerumen, bilateral: Secondary | ICD-10-CM

## 2015-11-07 DIAGNOSIS — I10 Essential (primary) hypertension: Secondary | ICD-10-CM | POA: Diagnosis not present

## 2015-11-07 DIAGNOSIS — Z1231 Encounter for screening mammogram for malignant neoplasm of breast: Secondary | ICD-10-CM | POA: Insufficient documentation

## 2015-11-07 DIAGNOSIS — K635 Polyp of colon: Secondary | ICD-10-CM

## 2015-11-07 HISTORY — DX: Vitamin D deficiency, unspecified: E55.9

## 2015-11-07 HISTORY — DX: Viral wart, unspecified: B07.9

## 2015-11-07 HISTORY — DX: Encounter for general adult medical examination without abnormal findings: Z00.00

## 2015-11-07 MED ORDER — RAMIPRIL 10 MG PO CAPS: 10.0000 mg | ORAL_CAPSULE | Freq: Every day | ORAL | Status: DC

## 2015-11-07 NOTE — Assessment & Plan Note (Signed)
Clear today and feeling well

## 2015-11-07 NOTE — Assessment & Plan Note (Signed)
Left 3rd finger, follows with dermatology, recent biopsy was negative for CA but lesion is returning

## 2015-11-07 NOTE — Progress Notes (Signed)
Subjective:    Patient ID: Terri Sandoval, female    DOB: 1941/05/13, 75 y.o.   MRN: [id number]  Chief Complaint  Patient presents with  . Annual Exam    HPI Patient is in today for Annual Exam. She underwent a Medicare Annual Wellness Visit in October and is here today for an annual exam and follow up on numerous concerns. She feels well and is staying active. No acute complaints. Her 65 yo son with special needs is well settled at his group home and doing well. As a result she has been better able to care for herself. Is managing her ADLs well and has not had any recent illness or hospitalizations.Denies CP/palp/SOB/HA/congestion/fevers/GI or GU c/o. Taking meds as prescribed  Past Medical History  Diagnosis Date  . GERD (gastroesophageal reflux disease)   . Hypertension   . Anxiety   . Hyperlipidemia   . Arthritis   . Chicken pox as a child  . Diverticulitis   . Colon polyps     removed during colonscopy  . Urine incontinence     sometimes  . Diverticulosis   . Osteoporosis, unspecified 03/09/2013  . Adrenal insufficiency ([place])   . Medicare annual wellness visit, subsequent 05/16/2013  . Atrophic vaginitis 11/08/2013  . BPV (benign positional vertigo) 05/08/2015  . Skin cancer     behind ear  . Vitamin D deficiency 11/07/2015  . Preventative health care 11/07/2015  . Verruca 11/07/2015    Past Surgical History  Procedure Laterality Date  . Abdominal hysterectomy  97    has ovaries  . Tonsillectomy and adenoidectomy  1990  . Dilation and curettage of uterus  1990, 1993, 1997  . Breast biopsy Left      FNA  . Cataract extraction  L 09/11/10, R 10/10/10    Family History  Problem Relation Age of Onset  . Arthritis Mother   . Hyperlipidemia Mother   . Heart disease Mother   . Hypertension Mother   . Colon cancer Father   . Diabetes Sister     type 2  . Heart disease Sister   . Hypertension Sister   . Hyperlipidemia Sister   . Mental illness Maternal Uncle   .  Stroke Maternal Grandmother   . Pneumonia Maternal Grandfather   . Cerebral palsy Son     MR  . Colon cancer Cousin     Social History   Social History  . Marital Status: Married    Spouse Name: N/A  . Number of Children: 2  . Years of Education: N/A   Occupational History  . retired    Social History Main Topics  . Smoking status: Never Smoker   . Smokeless tobacco: Never Used  . Alcohol Use: No  . Drug Use: No  . Sexual Activity: Not on file     Comment: lives with husband and disabled son, no dietary restrictions, not exercising regularly   Other Topics Concern  . Not on file   Social History Narrative    Outpatient Prescriptions Prior to Visit  Medication Sig Dispense Refill  . conjugated estrogens (PREMARIN) vaginal cream Place vaginally daily. X 1 week then drop to twice weekly 42.5 g 2  . fish oil-omega-3 fatty acids 1000 MG capsule Take 1,200 mg by mouth daily.    Marland Kitchen FLUoxetine (PROZAC) 20 MG capsule TAKE 1 CAPSULE BY MOUTH EVERY DAY 90 capsule 1  . glucosamine-chondroitin 500-400 MG tablet Take 1 tablet by mouth 2 (two) times daily.     Marland Kitchen  Multiple Vitamin (MULTIVITAMIN) tablet Take 1 tablet by mouth daily.    Marland Kitchen neomycin-polymyxin-hydrocortisone (CORTISPORIN) otic solution Place 3 drops into both ears at bedtime. For 5 days prior to having flushing 10 mL 1  . simvastatin (ZOCOR) 20 MG tablet TAKE 1 TABLET BY MOUTH DAILY AT 6 PM 90 tablet 1  . ramipril (ALTACE) 10 MG capsule Take 1 capsule (10 mg total) by mouth daily. 90 capsule 1   No facility-administered medications prior to visit.    Allergies  Allergen Reactions  . Sulfamethoxazole-Trimethoprim     Very sore, sore in corner of lip    Review of Systems  Constitutional: Negative for fever and malaise/fatigue.  HENT: Negative for congestion.   Eyes: Negative for blurred vision.  Respiratory: Negative for shortness of breath.   Cardiovascular: Negative for chest pain, palpitations and leg swelling.    Gastrointestinal: Negative for nausea, abdominal pain and blood in stool.  Genitourinary: Negative for dysuria and frequency.  Musculoskeletal: Negative for falls.  Skin: Negative for rash.  Neurological: Negative for dizziness, loss of consciousness and headaches.  Endo/Heme/Allergies: Negative for environmental allergies.  Psychiatric/Behavioral: Negative for depression. The patient is not nervous/anxious.        Objective:    Physical Exam  Constitutional: She is oriented to person, place, and time. She appears well-developed and well-nourished. No distress.  HENT:  Head: Normocephalic and atraumatic.  Eyes: Conjunctivae are normal.  Neck: Neck supple. No thyromegaly present.  Cardiovascular: Normal rate, regular rhythm and normal heart sounds.   No murmur heard. Pulmonary/Chest: Effort normal and breath sounds normal. No respiratory distress.  Abdominal: Soft. Bowel sounds are normal. She exhibits no distension and no mass. There is no tenderness.  Musculoskeletal: She exhibits no edema.  Lymphadenopathy:    She has no cervical adenopathy.  Neurological: She is alert and oriented to person, place, and time.  Skin: Skin is warm and dry.  Small verrucous lesion at base of fingernail on left 3rd finger.   Psychiatric: She has a normal mood and affect. Her behavior is normal.    BP 120/62 mmHg  Pulse 78  Temp(Src) 97.8 F (36.6 C) (Oral)  Ht 5' (1.524 m)  Wt 136 lb 2 oz (61.746 kg)  BMI 26.59 kg/m2  SpO2 95% Wt Readings from Last 3 Encounters:  11/07/15 136 lb 2 oz (61.746 kg)  08/01/15 136 lb 4 oz (61.803 kg)  05/08/15 134 lb (60.782 kg)     Lab Results  Component Value Date   WBC 5.2 10/31/2015   HGB 13.2 10/31/2015   HCT 39.8 10/31/2015   PLT 223.0 10/31/2015   GLUCOSE 101* 10/31/2015   CHOL 178 10/31/2015   TRIG 101.0 10/31/2015   HDL 69.10 10/31/2015   LDLCALC 88 10/31/2015   ALT 17 10/31/2015   AST 21 10/31/2015   NA 136 10/31/2015   K 4.5  10/31/2015   CL 100 10/31/2015   CREATININE 0.79 10/31/2015   BUN 13 10/31/2015   CO2 32 10/31/2015   TSH 4.13 10/31/2015    Lab Results  Component Value Date   TSH 4.13 10/31/2015   Lab Results  Component Value Date   WBC 5.2 10/31/2015   HGB 13.2 10/31/2015   HCT 39.8 10/31/2015   MCV 88.3 10/31/2015   PLT 223.0 10/31/2015   Lab Results  Component Value Date   NA 136 10/31/2015   K 4.5 10/31/2015   CO2 32 10/31/2015   GLUCOSE 101* 10/31/2015   BUN 13 10/31/2015  CREATININE 0.79 10/31/2015   BILITOT 0.4 10/31/2015   ALKPHOS 51 10/31/2015   AST 21 10/31/2015   ALT 17 10/31/2015   PROT 7.2 10/31/2015   ALBUMIN 4.2 10/31/2015   CALCIUM 9.3 10/31/2015   GFR 75.55 10/31/2015   Lab Results  Component Value Date   CHOL 178 10/31/2015   Lab Results  Component Value Date   HDL 69.10 10/31/2015   Lab Results  Component Value Date   LDLCALC 88 10/31/2015   Lab Results  Component Value Date   TRIG 101.0 10/31/2015   Lab Results  Component Value Date   CHOLHDL 3 10/31/2015   No results found for: HGBA1C     Assessment & Plan:   Problem List Items Addressed This Visit    Cerumen impaction    Clear today and feeling well      Colon polyps    Has established with [facility] now and has elected to wait for repeat colonoscopy til 2021 unless concerning symptoms develop      Hyperlipidemia    Tolerating statin, encouraged heart healthy diet, avoid trans fats, minimize simple carbs and saturated fats. Increase exercise as tolerated      Relevant Medications   ramipril (ALTACE) 10 MG capsule   Other Relevant Orders   CBC   TSH   Lipid panel   Comprehensive metabolic panel   VITAMIN D 25 Hydroxy (Vit-D Deficiency, Fractures)   Hypertension - Primary    Well controlled, no changes to meds. Encouraged heart healthy diet such as the DASH diet and exercise as tolerated.       Relevant Medications   ramipril (ALTACE) 10 MG capsule   Other Relevant Orders    CBC   TSH   Lipid panel   Comprehensive metabolic panel   VITAMIN D 25 Hydroxy (Vit-D Deficiency, Fractures)   Osteoporosis    Encouraged to get adequate exercise, calcium and vitamin d intake      Relevant Orders   CBC   TSH   Lipid panel   Comprehensive metabolic panel   VITAMIN D 25 Hydroxy (Vit-D Deficiency, Fractures)   Preventative health care    Patient encouraged to maintain heart healthy diet, regular exercise, adequate sleep. Consider daily probiotics. Take medications as prescribed. Has advanced directives from [place] but is in the process of getting documents put together for [place], will get [place] a copy when complete      Verruca    Left 3rd finger, follows with dermatology, recent biopsy was negative for CA but lesion is returning      Vitamin D deficiency    Mildly low encouraged to add a 1000 IU supplement daily and reassess at next visit      Relevant Orders   CBC   TSH   Lipid panel   Comprehensive metabolic panel   VITAMIN D 25 Hydroxy (Vit-D Deficiency, Fractures)      I am having Ms. Googe maintain her glucosamine-chondroitin, fish oil-omega-3 fatty acids, multivitamin, neomycin-polymyxin-hydrocortisone, FLUoxetine, conjugated estrogens, simvastatin, [facility], and ramipril.  Meds ordered this encounter  Medications  . Probiotic Product ([place]) CAPS    Sig: Take 1 capsule by mouth daily.  . ramipril (ALTACE) 10 MG capsule    Sig: Take 1 capsule (10 mg total) by mouth daily.    Dispense:  90 capsule    Refill:  1    D/C PREVIOUS SCRIPTS FOR THIS MEDICATION     Penni Homans, MD

## 2015-11-07 NOTE — Assessment & Plan Note (Signed)
Tolerating statin, encouraged heart healthy diet, avoid trans fats, minimize simple carbs and saturated fats. Increase exercise as tolerated 

## 2015-11-07 NOTE — Progress Notes (Signed)
Pre visit review using our clinic review tool, if applicable. No additional management support is needed unless otherwise documented below in the visit note. 

## 2015-11-07 NOTE — Patient Instructions (Addendum)
Take a Zantac as needed. Folic Acid can 1 mg OTC. Try a daily Vitamin D supplement.   Preventive Care for Adults, Female A healthy lifestyle and preventive care can promote health and wellness. Preventive health guidelines for women include the following key practices.  A routine yearly physical is a good way to check with your health care provider about your health and preventive screening. It is a chance to share any concerns and updates on your health and to receive a thorough exam.  Visit your dentist for a routine exam and preventive care every 6 months. Brush your teeth twice a day and floss once a day. Good oral hygiene prevents tooth decay and gum disease.  The frequency of eye exams is based on your age, health, family medical history, use of contact lenses, and other factors. Follow your health care provider's recommendations for frequency of eye exams.  Eat a healthy diet. Foods like vegetables, fruits, whole grains, low-fat dairy products, and lean protein foods contain the nutrients you need without too many calories. Decrease your intake of foods high in solid fats, added sugars, and salt. Eat the right amount of calories for you.Get information about a proper diet from your health care provider, if necessary.  Regular physical exercise is one of the most important things you can do for your health. Most adults should get at least 150 minutes of moderate-intensity exercise (any activity that increases your heart rate and causes you to sweat) each week. In addition, most adults need muscle-strengthening exercises on 2 or more days a week.  Maintain a healthy weight. The body mass index (BMI) is a screening tool to identify possible weight problems. It provides an estimate of body fat based on height and weight. Your health care provider can find your BMI and can help you achieve or maintain a healthy weight.For adults 20 years and older:  A BMI below 18.5 is considered  underweight.  A BMI of 18.5 to 24.9 is normal.  A BMI of 25 to 29.9 is considered overweight.  A BMI of 30 and above is considered obese.  Maintain normal blood lipids and cholesterol levels by exercising and minimizing your intake of saturated fat. Eat a balanced diet with plenty of fruit and vegetables. Blood tests for lipids and cholesterol should begin at age 67 and be repeated every 5 years. If your lipid or cholesterol levels are high, you are over 50, or you are at high risk for heart disease, you may need your cholesterol levels checked more frequently.Ongoing high lipid and cholesterol levels should be treated with medicines if diet and exercise are not working.  If you smoke, find out from your health care provider how to quit. If you do not use tobacco, do not start.  Lung cancer screening is recommended for adults aged 55-80 years who are at high risk for developing lung cancer because of a history of smoking. A yearly low-dose CT scan of the lungs is recommended for people who have at least a 30-pack-year history of smoking and are a current smoker or have quit within the past 15 years. A pack year of smoking is smoking an average of 1 pack of cigarettes a day for 1 year (for example: 1 pack a day for 30 years or 2 packs a day for 15 years). Yearly screening should continue until the smoker has stopped smoking for at least 15 years. Yearly screening should be stopped for people who develop a health problem that would  prevent them from having lung cancer treatment.  If you are pregnant, do not drink alcohol. If you are breastfeeding, be very cautious about drinking alcohol. If you are not pregnant and choose to drink alcohol, do not have more than 1 drink per day. One drink is considered to be 12 ounces (355 mL) of beer, 5 ounces (148 mL) of wine, or 1.5 ounces (44 mL) of liquor.  Avoid use of street drugs. Do not share needles with anyone. Ask for help if you need support or  instructions about stopping the use of drugs.  High blood pressure causes heart disease and increases the risk of stroke. Your blood pressure should be checked at least every 1 to 2 years. Ongoing high blood pressure should be treated with medicines if weight loss and exercise do not work.  If you are 17-30 years old, ask your health care provider if you should take aspirin to prevent strokes.  Diabetes screening is done by taking a blood sample to check your blood glucose level after you have not eaten for a certain period of time (fasting). If you are not overweight and you do not have risk factors for diabetes, you should be screened once every 3 years starting at age 83. If you are overweight or obese and you are 28-71 years of age, you should be screened for diabetes every year as part of your cardiovascular risk assessment.  Breast cancer screening is essential preventive care for women. You should practice "breast self-awareness." This means understanding the normal appearance and feel of your breasts and may include breast self-examination. Any changes detected, no matter how small, should be reported to a health care provider. Women in their 10s and 30s should have a clinical breast exam (CBE) by a health care provider as part of a regular health exam every 1 to 3 years. After age 23, women should have a CBE every year. Starting at age 103, women should consider having a mammogram (breast X-ray test) every year. Women who have a family history of breast cancer should talk to their health care provider about genetic screening. Women at a high risk of breast cancer should talk to their health care providers about having an MRI and a mammogram every year.  Breast cancer gene (BRCA)-related cancer risk assessment is recommended for women who have family members with BRCA-related cancers. BRCA-related cancers include breast, ovarian, tubal, and peritoneal cancers. Having family members with these  cancers may be associated with an increased risk for harmful changes (mutations) in the breast cancer genes BRCA1 and BRCA2. Results of the assessment will determine the need for genetic counseling and BRCA1 and BRCA2 testing.  Your health care provider may recommend that you be screened regularly for cancer of the pelvic organs (ovaries, uterus, and vagina). This screening involves a pelvic examination, including checking for microscopic changes to the surface of your cervix (Pap test). You may be encouraged to have this screening done every 3 years, beginning at age 28.  For women ages 41-65, health care providers may recommend pelvic exams and Pap testing every 3 years, or they may recommend the Pap and pelvic exam, combined with testing for human papilloma virus (HPV), every 5 years. Some types of HPV increase your risk of cervical cancer. Testing for HPV may also be done on women of any age with unclear Pap test results.  Other health care providers may not recommend any screening for nonpregnant women who are considered low risk for pelvic cancer  and who do not have symptoms. Ask your health care provider if a screening pelvic exam is right for you.  If you have had past treatment for cervical cancer or a condition that could lead to cancer, you need Pap tests and screening for cancer for at least 20 years after your treatment. If Pap tests have been discontinued, your risk factors (such as having a new sexual partner) need to be reassessed to determine if screening should resume. Some women have medical problems that increase the chance of getting cervical cancer. In these cases, your health care provider may recommend more frequent screening and Pap tests.  Colorectal cancer can be detected and often prevented. Most routine colorectal cancer screening begins at the age of 38 years and continues through age 79 years. However, your health care provider may recommend screening at an earlier age if you  have risk factors for colon cancer. On a yearly basis, your health care provider may provide home test kits to check for hidden blood in the stool. Use of a small camera at the end of a tube, to directly examine the colon (sigmoidoscopy or colonoscopy), can detect the earliest forms of colorectal cancer. Talk to your health care provider about this at age 41, when routine screening begins. Direct exam of the colon should be repeated every 5-10 years through age 45 years, unless early forms of precancerous polyps or small growths are found.  People who are at an increased risk for hepatitis B should be screened for this virus. You are considered at high risk for hepatitis B if:  You were born in a country where hepatitis B occurs often. Talk with your health care provider about which countries are considered high risk.  Your parents were born in a high-risk country and you have not received a shot to protect against hepatitis B (hepatitis B vaccine).  You have HIV or AIDS.  You use needles to inject street drugs.  You live with, or have sex with, someone who has hepatitis B.  You get hemodialysis treatment.  You take certain medicines for conditions like cancer, organ transplantation, and autoimmune conditions.  Hepatitis C blood testing is recommended for all people born from [birth year] through 1965 and any individual with known risks for hepatitis C.  Practice safe sex. Use condoms and avoid high-risk sexual practices to reduce the spread of sexually transmitted infections (STIs). STIs include gonorrhea, chlamydia, syphilis, trichomonas, herpes, HPV, and human immunodeficiency virus (HIV). Herpes, HIV, and HPV are viral illnesses that have no cure. They can result in disability, cancer, and death.  You should be screened for sexually transmitted illnesses (STIs) including gonorrhea and chlamydia if:  You are sexually active and are younger than 24 years.  You are older than 24 years and your  health care provider tells you that you are at risk for this type of infection.  Your sexual activity has changed since you were last screened and you are at an increased risk for chlamydia or gonorrhea. Ask your health care provider if you are at risk.  If you are at risk of being infected with HIV, it is recommended that you take a prescription medicine daily to prevent HIV infection. This is called preexposure prophylaxis (PrEP). You are considered at risk if:  You are sexually active and do not regularly use condoms or know the HIV status of your partner(s).  You take drugs by injection.  You are sexually active with a partner who has HIV.  Talk with your health care provider about whether you are at high risk of being infected with HIV. If you choose to begin PrEP, you should first be tested for HIV. You should then be tested every 3 months for as long as you are taking PrEP.  Osteoporosis is a disease in which the bones lose minerals and strength with aging. This can result in serious bone fractures or breaks. The risk of osteoporosis can be identified using a bone density scan. Women ages 86 years and over and women at risk for fractures or osteoporosis should discuss screening with their health care providers. Ask your health care provider whether you should take a calcium supplement or vitamin D to reduce the rate of osteoporosis.  Menopause can be associated with physical symptoms and risks. Hormone replacement therapy is available to decrease symptoms and risks. You should talk to your health care provider about whether hormone replacement therapy is right for you.  Use sunscreen. Apply sunscreen liberally and repeatedly throughout the day. You should seek shade when your shadow is shorter than you. Protect yourself by wearing long sleeves, pants, a wide-brimmed hat, and sunglasses year round, whenever you are outdoors.  Once a month, do a whole body skin exam, using a mirror to look  at the skin on your back. Tell your health care provider of new moles, moles that have irregular borders, moles that are larger than a pencil eraser, or moles that have changed in shape or color.  Stay current with required vaccines (immunizations).  Influenza vaccine. All adults should be immunized every year.  Tetanus, diphtheria, and acellular pertussis (Td, Tdap) vaccine. Pregnant women should receive 1 dose of Tdap vaccine during each pregnancy. The dose should be obtained regardless of the length of time since the last dose. Immunization is preferred during the 27th-36th week of gestation. An adult who has not previously received Tdap or who does not know her vaccine status should receive 1 dose of Tdap. This initial dose should be followed by tetanus and diphtheria toxoids (Td) booster doses every 10 years. Adults with an unknown or incomplete history of completing a 3-dose immunization series with Td-containing vaccines should begin or complete a primary immunization series including a Tdap dose. Adults should receive a Td booster every 10 years.  Varicella vaccine. An adult without evidence of immunity to varicella should receive 2 doses or a second dose if she has previously received 1 dose. Pregnant females who do not have evidence of immunity should receive the first dose after pregnancy. This first dose should be obtained before leaving the health care facility. The second dose should be obtained 4-8 weeks after the first dose.  Human papillomavirus (HPV) vaccine. Females aged 13-26 years who have not received the vaccine previously should obtain the 3-dose series. The vaccine is not recommended for use in pregnant females. However, pregnancy testing is not needed before receiving a dose. If a female is found to be pregnant after receiving a dose, no treatment is needed. In that case, the remaining doses should be delayed until after the pregnancy. Immunization is recommended for any person  with an immunocompromised condition through the age of 37 years if she did not get any or all doses earlier. During the 3-dose series, the second dose should be obtained 4-8 weeks after the first dose. The third dose should be obtained 24 weeks after the first dose and 16 weeks after the second dose.  Zoster vaccine. One dose is recommended for adults  aged 61 years or older unless certain conditions are present.  Measles, mumps, and rubella (MMR) vaccine. Adults born before [birth year] generally are considered immune to measles and mumps. Adults born in [birth year] or later should have 1 or more doses of MMR vaccine unless there is a contraindication to the vaccine or there is laboratory evidence of immunity to each of the three diseases. A routine second dose of MMR vaccine should be obtained at least 28 days after the first dose for students attending postsecondary schools, health care workers, or international travelers. People who received inactivated measles vaccine or an unknown type of measles vaccine during 1963-1967 should receive 2 doses of MMR vaccine. People who received inactivated mumps vaccine or an unknown type of mumps vaccine before 1979 and are at high risk for mumps infection should consider immunization with 2 doses of MMR vaccine. For females of childbearing age, rubella immunity should be determined. If there is no evidence of immunity, females who are not pregnant should be vaccinated. If there is no evidence of immunity, females who are pregnant should delay immunization until after pregnancy. Unvaccinated health care workers born before [birth year] who lack laboratory evidence of measles, mumps, or rubella immunity or laboratory confirmation of disease should consider measles and mumps immunization with 2 doses of MMR vaccine or rubella immunization with 1 dose of MMR vaccine.  Pneumococcal 13-valent conjugate (PCV13) vaccine. When indicated, a person who is uncertain of his immunization history and has  no record of immunization should receive the PCV13 vaccine. All adults 78 years of age and older should receive this vaccine. An adult aged 103 years or older who has certain medical conditions and has not been previously immunized should receive 1 dose of PCV13 vaccine. This PCV13 should be followed with a dose of pneumococcal polysaccharide (PPSV23) vaccine. Adults who are at high risk for pneumococcal disease should obtain the PPSV23 vaccine at least 8 weeks after the dose of PCV13 vaccine. Adults older than 75 years of age who have normal immune system function should obtain the PPSV23 vaccine dose at least 1 year after the dose of PCV13 vaccine.  Pneumococcal polysaccharide (PPSV23) vaccine. When PCV13 is also indicated, PCV13 should be obtained first. All adults aged 62 years and older should be immunized. An adult younger than age 78 years who has certain medical conditions should be immunized. Any person who resides in a nursing home or long-term care facility should be immunized. An adult smoker should be immunized. People with an immunocompromised condition and certain other conditions should receive both PCV13 and PPSV23 vaccines. People with human immunodeficiency virus (HIV) infection should be immunized as soon as possible after diagnosis. Immunization during chemotherapy or radiation therapy should be avoided. Routine use of PPSV23 vaccine is not recommended for American Indians, [place] Natives, or people younger than 65 years unless there are medical conditions that require PPSV23 vaccine. When indicated, people who have unknown immunization and have no record of immunization should receive PPSV23 vaccine. One-time revaccination 5 years after the first dose of PPSV23 is recommended for people aged 19-64 years who have chronic kidney failure, nephrotic syndrome, asplenia, or immunocompromised conditions. People who received 1-2 doses of PPSV23 before age 39 years should receive another dose of PPSV23  vaccine at age 86 years or later if at least 5 years have passed since the previous dose. Doses of PPSV23 are not needed for people immunized with PPSV23 at or after age 96 years.  Meningococcal vaccine. Adults with asplenia or  persistent complement component deficiencies should receive 2 doses of quadrivalent meningococcal conjugate (MenACWY-D) vaccine. The doses should be obtained at least 2 months apart. Microbiologists working with certain meningococcal bacteria, [place] recruits, people at risk during an outbreak, and people who travel to or live in countries with a high rate of meningitis should be immunized. A first-year college student up through age 34 years who is living in a residence [place] should receive a dose if she did not receive a dose on or after her 16th birthday. Adults who have certain high-risk conditions should receive one or more doses of vaccine.  Hepatitis A vaccine. Adults who wish to be protected from this disease, have certain high-risk conditions, work with hepatitis A-infected animals, work in hepatitis A research labs, or travel to or work in countries with a high rate of hepatitis A should be immunized. Adults who were previously unvaccinated and who anticipate close contact with an international adoptee during the first 60 days after arrival in the [place] from a country with a high rate of hepatitis A should be immunized.  Hepatitis B vaccine. Adults who wish to be protected from this disease, have certain high-risk conditions, may be exposed to blood or other infectious body fluids, are household contacts or sex partners of hepatitis B positive people, are clients or workers in certain care facilities, or travel to or work in countries with a high rate of hepatitis B should be immunized.  Haemophilus influenzae type b (Hib) vaccine. A previously unvaccinated person with asplenia or sickle cell disease or having a scheduled splenectomy should receive 1 dose of Hib  vaccine. Regardless of previous immunization, a recipient of a hematopoietic stem cell transplant should receive a 3-dose series 6-12 months after her successful transplant. Hib vaccine is not recommended for adults with HIV infection. Preventive Services / Frequency Ages 35 to 23 years  Blood pressure check.** / Every 3-5 years.  Lipid and cholesterol check.** / Every 5 years beginning at age 65.  Clinical breast exam.** / Every 3 years for women in their 84s and 89s.  BRCA-related cancer risk assessment.** / For women who have family members with a BRCA-related cancer (breast, ovarian, tubal, or peritoneal cancers).  Pap test.** / Every 2 years from ages 40 through 53. Every 3 years starting at age 48 through age 94 or 96 with a history of 3 consecutive normal Pap tests.  HPV screening.** / Every 3 years from ages 73 through ages 7 to 25 with a history of 3 consecutive normal Pap tests.  Hepatitis C blood test.** / For any individual with known risks for hepatitis C.  Skin self-exam. / Monthly.  Influenza vaccine. / Every year.  Tetanus, diphtheria, and acellular pertussis (Tdap, Td) vaccine.** / Consult your health care provider. Pregnant women should receive 1 dose of Tdap vaccine during each pregnancy. 1 dose of Td every 10 years.  Varicella vaccine.** / Consult your health care provider. Pregnant females who do not have evidence of immunity should receive the first dose after pregnancy.  HPV vaccine. / 3 doses over 6 months, if 91 and younger. The vaccine is not recommended for use in pregnant females. However, pregnancy testing is not needed before receiving a dose.  Measles, mumps, rubella (MMR) vaccine.** / You need at least 1 dose of MMR if you were born in 1957 or later. You may also need a 2nd dose. For females of childbearing age, rubella immunity should be determined. If there is no evidence of  immunity, females who are not pregnant should be vaccinated. If there is no  evidence of immunity, females who are pregnant should delay immunization until after pregnancy.  Pneumococcal 13-valent conjugate (PCV13) vaccine.** / Consult your health care provider.  Pneumococcal polysaccharide (PPSV23) vaccine.** / 1 to 2 doses if you smoke cigarettes or if you have certain conditions.  Meningococcal vaccine.** / 1 dose if you are age 8 to 37 years and a Market researcher living in a residence [place], or have one of several medical conditions, you need to get vaccinated against meningococcal disease. You may also need additional booster doses.  Hepatitis A vaccine.** / Consult your health care provider.  Hepatitis B vaccine.** / Consult your health care provider.  Haemophilus influenzae type b (Hib) vaccine.** / Consult your health care provider. Ages 12 to 80 years  Blood pressure check.** / Every year.  Lipid and cholesterol check.** / Every 5 years beginning at age 29 years.  Lung cancer screening. / Every year if you are aged 56-80 years and have a 30-pack-year history of smoking and currently smoke or have quit within the past 15 years. Yearly screening is stopped once you have quit smoking for at least 15 years or develop a health problem that would prevent you from having lung cancer treatment.  Clinical breast exam.** / Every year after age 90 years.  BRCA-related cancer risk assessment.** / For women who have family members with a BRCA-related cancer (breast, ovarian, tubal, or peritoneal cancers).  Mammogram.** / Every year beginning at age 67 years and continuing for as long as you are in good health. Consult with your health care provider.  Pap test.** / Every 3 years starting at age 63 years through age 50 or 69 years with a history of 3 consecutive normal Pap tests.  HPV screening.** / Every 3 years from ages 49 years through ages 51 to 41 years with a history of 3 consecutive normal Pap tests.  Fecal occult blood test (FOBT) of stool. /  Every year beginning at age 52 years and continuing until age 29 years. You may not need to do this test if you get a colonoscopy every 10 years.  Flexible sigmoidoscopy or colonoscopy.** / Every 5 years for a flexible sigmoidoscopy or every 10 years for a colonoscopy beginning at age 45 years and continuing until age 90 years.  Hepatitis C blood test.** / For all people born from [birth year] through 1965 and any individual with known risks for hepatitis C.  Skin self-exam. / Monthly.  Influenza vaccine. / Every year.  Tetanus, diphtheria, and acellular pertussis (Tdap/Td) vaccine.** / Consult your health care provider. Pregnant women should receive 1 dose of Tdap vaccine during each pregnancy. 1 dose of Td every 10 years.  Varicella vaccine.** / Consult your health care provider. Pregnant females who do not have evidence of immunity should receive the first dose after pregnancy.  Zoster vaccine.** / 1 dose for adults aged 4 years or older.  Measles, mumps, rubella (MMR) vaccine.** / You need at least 1 dose of MMR if you were born in 1957 or later. You may also need a second dose. For females of childbearing age, rubella immunity should be determined. If there is no evidence of immunity, females who are not pregnant should be vaccinated. If there is no evidence of immunity, females who are pregnant should delay immunization until after pregnancy.  Pneumococcal 13-valent conjugate (PCV13) vaccine.** / Consult your health care provider.  Pneumococcal polysaccharide (PPSV23) vaccine.** /  1 to 2 doses if you smoke cigarettes or if you have certain conditions.  Meningococcal vaccine.** / Consult your health care provider.  Hepatitis A vaccine.** / Consult your health care provider.  Hepatitis B vaccine.** / Consult your health care provider.  Haemophilus influenzae type b (Hib) vaccine.** / Consult your health care provider. Ages 41 years and over  Blood pressure check.** / Every year.  Lipid  and cholesterol check.** / Every 5 years beginning at age 16 years.  Lung cancer screening. / Every year if you are aged 53-80 years and have a 30-pack-year history of smoking and currently smoke or have quit within the past 15 years. Yearly screening is stopped once you have quit smoking for at least 15 years or develop a health problem that would prevent you from having lung cancer treatment.  Clinical breast exam.** / Every year after age 100 years.  BRCA-related cancer risk assessment.** / For women who have family members with a BRCA-related cancer (breast, ovarian, tubal, or peritoneal cancers).  Mammogram.** / Every year beginning at age 65 years and continuing for as long as you are in good health. Consult with your health care provider.  Pap test.** / Every 3 years starting at age 75 years through age 81 or 65 years with 3 consecutive normal Pap tests. Testing can be stopped between 65 and 70 years with 3 consecutive normal Pap tests and no abnormal Pap or HPV tests in the past 10 years.  HPV screening.** / Every 3 years from ages 53 years through ages 41 or 56 years with a history of 3 consecutive normal Pap tests. Testing can be stopped between 65 and 70 years with 3 consecutive normal Pap tests and no abnormal Pap or HPV tests in the past 10 years.  Fecal occult blood test (FOBT) of stool. / Every year beginning at age 66 years and continuing until age 93 years. You may not need to do this test if you get a colonoscopy every 10 years.  Flexible sigmoidoscopy or colonoscopy.** / Every 5 years for a flexible sigmoidoscopy or every 10 years for a colonoscopy beginning at age 79 years and continuing until age 56 years.  Hepatitis C blood test.** / For all people born from [birth year] through 1965 and any individual with known risks for hepatitis C.  Osteoporosis screening.** / A one-time screening for women ages 46 years and over and women at risk for fractures or osteoporosis.  Skin  self-exam. / Monthly.  Influenza vaccine. / Every year.  Tetanus, diphtheria, and acellular pertussis (Tdap/Td) vaccine.** / 1 dose of Td every 10 years.  Varicella vaccine.** / Consult your health care provider.  Zoster vaccine.** / 1 dose for adults aged 52 years or older.  Pneumococcal 13-valent conjugate (PCV13) vaccine.** / Consult your health care provider.  Pneumococcal polysaccharide (PPSV23) vaccine.** / 1 dose for all adults aged 4 years and older.  Meningococcal vaccine.** / Consult your health care provider.  Hepatitis A vaccine.** / Consult your health care provider.  Hepatitis B vaccine.** / Consult your health care provider.  Haemophilus influenzae type b (Hib) vaccine.** / Consult your health care provider. ** Family history and personal history of risk and conditions may change your health care provider's recommendations.   This information is not intended to replace advice given to you by your health care provider. Make sure you discuss any questions you have with your health care provider.   Document Released: 08/27/2001 Document Revised: 07/22/2014 Document Reviewed: 11/26/2010 Elsevier  Interactive Patient Education [employer].

## 2015-11-07 NOTE — Assessment & Plan Note (Signed)
Mildly low encouraged to add a 1000 IU supplement daily and reassess at next visit

## 2015-11-07 NOTE — Assessment & Plan Note (Signed)
Well controlled, no changes to meds. Encouraged heart healthy diet such as the DASH diet and exercise as tolerated.  °

## 2015-11-07 NOTE — Assessment & Plan Note (Signed)
Premarin cream only a small amount a couple times of week on labia with good results

## 2015-11-07 NOTE — Assessment & Plan Note (Signed)
Patient encouraged to maintain heart healthy diet, regular exercise, adequate sleep. Consider daily probiotics. Take medications as prescribed. Has advanced directives from [place] but is in the process of getting documents put together for [place], will get [place] a copy when complete

## 2015-11-07 NOTE — Assessment & Plan Note (Signed)
Has established with [facility] now and has elected to wait for repeat colonoscopy til 2021 unless concerning symptoms develop

## 2015-11-07 NOTE — Assessment & Plan Note (Signed)
Encouraged to get adequate exercise, calcium and vitamin d intake 

## 2015-11-07 NOTE — Assessment & Plan Note (Signed)
Doing well has very infrequent burning in esophagus. Uses Omeprazole very infrequently, can continue this use or can use Tums and/or Zantac. Has started [facility] with good results

## 2015-12-06 DIAGNOSIS — M9903 Segmental and somatic dysfunction of lumbar region: Secondary | ICD-10-CM | POA: Diagnosis not present

## 2015-12-06 DIAGNOSIS — M9901 Segmental and somatic dysfunction of cervical region: Secondary | ICD-10-CM | POA: Diagnosis not present

## 2015-12-06 DIAGNOSIS — M545 Low back pain: Secondary | ICD-10-CM | POA: Diagnosis not present

## 2015-12-06 DIAGNOSIS — M9904 Segmental and somatic dysfunction of sacral region: Secondary | ICD-10-CM | POA: Diagnosis not present

## 2016-01-01 ENCOUNTER — Other Ambulatory Visit: Payer: Self-pay | Admitting: Family Medicine

## 2016-01-03 DIAGNOSIS — M9903 Segmental and somatic dysfunction of lumbar region: Secondary | ICD-10-CM | POA: Diagnosis not present

## 2016-01-03 DIAGNOSIS — M545 Low back pain: Secondary | ICD-10-CM | POA: Diagnosis not present

## 2016-01-03 DIAGNOSIS — M9904 Segmental and somatic dysfunction of sacral region: Secondary | ICD-10-CM | POA: Diagnosis not present

## 2016-01-03 DIAGNOSIS — M9901 Segmental and somatic dysfunction of cervical region: Secondary | ICD-10-CM | POA: Diagnosis not present

## 2016-01-17 DIAGNOSIS — H26493 Other secondary cataract, bilateral: Secondary | ICD-10-CM | POA: Diagnosis not present

## 2016-01-17 DIAGNOSIS — H35372 Puckering of macula, left eye: Secondary | ICD-10-CM | POA: Diagnosis not present

## 2016-01-17 DIAGNOSIS — H52203 Unspecified astigmatism, bilateral: Secondary | ICD-10-CM | POA: Diagnosis not present

## 2016-01-31 DIAGNOSIS — M9902 Segmental and somatic dysfunction of thoracic region: Secondary | ICD-10-CM | POA: Diagnosis not present

## 2016-01-31 DIAGNOSIS — M542 Cervicalgia: Secondary | ICD-10-CM | POA: Diagnosis not present

## 2016-01-31 DIAGNOSIS — M9901 Segmental and somatic dysfunction of cervical region: Secondary | ICD-10-CM | POA: Diagnosis not present

## 2016-01-31 DIAGNOSIS — M9904 Segmental and somatic dysfunction of sacral region: Secondary | ICD-10-CM | POA: Diagnosis not present

## 2016-02-06 ENCOUNTER — Telehealth: Payer: Self-pay | Admitting: Family Medicine

## 2016-02-06 NOTE — Telephone Encounter (Signed)
Pt dropped off a copy of living will and health care POA, to be scanned in file, documents were given to [facility]

## 2016-02-28 DIAGNOSIS — M9904 Segmental and somatic dysfunction of sacral region: Secondary | ICD-10-CM | POA: Diagnosis not present

## 2016-02-28 DIAGNOSIS — M542 Cervicalgia: Secondary | ICD-10-CM | POA: Diagnosis not present

## 2016-02-28 DIAGNOSIS — M9901 Segmental and somatic dysfunction of cervical region: Secondary | ICD-10-CM | POA: Diagnosis not present

## 2016-02-28 DIAGNOSIS — M9902 Segmental and somatic dysfunction of thoracic region: Secondary | ICD-10-CM | POA: Diagnosis not present

## 2016-03-26 ENCOUNTER — Other Ambulatory Visit: Payer: Self-pay | Admitting: Family Medicine

## 2016-03-26 DIAGNOSIS — Z1231 Encounter for screening mammogram for malignant neoplasm of breast: Secondary | ICD-10-CM

## 2016-04-03 DIAGNOSIS — M542 Cervicalgia: Secondary | ICD-10-CM | POA: Diagnosis not present

## 2016-04-03 DIAGNOSIS — M9904 Segmental and somatic dysfunction of sacral region: Secondary | ICD-10-CM | POA: Diagnosis not present

## 2016-04-03 DIAGNOSIS — M9902 Segmental and somatic dysfunction of thoracic region: Secondary | ICD-10-CM | POA: Diagnosis not present

## 2016-04-03 DIAGNOSIS — M9901 Segmental and somatic dysfunction of cervical region: Secondary | ICD-10-CM | POA: Diagnosis not present

## 2016-04-23 ENCOUNTER — Other Ambulatory Visit: Payer: Self-pay | Admitting: Family Medicine

## 2016-04-26 ENCOUNTER — Inpatient Hospital Stay (HOSPITAL_BASED_OUTPATIENT_CLINIC_OR_DEPARTMENT_OTHER): Admission: RE | Admit: 2016-04-26 | Payer: Medicare Other | Source: Ambulatory Visit

## 2016-04-29 ENCOUNTER — Ambulatory Visit (HOSPITAL_BASED_OUTPATIENT_CLINIC_OR_DEPARTMENT_OTHER)
Admission: RE | Admit: 2016-04-29 | Discharge: 2016-04-29 | Disposition: A | Discharge status: Home / Self Care | Payer: Medicare Other | Source: Ambulatory Visit | Attending: Family Medicine | Admitting: Family Medicine

## 2016-04-29 DIAGNOSIS — Z1231 Encounter for screening mammogram for malignant neoplasm of breast: Secondary | ICD-10-CM | POA: Insufficient documentation

## 2016-05-01 DIAGNOSIS — M9904 Segmental and somatic dysfunction of sacral region: Secondary | ICD-10-CM | POA: Diagnosis not present

## 2016-05-01 DIAGNOSIS — M9902 Segmental and somatic dysfunction of thoracic region: Secondary | ICD-10-CM | POA: Diagnosis not present

## 2016-05-01 DIAGNOSIS — M9901 Segmental and somatic dysfunction of cervical region: Secondary | ICD-10-CM | POA: Diagnosis not present

## 2016-05-01 DIAGNOSIS — M542 Cervicalgia: Secondary | ICD-10-CM | POA: Diagnosis not present

## 2016-05-02 ENCOUNTER — Other Ambulatory Visit (INDEPENDENT_AMBULATORY_CARE_PROVIDER_SITE_OTHER): Payer: Medicare Other

## 2016-05-02 DIAGNOSIS — M810 Age-related osteoporosis without current pathological fracture: Secondary | ICD-10-CM

## 2016-05-02 DIAGNOSIS — I10 Essential (primary) hypertension: Secondary | ICD-10-CM

## 2016-05-02 DIAGNOSIS — E785 Hyperlipidemia, unspecified: Secondary | ICD-10-CM

## 2016-05-02 DIAGNOSIS — E559 Vitamin D deficiency, unspecified: Secondary | ICD-10-CM

## 2016-05-02 LAB — CBC
HCT: 42.2 % (ref 36.0–46.0)
HEMOGLOBIN: 14 g/dL (ref 12.0–15.0)
MCHC: 33.1 g/dL (ref 30.0–36.0)
MCV: 89.6 fl (ref 78.0–100.0)
PLATELETS: 252 10*3/uL (ref 150.0–400.0)
RBC: 4.71 Mil/uL (ref 3.87–5.11)
RDW: 14.3 % (ref 11.5–15.5)
WBC: 6.1 10*3/uL (ref 4.0–10.5)

## 2016-05-02 LAB — COMPREHENSIVE METABOLIC PANEL
ALT: 20 U/L (ref 0–35)
AST: 24 U/L (ref 0–37)
Albumin: 4.5 g/dL (ref 3.5–5.2)
Alkaline Phosphatase: 58 U/L (ref 39–117)
BUN: 15 mg/dL (ref 6–23)
CALCIUM: 9.3 mg/dL (ref 8.4–10.5)
CHLORIDE: 99 meq/L (ref 96–112)
CO2: 30 meq/L (ref 19–32)
Creatinine, Ser: 0.81 mg/dL (ref 0.40–1.20)
GFR: 73.3 mL/min (ref >60.00)
Glucose, Bld: 98 mg/dL (ref 70–99)
Potassium: 3.8 mEq/L (ref 3.5–5.1)
Sodium: 136 mEq/L (ref 135–145)
Total Bilirubin: 0.4 mg/dL (ref 0.2–1.2)
Total Protein: 7.6 g/dL (ref 6.0–8.3)

## 2016-05-02 LAB — LIPID PANEL
CHOL/HDL RATIO: 2
Cholesterol: 171 mg/dL (ref 0–200)
HDL: 77.8 mg/dL (ref >39.00)
LDL CALC: 78 mg/dL (ref 0–99)
NonHDL: 92.92
TRIGLYCERIDES: 75 mg/dL (ref 0.0–149.0)
VLDL: 15 mg/dL (ref 0.0–40.0)

## 2016-05-02 LAB — VITAMIN D 25 HYDROXY (VIT D DEFICIENCY, FRACTURES): VITD: 32.17 ng/mL (ref 30.00–100.00)

## 2016-05-02 LAB — TSH: TSH: 4.19 u[IU]/mL (ref 0.35–4.50)

## 2016-05-09 ENCOUNTER — Encounter: Payer: Self-pay | Admitting: Family Medicine

## 2016-05-09 ENCOUNTER — Ambulatory Visit (INDEPENDENT_AMBULATORY_CARE_PROVIDER_SITE_OTHER): Payer: Medicare Other | Admitting: Family Medicine

## 2016-05-09 VITALS — BP 130/68 | HR 76 | Temp 97.7°F | Resp 14 | Ht 60.0 in | Wt 137.0 lb

## 2016-05-09 DIAGNOSIS — Z0000 Encounter for general adult medical examination without abnormal findings: Secondary | ICD-10-CM | POA: Diagnosis not present

## 2016-05-09 DIAGNOSIS — M818 Other osteoporosis without current pathological fracture: Secondary | ICD-10-CM

## 2016-05-09 DIAGNOSIS — Z23 Encounter for immunization: Secondary | ICD-10-CM | POA: Diagnosis not present

## 2016-05-09 DIAGNOSIS — I10 Essential (primary) hypertension: Secondary | ICD-10-CM

## 2016-05-09 DIAGNOSIS — K219 Gastro-esophageal reflux disease without esophagitis: Secondary | ICD-10-CM | POA: Diagnosis not present

## 2016-05-09 DIAGNOSIS — E782 Mixed hyperlipidemia: Secondary | ICD-10-CM | POA: Diagnosis not present

## 2016-05-09 DIAGNOSIS — E559 Vitamin D deficiency, unspecified: Secondary | ICD-10-CM

## 2016-05-09 DIAGNOSIS — F419 Anxiety disorder, unspecified: Secondary | ICD-10-CM

## 2016-05-09 DIAGNOSIS — R32 Unspecified urinary incontinence: Secondary | ICD-10-CM

## 2016-05-09 NOTE — Assessment & Plan Note (Signed)
Brought in updated, ACP documents. Now has POA for their disabled son and their other son is the back up.

## 2016-05-09 NOTE — Progress Notes (Signed)
Subjective:   Terri Sandoval is a 75 y.o. female who presents for Medicare Annual (Subsequent) preventive examination.  Review of Systems:  No ROS.  Medicare Wellness Visit.  Cardiac Risk Factors include: advanced age (>58men, >57 women);dyslipidemia;hypertension  Sleep patterns: No sleep issues.    Home Safety/Smoke Alarms: Lives at home w/ husband in single story townhome. 'Mentally challenged' son who comes home every other weekend. Feels safe in home.    Living environment; residence and Firearm Safety: Husband has firearm in home, pt does not know where he keeps it but thinks he keeps it unloaded. Safe storage encouraged. Seat Belt Safety/Bike Helmet: Wears seat belt.   Counseling:   Eye Exam- Dr. Herbie Baltimore Da Margaretann Loveless every year or as needed. Wears reading glasses. Dental- Dr. Sheliah Plane every 6 months  Female:   Pap- Hysterectomy      Mammo- 04/29/16 BI-RADS CATEGORY  1: Negative.       Dexa scan- 05/22/15 Osteoporosis       CCS- 04/04/10, no polyps. Saw Dr. Havery Moros in Jan. 2017, 10 year recall per GI.      Objective:     Vitals: BP 130/68 (BP Location: Right Arm, Patient Position: Sitting, Cuff Size: Normal)   Pulse 76   Resp 14   Ht 5' (1.524 m)   Wt 137 lb (62.1 kg)   SpO2 96%   BMI 26.76 kg/m   Body mass index is 26.76 kg/m.   Tobacco History  Smoking Status  . Never Smoker  Smokeless Tobacco  . Never Used     Counseling given: Not Answered   Past Medical History:  Diagnosis Date  . Adrenal insufficiency ([place])   . Anxiety   . Arthritis   . Atrophic vaginitis 11/08/2013  . BPV (benign positional vertigo) 05/08/2015  . Chicken pox as a child  . Colon polyps    removed during colonscopy  . Diverticulitis   . Diverticulosis   . GERD (gastroesophageal reflux disease)   . Hyperlipidemia   . Hypertension   . Medicare annual wellness visit, subsequent 05/16/2013  . Osteoporosis, unspecified 03/09/2013  . Preventative health care 11/07/2015  . Skin  cancer    behind ear  . Urine incontinence    sometimes  . [place] 11/07/2015  . Vitamin D deficiency 11/07/2015   Past Surgical History:  Procedure Laterality Date  . ABDOMINAL HYSTERECTOMY  97   has ovaries  . BREAST BIOPSY Left     FNA  . CATARACT EXTRACTION  L 09/11/10, R 10/10/10  . [place]  . TONSILLECTOMY AND ADENOIDECTOMY  1990   Family History  Problem Relation Age of Onset  . Arthritis Mother   . Hyperlipidemia Mother   . Heart disease Mother   . Hypertension Mother   . Colon cancer Father   . Diabetes Sister     type 2  . Heart disease Sister   . Hypertension Sister   . Hyperlipidemia Sister   . Mental illness Maternal Uncle   . Stroke Maternal Grandmother   . Pneumonia Maternal Grandfather   . Cerebral palsy Son     MR  . Colon cancer Cousin    History  Sexual Activity  . Sexual activity: Yes    Comment: lives with husband and disabled son, no dietary restrictions, not exercising regularly    Outpatient Encounter Prescriptions as of 05/09/2016  Medication Sig  . conjugated estrogens (PREMARIN) vaginal cream Place vaginally daily. X  1 week then drop to twice weekly  . fish oil-omega-3 fatty acids 1000 MG capsule Take 1,200 mg by mouth daily.  Marland Kitchen FLUoxetine (PROZAC) 20 MG capsule TAKE 1 CAPSULE BY MOUTH EVERY DAY  . glucosamine-chondroitin 500-400 MG tablet Take 1 tablet by mouth 2 (two) times daily.   . Multiple Vitamin (MULTIVITAMIN) tablet Take 1 tablet by mouth daily.  Marland Kitchen neomycin-polymyxin-hydrocortisone (CORTISPORIN) otic solution Place 3 drops into both ears at bedtime. For 5 days prior to having flushing (Patient taking differently: Place 3 drops into both ears at bedtime as needed. For 5 days prior to having flushing)  . Probiotic Product (PROBIOTIC-10 PO) Take 1 capsule by mouth daily.  . ramipril (ALTACE) 10 MG capsule TAKE ONE CAPSULE BY MOUTH DAILY. STOP PREVIOUS SCRIPTS FOR THIS MEDICATIONS.  Marland Kitchen  simvastatin (ZOCOR) 20 MG tablet TAKE 1 TABLET BY MOUTH DAILY AT 6 PM  . [DISCONTINUED] Probiotic Product ([place]) CAPS Take 1 capsule by mouth daily.   No facility-administered encounter medications on file as of 05/09/2016.     Activities of Daily Living In your present state of health, do you have any difficulty performing the following activities: 05/09/2016  Hearing? N  Vision? N  Difficulty concentrating or making decisions? N  Walking or climbing stairs? N  Dressing or bathing? N  Doing errands, shopping? N  Preparing Food and eating ? N  Using the Toilet? N  In the past six months, have you accidently leaked urine? Y  Do you have problems with loss of bowel control? N  Managing your Medications? N  Managing your Finances? N  Housekeeping or managing your Housekeeping? N  Some recent data might be hidden    Patient Care Team: Mosie Lukes, MD as PCP - General (Family Medicine) Murvin Donning, MD (Dermatology) Manus Gunning, MD as Consulting Physician (Gastroenterology) Bethann Goo, DC as Referring Physician (Chiropractic Medicine) Linward Natal, MD as Consulting Physician (Ophthalmology)    Assessment:    Physical assessment deferred to PCP.  Exercise Activities and Dietary recommendations Current Exercise Habits: Home exercise routine, Type of exercise: walking, Intensity: Mild  Diet (meal preparation, eat out, water intake, caffeinated beverages, dairy products, fruits and vegetables): in general, a "healthy" diet  , well balanced, on average, 3 meals per day. Tries to drink lots of water. Drinks 1 iced tea daily. No coffee. Combination of eating at home and eating out. Breakfast: Fiber cereal w/ nuts or oatmeal w/ cranberries  Lunch: 'something kind of quick.' Spinach dip w/ pretzels, or 1/2 bagel w/ cream cheese Dinner: Fish w/ rice. Meat, veggie, starch.     Goals    . Increase physical activity      Fall Risk Fall Risk   05/09/2016 05/08/2015 10/31/2014 05/16/2013  Falls in the past year? No No No No   Depression Screen PHQ 2/9 Scores 05/09/2016 05/08/2015 10/31/2014 05/16/2013  PHQ - 2 Score 0 0 0 0     Cognitive Function MMSE - Mini Mental State Exam 05/09/2016  Orientation to time 5  Orientation to Place 5  Registration 3  Attention/ Calculation 5  Recall 3  Language- name 2 objects 2  Language- repeat 1  Language- follow 3 step command 3  Language- read & follow direction 1  Write a sentence 1  Copy design 1  Total score 30       Hearing Screening Comments: Able to hear conversational tones w/o difficulty. No issues reported. Vision Screening Comments: Sees Dr.  Da Margaretann Loveless yearly and PRN. Wears reading glasses. No issues reported.  Immunization History  Administered Date(s) Administered  . Influenza,inj,Quad PF,36+ Mos 04/13/2013, 04/20/2014, 05/08/2015  . Pneumococcal Conjugate-13 11/12/2013  . Pneumococcal Polysaccharide-23 04/25/2011  . Tdap 01/12/2010  . Zoster 07/16/2011   Screening Tests Health Maintenance  Topic Date Due  . INFLUENZA VACCINE  02/13/2016  . MAMMOGRAM  04/29/2018  . TETANUS/TDAP  01/13/2020  . COLONOSCOPY  04/04/2020  . DEXA SCAN  Completed  . ZOSTAVAX  Completed  . PNA vac Low Risk Adult  Completed      Plan:   Follow-up with Dr. Charlett Blake as directed.  Increase physical activity as tolerated.  Osteoporosis- Pt taking OTC multivitamin and Vit D supplement. Encouraged weight bearing exercise. Prolia has been recommended in the past, pt prefers to hold off on this if possible. Ongoing management per PCP.  During the course of the visit the patient was educated and counseled about the following appropriate screening and preventive services:   Vaccines to include Pneumoccal, Influenza, Hepatitis B, Td, Zostavax, HCV  Cardiovascular Disease  Colorectal cancer screening  Bone density screening  Diabetes screening  Glaucoma  screening  Mammography/PAP  Nutrition counseling   Patient Instructions (the written plan) was given to the patient.   Dorrene German, RN  05/09/2016  RN AWV note reviewed. Agree with documention and plan.

## 2016-05-09 NOTE — Assessment & Plan Note (Signed)
Encouraged to get adequate exercise, calcium and vitamin d intake 

## 2016-05-09 NOTE — Assessment & Plan Note (Addendum)
Tolerating statin, encouraged heart healthy diet, avoid trans fats, minimize simple carbs and saturated fats. Increase exercise as tolerated 

## 2016-05-09 NOTE — Assessment & Plan Note (Signed)
Now taking Vitamin D3 1000 IU daily. Level now WNL,

## 2016-05-09 NOTE — Assessment & Plan Note (Signed)
Well controlled, no changes to meds. Encouraged heart healthy diet such as the DASH diet and exercise as tolerated.  °

## 2016-05-09 NOTE — Assessment & Plan Note (Signed)
Taking 10 strain probiotic daily. Reports she is doing very well. She is off omeprazole and takes Zantac very rarely.

## 2016-05-09 NOTE — Assessment & Plan Note (Signed)
Using Premarin cream and avoiding caffeinated beverages. Pt reports symptoms much improved and only rarely experiences stress incontinence. Pt does not desire further workup at this time. Ongoing management per PCP.

## 2016-05-09 NOTE — Progress Notes (Signed)
Pre visit review using our clinic review tool, if applicable. No additional management support is needed unless otherwise documented below in the visit note. 

## 2016-05-12 NOTE — Assessment & Plan Note (Signed)
Is doing better since they were able to find a good home for her disabled adult son

## 2016-05-12 NOTE — Progress Notes (Signed)
Patient ID: Terri Sandoval, female   DOB: 27-Nov-1940, 75 y.o.   MRN: [id number]   Subjective:    Patient ID: Terri Sandoval, female    DOB: [birth year], 75 y.o.   MRN: [id number]  Chief Complaint  Patient presents with  . Medicare Wellness  . Osteoporosis    Taking MVI and Vit D 1000 daily. Prefers to hold off on Prolia if possible.   . Hypertension  . Hyperlipidemia  . Gastroesophageal Reflux    HPI Patient is in today for follow up. She is feeling well today. She did have one episode of feeling light headed last month but that has resolved. No associated symptoms, syncope or falls. Her anxiety is improved since her son was able to find a good home. Denies CP/palp/SOB/HA/congestion/fevers/GI or GU c/o. Taking meds as prescribed  Past Medical History:  Diagnosis Date  . Adrenal insufficiency ([place])   . Anxiety   . Arthritis   . Atrophic vaginitis 11/08/2013  . BPV (benign positional vertigo) 05/08/2015  . Chicken pox as a child  . Colon polyps    removed during colonscopy  . Diverticulitis   . Diverticulosis   . GERD (gastroesophageal reflux disease)   . Hyperlipidemia   . Hypertension   . Medicare annual wellness visit, subsequent 05/16/2013  . Osteoporosis, unspecified 03/09/2013  . Preventative health care 11/07/2015  . Skin cancer    behind ear  . Urine incontinence    sometimes  . Verruca 11/07/2015  . Vitamin D deficiency 11/07/2015    Past Surgical History:  Procedure Laterality Date  . ABDOMINAL HYSTERECTOMY  97   has ovaries  . BREAST BIOPSY Left     FNA  . CATARACT EXTRACTION  L 09/11/10, R 10/10/10  . [place]  . TONSILLECTOMY AND ADENOIDECTOMY  1990    Family History  Problem Relation Age of Onset  . Arthritis Mother   . Hyperlipidemia Mother   . Heart disease Mother   . Hypertension Mother   . Colon cancer Father   . Diabetes Sister     type 2  . Heart disease Sister   . Hypertension Sister   . Hyperlipidemia  Sister   . Mental illness Maternal Uncle   . Stroke Maternal Grandmother   . Pneumonia Maternal Grandfather   . Cerebral palsy Son     MR  . Colon cancer Cousin     Social History   Social History  . Marital status: Married    Spouse name: N/A  . Number of children: 2  . Years of education: N/A   Occupational History  . retired    Social History Main Topics  . Smoking status: Never Smoker  . Smokeless tobacco: Never Used  . Alcohol use No  . Drug use: No  . Sexual activity: Yes     Comment: lives with husband and disabled son, no dietary restrictions, not exercising regularly   Other Topics Concern  . Not on file   Social History Narrative  . No narrative on file    Outpatient Medications Prior to Visit  Medication Sig Dispense Refill  . conjugated estrogens (PREMARIN) vaginal cream Place vaginally daily. X 1 week then drop to twice weekly 42.5 g 2  . fish oil-omega-3 fatty acids 1000 MG capsule Take 1,200 mg by mouth daily.    Marland Kitchen FLUoxetine (PROZAC) 20 MG capsule TAKE 1 CAPSULE BY MOUTH EVERY DAY 90 capsule 0  . glucosamine-chondroitin  500-400 MG tablet Take 1 tablet by mouth 2 (two) times daily.     . Multiple Vitamin (MULTIVITAMIN) tablet Take 1 tablet by mouth daily.    Marland Kitchen neomycin-polymyxin-hydrocortisone (CORTISPORIN) otic solution Place 3 drops into both ears at bedtime. For 5 days prior to having flushing (Patient taking differently: Place 3 drops into both ears at bedtime as needed. For 5 days prior to having flushing) 10 mL 1  . ramipril (ALTACE) 10 MG capsule TAKE ONE CAPSULE BY MOUTH DAILY. STOP PREVIOUS SCRIPTS FOR THIS MEDICATIONS. 90 capsule 0  . simvastatin (ZOCOR) 20 MG tablet TAKE 1 TABLET BY MOUTH DAILY AT 6 PM 90 tablet 0  . Probiotic Product ([place]) CAPS Take 1 capsule by mouth daily.     No facility-administered medications prior to visit.     Allergies  Allergen Reactions  . Sulfamethoxazole-Trimethoprim Other (See Comments)     Very sore, sore in corner of lip    Review of Systems  Constitutional: Negative for chills, fever and malaise/fatigue.  HENT: Negative for congestion and hearing loss.   Eyes: Negative for discharge.  Respiratory: Negative for cough, sputum production and shortness of breath.   Cardiovascular: Negative for chest pain, palpitations and leg swelling.  Gastrointestinal: Negative for abdominal pain, blood in stool, constipation, diarrhea, heartburn, nausea and vomiting.  Genitourinary: Negative for dysuria, frequency, hematuria and urgency.  Musculoskeletal: Negative for back pain, falls and myalgias.  Skin: Negative for rash.  Neurological: Positive for dizziness. Negative for sensory change, loss of consciousness, weakness and headaches.  Endo/Heme/Allergies: Negative for environmental allergies. Does not bruise/bleed easily.  Psychiatric/Behavioral: Negative for depression and suicidal ideas. The patient is nervous/anxious. The patient does not have insomnia.        Objective:    Physical Exam  Constitutional: She is oriented to person, place, and time. She appears well-developed and well-nourished. No distress.  HENT:  Head: Normocephalic and atraumatic.  Eyes: Conjunctivae are normal.  Neck: Neck supple. No thyromegaly present.  Cardiovascular: Normal rate, regular rhythm and normal heart sounds.   No murmur heard. Pulmonary/Chest: Effort normal and breath sounds normal. No respiratory distress.  Abdominal: Soft. Bowel sounds are normal. She exhibits no distension and no mass. There is no tenderness.  Musculoskeletal: She exhibits no edema.  Lymphadenopathy:    She has no cervical adenopathy.  Neurological: She is alert and oriented to person, place, and time.  Skin: Skin is warm and dry.  Psychiatric: She has a normal mood and affect. Her behavior is normal.    BP 130/68 (BP Location: Right Arm, Patient Position: Sitting, Cuff Size: Normal)   Pulse 76   Temp 97.7 F (36.5  C) (Oral)   Resp 14   Ht 5' (1.524 m)   Wt 137 lb (62.1 kg)   SpO2 96%   BMI 26.76 kg/m  Wt Readings from Last 3 Encounters:  05/09/16 137 lb (62.1 kg)  11/07/15 136 lb 2 oz (61.7 kg)  08/01/15 136 lb 4 oz (61.8 kg)     Lab Results  Component Value Date   WBC 6.1 05/02/2016   HGB 14.0 05/02/2016   HCT 42.2 05/02/2016   PLT 252.0 05/02/2016   GLUCOSE 98 05/02/2016   CHOL 171 05/02/2016   TRIG 75.0 05/02/2016   HDL 77.80 05/02/2016   LDLCALC 78 05/02/2016   ALT 20 05/02/2016   AST 24 05/02/2016   NA 136 05/02/2016   K 3.8 05/02/2016   CL 99 05/02/2016   CREATININE  0.81 05/02/2016   BUN 15 05/02/2016   CO2 30 05/02/2016   TSH 4.19 05/02/2016    Lab Results  Component Value Date   TSH 4.19 05/02/2016   Lab Results  Component Value Date   WBC 6.1 05/02/2016   HGB 14.0 05/02/2016   HCT 42.2 05/02/2016   MCV 89.6 05/02/2016   PLT 252.0 05/02/2016   Lab Results  Component Value Date   NA 136 05/02/2016   K 3.8 05/02/2016   CO2 30 05/02/2016   GLUCOSE 98 05/02/2016   BUN 15 05/02/2016   CREATININE 0.81 05/02/2016   BILITOT 0.4 05/02/2016   ALKPHOS 58 05/02/2016   AST 24 05/02/2016   ALT 20 05/02/2016   PROT 7.6 05/02/2016   ALBUMIN 4.5 05/02/2016   CALCIUM 9.3 05/02/2016   GFR 73.30 05/02/2016   Lab Results  Component Value Date   CHOL 171 05/02/2016   Lab Results  Component Value Date   HDL 77.80 05/02/2016   Lab Results  Component Value Date   LDLCALC 78 05/02/2016   Lab Results  Component Value Date   TRIG 75.0 05/02/2016   Lab Results  Component Value Date   CHOLHDL 2 05/02/2016   No results found for: HGBA1C     Assessment & Plan:   Problem List Items Addressed This Visit    GERD (gastroesophageal reflux disease)    Taking 10 strain probiotic daily. Reports she is doing very well. She is off omeprazole and takes Zantac very rarely.      Relevant Medications   Probiotic Product (PROBIOTIC-10 PO)   Hypertension    Well  controlled, no changes to meds. Encouraged heart healthy diet such as the DASH diet and exercise as tolerated.       Relevant Orders   TSH   CBC   Comprehensive metabolic panel   Anxiety    Is doing better since they were able to find a good home for her disabled adult son       Hyperlipidemia    Tolerating statin, encouraged heart healthy diet, avoid trans fats, minimize simple carbs and saturated fats. Increase exercise as tolerated      Relevant Orders   Lipid panel   Urine incontinence    Using Premarin cream and avoiding caffeinated beverages. Pt reports symptoms much improved and only rarely experiences stress incontinence. Pt does not desire further workup at this time. Ongoing management per PCP.      Osteoporosis    Encouraged to get adequate exercise, calcium and vitamin d intake      Relevant Medications   Cholecalciferol (VITAMIN D3) 1000 units CAPS   Medicare annual wellness visit, subsequent   Vitamin D deficiency    Now taking Vitamin D3 1000 IU daily. Level now WNL,      Relevant Orders   VITAMIN D 25 Hydroxy (Vit-D Deficiency, Fractures)   Preventative health care    Brought in updated, ACP documents. Now has POA for their disabled son and their other son is the back up.        Other Visit Diagnoses    Encounter for Medicare annual wellness exam    -  Primary   Encounter for immunization       Relevant Medications   Probiotic Product (PROBIOTIC-10 PO)   Cholecalciferol (VITAMIN D3) 1000 units CAPS   Other Relevant Orders   Flu vaccine HIGH DOSE PF (Completed)      I have discontinued Ms. Gregory. I  am also having her maintain her glucosamine-chondroitin, fish oil-omega-3 fatty acids, multivitamin, neomycin-polymyxin-hydrocortisone, conjugated estrogens, FLUoxetine, simvastatin, ramipril, Probiotic Product (PROBIOTIC-10 PO), and Vitamin D3.  Meds ordered this encounter  Medications  . Probiotic Product (PROBIOTIC-10 PO)     Sig: Take 1 capsule by mouth daily.  . Cholecalciferol (VITAMIN D3) 1000 units CAPS    Sig: Take 1 capsule by mouth daily.     Penni Homans, MD

## 2016-05-12 NOTE — Assessment & Plan Note (Deleted)
Patient denies any difficulties at home. No trouble with ADLs, depression or falls. See EMR for functional status screen and depression screen. No recent changes to vision or hearing. Is UTD with immunizations. Is UTD with screening. Discussed Advanced Directives. Encouraged heart healthy diet, exercise as tolerated and adequate sleep. See patient's problem list for health risk factors to monitor. See AVS for preventative healthcare recommendation schedule. 

## 2016-05-31 DIAGNOSIS — M9902 Segmental and somatic dysfunction of thoracic region: Secondary | ICD-10-CM | POA: Diagnosis not present

## 2016-05-31 DIAGNOSIS — M542 Cervicalgia: Secondary | ICD-10-CM | POA: Diagnosis not present

## 2016-05-31 DIAGNOSIS — M9904 Segmental and somatic dysfunction of sacral region: Secondary | ICD-10-CM | POA: Diagnosis not present

## 2016-05-31 DIAGNOSIS — M9901 Segmental and somatic dysfunction of cervical region: Secondary | ICD-10-CM | POA: Diagnosis not present

## 2016-06-19 DIAGNOSIS — D485 Neoplasm of uncertain behavior of skin: Secondary | ICD-10-CM | POA: Diagnosis not present

## 2016-06-19 DIAGNOSIS — L980 Pyogenic granuloma: Secondary | ICD-10-CM | POA: Diagnosis not present

## 2016-06-24 ENCOUNTER — Other Ambulatory Visit: Payer: Self-pay | Admitting: Family Medicine

## 2016-07-01 DIAGNOSIS — M542 Cervicalgia: Secondary | ICD-10-CM | POA: Diagnosis not present

## 2016-07-01 DIAGNOSIS — M9904 Segmental and somatic dysfunction of sacral region: Secondary | ICD-10-CM | POA: Diagnosis not present

## 2016-07-01 DIAGNOSIS — M9901 Segmental and somatic dysfunction of cervical region: Secondary | ICD-10-CM | POA: Diagnosis not present

## 2016-07-01 DIAGNOSIS — M9902 Segmental and somatic dysfunction of thoracic region: Secondary | ICD-10-CM | POA: Diagnosis not present

## 2016-07-02 ENCOUNTER — Other Ambulatory Visit: Payer: Self-pay | Admitting: Family Medicine

## 2016-07-19 DIAGNOSIS — H5203 Hypermetropia, bilateral: Secondary | ICD-10-CM | POA: Diagnosis not present

## 2016-07-19 DIAGNOSIS — H16223 Keratoconjunctivitis sicca, not specified as Sjogren's, bilateral: Secondary | ICD-10-CM | POA: Diagnosis not present

## 2016-07-19 DIAGNOSIS — H26493 Other secondary cataract, bilateral: Secondary | ICD-10-CM | POA: Diagnosis not present

## 2016-07-19 DIAGNOSIS — H524 Presbyopia: Secondary | ICD-10-CM | POA: Diagnosis not present

## 2016-07-19 DIAGNOSIS — H35372 Puckering of macula, left eye: Secondary | ICD-10-CM | POA: Diagnosis not present

## 2016-07-23 ENCOUNTER — Other Ambulatory Visit: Payer: Self-pay | Admitting: Family Medicine

## 2016-08-07 DIAGNOSIS — M9901 Segmental and somatic dysfunction of cervical region: Secondary | ICD-10-CM | POA: Diagnosis not present

## 2016-08-07 DIAGNOSIS — M9904 Segmental and somatic dysfunction of sacral region: Secondary | ICD-10-CM | POA: Diagnosis not present

## 2016-08-07 DIAGNOSIS — M542 Cervicalgia: Secondary | ICD-10-CM | POA: Diagnosis not present

## 2016-08-07 DIAGNOSIS — M9902 Segmental and somatic dysfunction of thoracic region: Secondary | ICD-10-CM | POA: Diagnosis not present

## 2016-09-09 DIAGNOSIS — M9904 Segmental and somatic dysfunction of sacral region: Secondary | ICD-10-CM | POA: Diagnosis not present

## 2016-09-09 DIAGNOSIS — M9901 Segmental and somatic dysfunction of cervical region: Secondary | ICD-10-CM | POA: Diagnosis not present

## 2016-09-09 DIAGNOSIS — M9902 Segmental and somatic dysfunction of thoracic region: Secondary | ICD-10-CM | POA: Diagnosis not present

## 2016-09-09 DIAGNOSIS — M542 Cervicalgia: Secondary | ICD-10-CM | POA: Diagnosis not present

## 2016-09-24 ENCOUNTER — Other Ambulatory Visit: Payer: Self-pay | Admitting: Family Medicine

## 2016-10-21 ENCOUNTER — Other Ambulatory Visit: Payer: Self-pay | Admitting: Family Medicine

## 2016-10-31 ENCOUNTER — Other Ambulatory Visit (INDEPENDENT_AMBULATORY_CARE_PROVIDER_SITE_OTHER): Payer: Medicare Other

## 2016-10-31 DIAGNOSIS — I10 Essential (primary) hypertension: Secondary | ICD-10-CM

## 2016-10-31 LAB — COMPREHENSIVE METABOLIC PANEL
ALBUMIN: 4.1 g/dL (ref 3.5–5.2)
ALK PHOS: 52 U/L (ref 39–117)
ALT: 17 U/L (ref 0–35)
AST: 21 U/L (ref 0–37)
BILIRUBIN TOTAL: 0.3 mg/dL (ref 0.2–1.2)
BUN: 18 mg/dL (ref 6–23)
CALCIUM: 9.2 mg/dL (ref 8.4–10.5)
CO2: 30 mEq/L (ref 19–32)
CREATININE: 0.75 mg/dL (ref 0.40–1.20)
Chloride: 100 mEq/L (ref 96–112)
GFR: 80 mL/min (ref >60.00)
Glucose, Bld: 102 mg/dL — ABNORMAL HIGH (ref 70–99)
Potassium: 4.1 mEq/L (ref 3.5–5.1)
Sodium: 137 mEq/L (ref 135–145)
TOTAL PROTEIN: 7 g/dL (ref 6.0–8.3)

## 2016-10-31 LAB — CBC
HCT: 41.1 % (ref 36.0–46.0)
Hemoglobin: 13.6 g/dL (ref 12.0–15.0)
MCHC: 33.1 g/dL (ref 30.0–36.0)
MCV: 89 fl (ref 78.0–100.0)
Platelets: 222 10*3/uL (ref 150.0–400.0)
RBC: 4.62 Mil/uL (ref 3.87–5.11)
RDW: 14.5 % (ref 11.5–15.5)
WBC: 5.1 10*3/uL (ref 4.0–10.5)

## 2016-10-31 LAB — LIPID PANEL
CHOLESTEROL: 181 mg/dL (ref 0–200)
HDL: 73.6 mg/dL (ref >39.00)
LDL Cholesterol: 87 mg/dL (ref 0–99)
NonHDL: 106.95
Total CHOL/HDL Ratio: 2
Triglycerides: 99 mg/dL (ref 0.0–149.0)
VLDL: 19.8 mg/dL (ref 0.0–40.0)

## 2016-10-31 LAB — VITAMIN D 25 HYDROXY (VIT D DEFICIENCY, FRACTURES): VITD: 28.76 ng/mL — ABNORMAL LOW (ref 30.00–100.00)

## 2016-10-31 LAB — TSH: TSH: 3.29 u[IU]/mL (ref 0.35–4.50)

## 2016-11-07 ENCOUNTER — Ambulatory Visit (INDEPENDENT_AMBULATORY_CARE_PROVIDER_SITE_OTHER): Payer: Medicare Other | Admitting: Family Medicine

## 2016-11-07 ENCOUNTER — Encounter: Payer: Self-pay | Admitting: Family Medicine

## 2016-11-07 ENCOUNTER — Ambulatory Visit: Payer: Medicare Other | Admitting: Family Medicine

## 2016-11-07 VITALS — BP 131/57 | HR 62 | Temp 98.7°F | Wt 132.0 lb

## 2016-11-07 DIAGNOSIS — F419 Anxiety disorder, unspecified: Secondary | ICD-10-CM | POA: Diagnosis not present

## 2016-11-07 DIAGNOSIS — K219 Gastro-esophageal reflux disease without esophagitis: Secondary | ICD-10-CM | POA: Diagnosis not present

## 2016-11-07 DIAGNOSIS — E782 Mixed hyperlipidemia: Secondary | ICD-10-CM

## 2016-11-07 DIAGNOSIS — I10 Essential (primary) hypertension: Secondary | ICD-10-CM | POA: Diagnosis not present

## 2016-11-07 DIAGNOSIS — E559 Vitamin D deficiency, unspecified: Secondary | ICD-10-CM

## 2016-11-07 NOTE — Progress Notes (Signed)
Pre visit review using our clinic review tool, if applicable. No additional management support is needed unless otherwise documented below in the visit note. 

## 2016-11-07 NOTE — Patient Instructions (Signed)

## 2016-11-07 NOTE — Progress Notes (Signed)
Patient ID: MYRA WENG, female   DOB: [birth year], 76 y.o.   MRN: [id number]   Subjective:  I acted as a Education administrator for Penni Homans, MD. Ra[place]ord [place], [place]   Patient ID: [name], female    DOB: Jun 21, 1941, 76 y.o.   MRN: [id number]  Chief Complaint  Patient presents with  . Follow-up    F/U from [facility].    HPI  Patient is in today for a  follow up from [facility] Wellness Visit. Patient has a Hx of HTN, GERD, osteoporosis, Vitamin D Deficiency. Patient has no additional concerns noted at this time. She has been dealing with a great deal of stress secondary to her husband's very serious discitis. He has been in and out of the hospital as a result. Denies CP/palp/SOB/HA/congestion/fevers/GI or GU c/o. Taking meds as prescribed. She has a good support system and does not feel she needs more assistance at this time with her stress.  Patient Care Team: Mosie Lukes, MD as PCP - General (Family Medicine) Murvin Donning, MD (Dermatology) Manus Gunning, MD as Consulting Physician (Gastroenterology) Bethann Goo, DC as Referring Physician (Chiropractic Medicine) Linward Natal, MD as Consulting Physician (Ophthalmology)   Past Medical History:  Diagnosis Date  . Adrenal insufficiency ([place])   . Anxiety   . Arthritis   . Atrophic vaginitis 11/08/2013  . BPV (benign positional vertigo) 05/08/2015  . Chicken pox as a child  . Colon polyps    removed during colonscopy  . Diverticulitis   . Diverticulosis   . GERD (gastroesophageal reflux disease)   . Hyperlipidemia   . Hypertension   . Medicare annual wellness visit, subsequent 05/16/2013  . Osteoporosis, unspecified 03/09/2013  . Preventative health care 11/07/2015  . Skin cancer    behind ear  . Urine incontinence    sometimes  . Verruca 11/07/2015  . Vitamin D deficiency 11/07/2015    Past Surgical History:  Procedure Laterality Date  . ABDOMINAL HYSTERECTOMY  97   has ovaries  . BREAST BIOPSY Left     FNA  . CATARACT  EXTRACTION  L 09/11/10, R 10/10/10  . [place]  . TONSILLECTOMY AND ADENOIDECTOMY  1990    Family History  Problem Relation Age of Onset  . Arthritis Mother   . Hyperlipidemia Mother   . Heart disease Mother   . Hypertension Mother   . Colon cancer Father   . Diabetes Sister     type 2  . Heart disease Sister   . Hypertension Sister   . Hyperlipidemia Sister   . Mental illness Maternal Uncle   . Stroke Maternal Grandmother   . Pneumonia Maternal Grandfather   . Cerebral palsy Son     MR  . Colon cancer Cousin     Social History   Social History  . Marital status: Married    Spouse name: N/A  . Number of children: 2  . Years of education: N/A   Occupational History  . retired    Social History Main Topics  . Smoking status: Never Smoker  . Smokeless tobacco: Never Used  . Alcohol use No  . Drug use: No  . Sexual activity: Yes     Comment: lives with husband and disabled son, no dietary restrictions, not exercising regularly   Other Topics Concern  . Not on file   Social History Narrative  . No narrative on file    Outpatient Medications Prior to  Visit  Medication Sig Dispense Refill  . conjugated estrogens (PREMARIN) vaginal cream Place vaginally daily. X 1 week then drop to twice weekly 42.5 g 2  . fish oil-omega-3 fatty acids 1000 MG capsule Take 1,200 mg by mouth daily.    Marland Kitchen FLUoxetine (PROZAC) 20 MG capsule TAKE 1 CAPSULE BY MOUTH EVERY DAY 90 capsule 0  . glucosamine-chondroitin 500-400 MG tablet Take 1 tablet by mouth 2 (two) times daily.     . Multiple Vitamin (MULTIVITAMIN) tablet Take 1 tablet by mouth daily.    Marland Kitchen neomycin-polymyxin-hydrocortisone (CORTISPORIN) otic solution Place 3 drops into both ears at bedtime. For 5 days prior to having flushing (Patient taking differently: Place 3 drops into both ears at bedtime as needed. For 5 days prior to having flushing) 10 mL 1  . Probiotic Product (PROBIOTIC-10 PO)  Take 1 capsule by mouth daily.    . ramipril (ALTACE) 10 MG capsule TAKE ONE CAPSULE BY MOUTH DAILY. STOP PREVIOUS SCRIPTS FOR THIS MEDICATIONS. 90 capsule 0  . simvastatin (ZOCOR) 20 MG tablet TAKE 1 TABLET BY MOUTH DAILY AT 6 PM 90 tablet 0  . Cholecalciferol (VITAMIN D3) 1000 units CAPS Take 1 capsule by mouth daily.     No facility-administered medications prior to visit.     Allergies  Allergen Reactions  . Sulfamethoxazole-Trimethoprim Other (See Comments)    Very sore, sore in corner of lip    Review of Systems  Constitutional: Positive for malaise/fatigue. Negative for fever.  HENT: Negative for congestion.   Eyes: Negative for blurred vision.  Respiratory: Negative for cough and shortness of breath.   Cardiovascular: Negative for chest pain, palpitations and leg swelling.  Gastrointestinal: Negative for vomiting.  Musculoskeletal: Negative for back pain.  Skin: Negative for rash.  Neurological: Negative for loss of consciousness and headaches.  Psychiatric/Behavioral: The patient is nervous/anxious.        Objective:    Physical Exam  Constitutional: She is oriented to person, place, and time. She appears well-developed and well-nourished. No distress.  HENT:  Head: Normocephalic and atraumatic.  Eyes: Conjunctivae are normal.  Neck: Normal range of motion. No thyromegaly present.  Cardiovascular: Normal rate and regular rhythm.   Pulmonary/Chest: Effort normal and breath sounds normal. She has no wheezes.  Abdominal: Soft. Bowel sounds are normal. There is no tenderness.  Musculoskeletal: She exhibits no edema or deformity.  Neurological: She is alert and oriented to person, place, and time.  Skin: Skin is warm and dry. She is not diaphoretic.  Psychiatric: She has a normal mood and affect.    BP (!) 131/57 (BP Location: Left Arm, Patient Position: Sitting, Cuff Size: Normal)   Pulse 62   Temp 98.7 F (37.1 C) (Oral)   Wt 132 lb (59.9 kg)   SpO2 100%  Comment: RA  BMI 25.78 kg/m  Wt Readings from Last 3 Encounters:  11/07/16 132 lb (59.9 kg)  05/09/16 137 lb (62.1 kg)  11/07/15 136 lb 2 oz (61.7 kg)   BP Readings from Last 3 Encounters:  11/07/16 (!) 131/57  05/09/16 130/68  11/07/15 120/62     Immunization History  Administered Date(s) Administered  . Influenza, High Dose Seasonal PF 05/09/2016  . Influenza,inj,Quad PF,36+ Mos 04/13/2013, 04/20/2014, 05/08/2015  . Pneumococcal Conjugate-13 11/12/2013  . Pneumococcal Polysaccharide-23 04/25/2011  . Tdap 01/12/2010  . Zoster 07/16/2011    Health Maintenance  Topic Date Due  . INFLUENZA VACCINE  02/12/2017  . TETANUS/TDAP  01/13/2020  . COLONOSCOPY  04/04/2020  .  DEXA SCAN  Completed  . PNA vac Low Risk Adult  Completed    Lab Results  Component Value Date   WBC 5.1 10/31/2016   HGB 13.6 10/31/2016   HCT 41.1 10/31/2016   PLT 222.0 10/31/2016   GLUCOSE 102 (H) 10/31/2016   CHOL 181 10/31/2016   TRIG 99.0 10/31/2016   HDL 73.60 10/31/2016   LDLCALC 87 10/31/2016   ALT 17 10/31/2016   AST 21 10/31/2016   NA 137 10/31/2016   K 4.1 10/31/2016   CL 100 10/31/2016   CREATININE 0.75 10/31/2016   BUN 18 10/31/2016   CO2 30 10/31/2016   TSH 3.29 10/31/2016    Lab Results  Component Value Date   TSH 3.29 10/31/2016   Lab Results  Component Value Date   WBC 5.1 10/31/2016   HGB 13.6 10/31/2016   HCT 41.1 10/31/2016   MCV 89.0 10/31/2016   PLT 222.0 10/31/2016   Lab Results  Component Value Date   NA 137 10/31/2016   K 4.1 10/31/2016   CO2 30 10/31/2016   GLUCOSE 102 (H) 10/31/2016   BUN 18 10/31/2016   CREATININE 0.75 10/31/2016   BILITOT 0.3 10/31/2016   ALKPHOS 52 10/31/2016   AST 21 10/31/2016   ALT 17 10/31/2016   PROT 7.0 10/31/2016   ALBUMIN 4.1 10/31/2016   CALCIUM 9.2 10/31/2016   GFR 80.00 10/31/2016   Lab Results  Component Value Date   CHOL 181 10/31/2016   Lab Results  Component Value Date   HDL 73.60 10/31/2016   Lab  Results  Component Value Date   LDLCALC 87 10/31/2016   Lab Results  Component Value Date   TRIG 99.0 10/31/2016   Lab Results  Component Value Date   CHOLHDL 2 10/31/2016   No results found for: HGBA1C       Assessment & Plan:   Problem List Items Addressed This Visit    GERD (gastroesophageal reflux disease)    Doing well off PPI, using Zantac very infrequently, not even once a month. Continue to watch the diet      Hypertension - Primary    Well controlled, no changes to meds. Encouraged heart healthy diet such as the DASH diet and exercise as tolerated.       Relevant Orders   CBC   Comprehensive metabolic panel   TSH   Anxiety    Under a great deal of stress secondary to her husband's very serious acute illness but she is managing with support of family and friends. She will notify us if she needs further assistance.      Hyperlipidemia    Encouraged heart healthy diet, increase exercise, avoid trans fats, consider a krill oil cap daily      Relevant Orders   Lipid panel   Vitamin D deficiency    Taking daily supplements will continue to monitor      Relevant Orders   VITAMIN D 25 Hydroxy (Vit-D Deficiency, Fractures)      I am having Ms. Mecca maintain her glucosamine-chondroitin, fish oil-omega-3 fatty acids, multivitamin, neomycin-polymyxin-hydrocortisone, conjugated estrogens, Probiotic Product (PROBIOTIC-10 PO), FLUoxetine, simvastatin, ramipril, Vitamin D3, mupirocin ointment, and mupirocin ointment.  Meds ordered this encounter  Medications  . Cholecalciferol (VITAMIN D3) 1000 units CAPS    Sig: Take by mouth.  . mupirocin ointment (BACTROBAN) 2 %    Sig: Place 1 application into the nose 2 (two) times daily.  . mupirocin ointment (BACTROBAN) 2 %    Sig:  Place 1 application into the nose daily as needed.    Dispense:  22 g    Refill:  0    CMA served as scribe during this visit. History, Physical and Plan performed by medical provider.  Documentation and orders reviewed and attested to.  Penni Homans, MD

## 2016-11-07 NOTE — Assessment & Plan Note (Signed)
Doing well off PPI, using Zantac very infrequently, not even once a month. Continue to watch the diet

## 2016-11-10 NOTE — Assessment & Plan Note (Signed)
Well controlled, no changes to meds. Encouraged heart healthy diet such as the DASH diet and exercise as tolerated.  °

## 2016-11-10 NOTE — Assessment & Plan Note (Signed)
Taking daily supplements will continue to monitor

## 2016-11-10 NOTE — Assessment & Plan Note (Signed)
Encouraged heart healthy diet, increase exercise, avoid trans fats, consider a krill oil cap daily 

## 2016-11-10 NOTE — Assessment & Plan Note (Signed)
Under a great deal of stress secondary to her husband's very serious acute illness but she is managing with support of family and friends. She will notify us if she needs further assistance.

## 2016-11-13 ENCOUNTER — Encounter: Payer: Self-pay | Admitting: Family Medicine

## 2016-11-14 ENCOUNTER — Other Ambulatory Visit: Payer: Self-pay | Admitting: Family Medicine

## 2016-11-14 MED ORDER — VITAMIN D (ERGOCALCIFEROL) 1.25 MG (50000 UNIT) PO CAPS: 50000.0000 [IU] | ORAL_CAPSULE | ORAL | 0 refills | Status: DC

## 2016-12-11 DIAGNOSIS — M542 Cervicalgia: Secondary | ICD-10-CM | POA: Diagnosis not present

## 2016-12-11 DIAGNOSIS — M9902 Segmental and somatic dysfunction of thoracic region: Secondary | ICD-10-CM | POA: Diagnosis not present

## 2016-12-11 DIAGNOSIS — M9904 Segmental and somatic dysfunction of sacral region: Secondary | ICD-10-CM | POA: Diagnosis not present

## 2016-12-11 DIAGNOSIS — M9901 Segmental and somatic dysfunction of cervical region: Secondary | ICD-10-CM | POA: Diagnosis not present

## 2016-12-18 DIAGNOSIS — Z08 Encounter for follow-up examination after completed treatment for malignant neoplasm: Secondary | ICD-10-CM | POA: Diagnosis not present

## 2016-12-18 DIAGNOSIS — Z85828 Personal history of other malignant neoplasm of skin: Secondary | ICD-10-CM | POA: Diagnosis not present

## 2016-12-24 ENCOUNTER — Other Ambulatory Visit: Payer: Self-pay | Admitting: Family Medicine

## 2016-12-26 ENCOUNTER — Encounter: Payer: Self-pay | Admitting: Family Medicine

## 2016-12-27 ENCOUNTER — Other Ambulatory Visit: Payer: Self-pay | Admitting: Family Medicine

## 2016-12-27 DIAGNOSIS — H6121 Impacted cerumen, right ear: Secondary | ICD-10-CM

## 2016-12-27 MED ORDER — NEOMYCIN-POLYMYXIN-HC 3.5-10000-1 OT SOLN: 3.0000 [drp] | Freq: Every day | OTIC | 1 refills | Status: DC

## 2017-01-01 DIAGNOSIS — M9904 Segmental and somatic dysfunction of sacral region: Secondary | ICD-10-CM | POA: Diagnosis not present

## 2017-01-01 DIAGNOSIS — M9902 Segmental and somatic dysfunction of thoracic region: Secondary | ICD-10-CM | POA: Diagnosis not present

## 2017-01-01 DIAGNOSIS — M542 Cervicalgia: Secondary | ICD-10-CM | POA: Diagnosis not present

## 2017-01-01 DIAGNOSIS — M9901 Segmental and somatic dysfunction of cervical region: Secondary | ICD-10-CM | POA: Diagnosis not present

## 2017-01-17 DIAGNOSIS — H26493 Other secondary cataract, bilateral: Secondary | ICD-10-CM | POA: Diagnosis not present

## 2017-01-17 DIAGNOSIS — H35372 Puckering of macula, left eye: Secondary | ICD-10-CM | POA: Diagnosis not present

## 2017-01-20 ENCOUNTER — Other Ambulatory Visit: Payer: Self-pay | Admitting: Family Medicine

## 2017-01-27 DIAGNOSIS — M9901 Segmental and somatic dysfunction of cervical region: Secondary | ICD-10-CM | POA: Diagnosis not present

## 2017-01-27 DIAGNOSIS — M9902 Segmental and somatic dysfunction of thoracic region: Secondary | ICD-10-CM | POA: Diagnosis not present

## 2017-01-27 DIAGNOSIS — M9904 Segmental and somatic dysfunction of sacral region: Secondary | ICD-10-CM | POA: Diagnosis not present

## 2017-01-27 DIAGNOSIS — M542 Cervicalgia: Secondary | ICD-10-CM | POA: Diagnosis not present

## 2017-02-26 DIAGNOSIS — M9904 Segmental and somatic dysfunction of sacral region: Secondary | ICD-10-CM | POA: Diagnosis not present

## 2017-02-26 DIAGNOSIS — M9902 Segmental and somatic dysfunction of thoracic region: Secondary | ICD-10-CM | POA: Diagnosis not present

## 2017-02-26 DIAGNOSIS — M542 Cervicalgia: Secondary | ICD-10-CM | POA: Diagnosis not present

## 2017-02-26 DIAGNOSIS — M9901 Segmental and somatic dysfunction of cervical region: Secondary | ICD-10-CM | POA: Diagnosis not present

## 2017-03-18 ENCOUNTER — Other Ambulatory Visit: Payer: Self-pay | Admitting: Family Medicine

## 2017-03-18 DIAGNOSIS — Z1231 Encounter for screening mammogram for malignant neoplasm of breast: Secondary | ICD-10-CM

## 2017-03-24 ENCOUNTER — Other Ambulatory Visit: Payer: Self-pay | Admitting: Family Medicine

## 2017-03-26 DIAGNOSIS — M9904 Segmental and somatic dysfunction of sacral region: Secondary | ICD-10-CM | POA: Diagnosis not present

## 2017-03-26 DIAGNOSIS — M9901 Segmental and somatic dysfunction of cervical region: Secondary | ICD-10-CM | POA: Diagnosis not present

## 2017-03-26 DIAGNOSIS — M542 Cervicalgia: Secondary | ICD-10-CM | POA: Diagnosis not present

## 2017-03-26 DIAGNOSIS — M9902 Segmental and somatic dysfunction of thoracic region: Secondary | ICD-10-CM | POA: Diagnosis not present

## 2017-04-08 ENCOUNTER — Other Ambulatory Visit: Payer: Self-pay | Admitting: Family Medicine

## 2017-04-20 ENCOUNTER — Other Ambulatory Visit: Payer: Self-pay | Admitting: Family Medicine

## 2017-04-23 DIAGNOSIS — M542 Cervicalgia: Secondary | ICD-10-CM | POA: Diagnosis not present

## 2017-04-23 DIAGNOSIS — M9901 Segmental and somatic dysfunction of cervical region: Secondary | ICD-10-CM | POA: Diagnosis not present

## 2017-04-23 DIAGNOSIS — M9902 Segmental and somatic dysfunction of thoracic region: Secondary | ICD-10-CM | POA: Diagnosis not present

## 2017-04-23 DIAGNOSIS — M9904 Segmental and somatic dysfunction of sacral region: Secondary | ICD-10-CM | POA: Diagnosis not present

## 2017-05-01 ENCOUNTER — Ambulatory Visit (HOSPITAL_BASED_OUTPATIENT_CLINIC_OR_DEPARTMENT_OTHER)
Admission: RE | Admit: 2017-05-01 | Discharge: 2017-05-01 | Disposition: A | Discharge status: Home / Self Care | Payer: Medicare Other | Source: Ambulatory Visit | Attending: Family Medicine | Admitting: Family Medicine

## 2017-05-01 ENCOUNTER — Ambulatory Visit (HOSPITAL_BASED_OUTPATIENT_CLINIC_OR_DEPARTMENT_OTHER): Payer: Medicare Other

## 2017-05-01 DIAGNOSIS — Z1231 Encounter for screening mammogram for malignant neoplasm of breast: Secondary | ICD-10-CM | POA: Insufficient documentation

## 2017-05-08 ENCOUNTER — Other Ambulatory Visit (INDEPENDENT_AMBULATORY_CARE_PROVIDER_SITE_OTHER): Payer: Medicare Other

## 2017-05-08 DIAGNOSIS — E559 Vitamin D deficiency, unspecified: Secondary | ICD-10-CM

## 2017-05-08 DIAGNOSIS — E782 Mixed hyperlipidemia: Secondary | ICD-10-CM

## 2017-05-08 DIAGNOSIS — I10 Essential (primary) hypertension: Secondary | ICD-10-CM

## 2017-05-08 LAB — CBC
HCT: 42 % (ref 36.0–46.0)
Hemoglobin: 13.8 g/dL (ref 12.0–15.0)
MCHC: 32.8 g/dL (ref 30.0–36.0)
MCV: 91.3 fl (ref 78.0–100.0)
Platelets: 226 10*3/uL (ref 150.0–400.0)
RBC: 4.6 Mil/uL (ref 3.87–5.11)
RDW: 14.5 % (ref 11.5–15.5)
WBC: 4.3 10*3/uL (ref 4.0–10.5)

## 2017-05-08 LAB — COMPREHENSIVE METABOLIC PANEL
ALT: 15 U/L (ref 0–35)
AST: 21 U/L (ref 0–37)
Albumin: 4.2 g/dL (ref 3.5–5.2)
Alkaline Phosphatase: 58 U/L (ref 39–117)
BUN: 16 mg/dL (ref 6–23)
CALCIUM: 9.4 mg/dL (ref 8.4–10.5)
CHLORIDE: 99 meq/L (ref 96–112)
CO2: 31 meq/L (ref 19–32)
Creatinine, Ser: 0.76 mg/dL (ref 0.40–1.20)
GFR: 78.68 mL/min (ref >60.00)
Glucose, Bld: 100 mg/dL — ABNORMAL HIGH (ref 70–99)
Potassium: 3.9 mEq/L (ref 3.5–5.1)
Sodium: 137 mEq/L (ref 135–145)
Total Bilirubin: 0.4 mg/dL (ref 0.2–1.2)
Total Protein: 7.2 g/dL (ref 6.0–8.3)

## 2017-05-08 LAB — LIPID PANEL
CHOLESTEROL: 167 mg/dL (ref 0–200)
HDL: 72.9 mg/dL (ref >39.00)
LDL Cholesterol: 79 mg/dL (ref 0–99)
NonHDL: 94.54
TRIGLYCERIDES: 80 mg/dL (ref 0.0–149.0)
Total CHOL/HDL Ratio: 2
VLDL: 16 mg/dL (ref 0.0–40.0)

## 2017-05-08 LAB — TSH: TSH: 4.51 u[IU]/mL — ABNORMAL HIGH (ref 0.35–4.50)

## 2017-05-08 LAB — VITAMIN D 25 HYDROXY (VIT D DEFICIENCY, FRACTURES): VITD: 37.17 ng/mL (ref 30.00–100.00)

## 2017-05-15 ENCOUNTER — Ambulatory Visit: Payer: Medicare Other | Admitting: Family Medicine

## 2017-05-21 DIAGNOSIS — M9901 Segmental and somatic dysfunction of cervical region: Secondary | ICD-10-CM | POA: Diagnosis not present

## 2017-05-21 DIAGNOSIS — M542 Cervicalgia: Secondary | ICD-10-CM | POA: Diagnosis not present

## 2017-05-21 DIAGNOSIS — M9904 Segmental and somatic dysfunction of sacral region: Secondary | ICD-10-CM | POA: Diagnosis not present

## 2017-05-21 DIAGNOSIS — M9902 Segmental and somatic dysfunction of thoracic region: Secondary | ICD-10-CM | POA: Diagnosis not present

## 2017-05-22 ENCOUNTER — Ambulatory Visit (INDEPENDENT_AMBULATORY_CARE_PROVIDER_SITE_OTHER): Payer: Medicare Other | Admitting: Family Medicine

## 2017-05-22 ENCOUNTER — Encounter: Payer: Self-pay | Admitting: Family Medicine

## 2017-05-22 VITALS — BP 112/64 | HR 59 | Temp 97.9°F | Resp 18 | Wt 139.8 lb

## 2017-05-22 DIAGNOSIS — E559 Vitamin D deficiency, unspecified: Secondary | ICD-10-CM

## 2017-05-22 DIAGNOSIS — H6123 Impacted cerumen, bilateral: Secondary | ICD-10-CM | POA: Diagnosis not present

## 2017-05-22 DIAGNOSIS — E782 Mixed hyperlipidemia: Secondary | ICD-10-CM | POA: Diagnosis not present

## 2017-05-22 DIAGNOSIS — I10 Essential (primary) hypertension: Secondary | ICD-10-CM | POA: Diagnosis not present

## 2017-05-22 DIAGNOSIS — R946 Abnormal results of thyroid function studies: Secondary | ICD-10-CM

## 2017-05-22 DIAGNOSIS — Z23 Encounter for immunization: Secondary | ICD-10-CM | POA: Diagnosis not present

## 2017-05-22 LAB — T4, FREE: Free T4: 0.78 ng/dL (ref 0.60–1.60)

## 2017-05-22 NOTE — Assessment & Plan Note (Signed)
Encouraged heart healthy diet, increase exercise, avoid trans fats, consider a krill oil cap daily 

## 2017-05-22 NOTE — Assessment & Plan Note (Addendum)
Taking Vitamin D 2000 daily, level WNL

## 2017-05-22 NOTE — Assessment & Plan Note (Signed)
Well controlled, no changes to meds. Encouraged heart healthy diet such as the DASH diet and exercise as tolerated.  °

## 2017-05-22 NOTE — Patient Instructions (Addendum)
Shingrix is the new shingles shot, 2 shots over 2-6 months  Biotin and zinc can help hair regrow Hypertension Hypertension is another name for high blood pressure. High blood pressure forces your heart to work harder to pump blood. This can cause problems over time. There are two numbers in a blood pressure reading. There is a top number (systolic) over a bottom number (diastolic). It is best to have a blood pressure below 120/80. Healthy choices can help lower your blood pressure. You may need medicine to help lower your blood pressure if:  Your blood pressure cannot be lowered with healthy choices.  Your blood pressure is higher than 130/80.  Follow these instructions at home: Eating and drinking  If directed, follow the DASH eating plan. This diet includes: ? Filling half of your plate at each meal with fruits and vegetables. ? Filling one quarter of your plate at each meal with whole grains. Whole grains include whole wheat pasta, brown rice, and whole grain bread. ? Eating or drinking low-fat dairy products, such as skim milk or low-fat yogurt. ? Filling one quarter of your plate at each meal with low-fat (lean) proteins. Low-fat proteins include fish, skinless chicken, eggs, beans, and tofu. ? Avoiding fatty meat, cured and processed meat, or chicken with skin. ? Avoiding premade or processed food.  Eat less than 1,500 mg of salt (sodium) a day.  Limit alcohol use to no more than 1 drink a day for nonpregnant women and 2 drinks a day for men. One drink equals 12 oz of beer, 5 oz of wine, or 1 oz of hard liquor. Lifestyle  Work with your doctor to stay at a healthy weight or to lose weight. Ask your doctor what the best weight is for you.  Get at least 30 minutes of exercise that causes your heart to beat faster (aerobic exercise) most days of the week. This may include walking, swimming, or biking.  Get at least 30 minutes of exercise that strengthens your muscles (resistance  exercise) at least 3 days a week. This may include lifting weights or pilates.  Do not use any products that contain nicotine or tobacco. This includes cigarettes and e-cigarettes. If you need help quitting, ask your doctor.  Check your blood pressure at home as told by your doctor.  Keep all follow-up visits as told by your doctor. This is important. Medicines  Take over-the-counter and prescription medicines only as told by your doctor. Follow directions carefully.  Do not skip doses of blood pressure medicine. The medicine does not work as well if you skip doses. Skipping doses also puts you at risk for problems.  Ask your doctor about side effects or reactions to medicines that you should watch for. Contact a doctor if:  You think you are having a reaction to the medicine you are taking.  You have headaches that keep coming back (recurring).  You feel dizzy.  You have swelling in your ankles.  You have trouble with your vision. Get help right away if:  You get a very bad headache.  You start to feel confused.  You feel weak or numb.  You feel faint.  You get very bad pain in your: ? Chest. ? Belly (abdomen).  You throw up (vomit) more than once.  You have trouble breathing. Summary  Hypertension is another name for high blood pressure.  Making healthy choices can help lower blood pressure. If your blood pressure cannot be controlled with healthy choices, you may  need to take medicine. This information is not intended to replace advice given to you by your health care provider. Make sure you discuss any questions you have with your health care provider. Document Released: 12/18/2007 Document Revised: 05/29/2016 Document Reviewed: 05/29/2016 Elsevier Interactive Patient Education  [employer].

## 2017-05-22 NOTE — Progress Notes (Signed)
Subjective:  I acted as a Education administrator for Dr. Charlett Blake. [place], [place]  Patient ID: Terri Sandoval, female    DOB: 1941-04-22, 76 y.o.   MRN: [id number]  No chief complaint on file.   HPI  Patient is in today for a 6 month follow up and is feeling well. No recent febrile illness or hospitalizations. She is noting some fullness in her ears and wondering if she has ear wax again. No pain, itching or discharge. Continues to follow with chiropractor Dr Claude Manges. Denies CP/palp/SOB/HA/congestion/fevers/GI or GU c/o. Taking meds as prescribed  Patient Care Team: Mosie Lukes, MD as PCP - General (Family Medicine) Murvin Donning, MD (Dermatology) Armbruster, Carlota Raspberry, MD as Consulting Physician (Gastroenterology) Bethann Goo, [place] as Referring Physician (Chiropractic Medicine) Linward Natal, MD as Consulting Physician (Ophthalmology)   Past Medical History:  Diagnosis Date  . Adrenal insufficiency ([place])   . Anxiety   . Arthritis   . Atrophic vaginitis 11/08/2013  . BPV (benign positional vertigo) 05/08/2015  . Chicken pox as a child  . Colon polyps    removed during colonscopy  . Diverticulitis   . Diverticulosis   . GERD (gastroesophageal reflux disease)   . Hyperlipidemia   . Hypertension   . Medicare annual wellness visit, subsequent 05/16/2013  . Osteoporosis, unspecified 03/09/2013  . Preventative health care 11/07/2015  . Skin cancer    behind ear  . Urine incontinence    sometimes  . Verruca 11/07/2015  . Vitamin D deficiency 11/07/2015    Past Surgical History:  Procedure Laterality Date  . ABDOMINAL HYSTERECTOMY  97   has ovaries  . BREAST BIOPSY Left     FNA  . CATARACT EXTRACTION  L 09/11/10, R 10/10/10  . [place]  . TONSILLECTOMY AND ADENOIDECTOMY  1990    Family History  Problem Relation Age of Onset  . Arthritis Mother   . Hyperlipidemia Mother   . Heart disease Mother   . Hypertension Mother   . Colon cancer  Father   . Diabetes Sister        type 2  . Heart disease Sister   . Hypertension Sister   . Hyperlipidemia Sister   . Mental illness Maternal Uncle   . Stroke Maternal Grandmother   . Pneumonia Maternal Grandfather   . Cerebral palsy Son        MR  . Colon cancer Cousin     Social History   Socioeconomic History  . Marital status: Married    Spouse name: Not on file  . Number of children: 2  . Years of education: Not on file  . Highest education level: Not on file  Social Needs  . Financial resource strain: Not on file  . Food insecurity - worry: Not on file  . Food insecurity - inability: Not on file  . Transportation needs - medical: Not on file  . Transportation needs - non-medical: Not on file  Occupational History  . Occupation: retired  Tobacco Use  . Smoking status: Never Smoker  . Smokeless tobacco: Never Used  Substance and Sexual Activity  . Alcohol use: No  . Drug use: No  . Sexual activity: Yes    Comment: lives with husband and disabled son, no dietary restrictions, not exercising regularly  Other Topics Concern  . Not on file  Social History Narrative  . Not on file    Outpatient Medications Prior to Visit  Medication Sig Dispense Refill  . Cholecalciferol (VITAMIN D3) 1000 units CAPS Take by mouth.    . conjugated estrogens (PREMARIN) vaginal cream Place vaginally daily. X 1 week then drop to twice weekly 42.5 g 2  . fish oil-omega-3 fatty acids 1000 MG capsule Take 1,200 mg by mouth daily.    Marland Kitchen FLUoxetine (PROZAC) 20 MG capsule TAKE 1 CAPSULE BY MOUTH EVERY DAY 90 capsule 0  . glucosamine-chondroitin 500-400 MG tablet Take 1 tablet by mouth 2 (two) times daily.     . Multiple Vitamin (MULTIVITAMIN) tablet Take 1 tablet by mouth daily.    . mupirocin ointment (BACTROBAN) 2 % Apply 1 application daily as needed topically.  22 g 0  . neomycin-polymyxin-hydrocortisone (CORTISPORIN) otic solution Place 3 drops into both ears at bedtime. For 5 days  prior to having flushing (Patient taking differently: Place 3 drops into both ears at bedtime as needed. For 5 days prior to having flushing) 10 mL 1  . Probiotic Product (PROBIOTIC-10 PO) Take 1 capsule by mouth daily.    . ramipril (ALTACE) 10 MG capsule TAKE 1 CAPSULE BY MOUTH DAILY. STOP PREVIOUS SCRIPTS FOR THIS MEDICATIONS 90 capsule 0  . simvastatin (ZOCOR) 20 MG tablet TAKE 1 TABLET BY MOUTH DAILY AT 6 PM 90 tablet 0  . Vitamin D, Ergocalciferol, (DRISDOL) 50000 units CAPS capsule Take 1 capsule (50,000 Units total) by mouth every 7 (seven) days. 12 capsule 0  . mupirocin ointment (BACTROBAN) 2 % Place 1 application into the nose 2 (two) times daily.    Marland Kitchen neomycin-polymyxin-hydrocortisone (CORTISPORIN) OTIC solution Place 3 drops into both ears at bedtime. For 5 days prior to having flushing 10 mL 1   No facility-administered medications prior to visit.     Allergies  Allergen Reactions  . Sulfamethoxazole-Trimethoprim Other (See Comments)    Very sore, sore in corner of lip    Review of Systems  Constitutional: Negative for fever and malaise/fatigue.  HENT: Negative for congestion, ear discharge, ear pain and tinnitus.   Eyes: Negative for blurred vision.  Respiratory: Negative for cough and shortness of breath.   Cardiovascular: Negative for chest pain, palpitations and leg swelling.  Gastrointestinal: Negative for vomiting.  Musculoskeletal: Negative for back pain.  Skin: Negative for rash.  Neurological: Negative for loss of consciousness and headaches.       Objective:    Physical Exam  Constitutional: She is oriented to person, place, and time. She appears well-developed and well-nourished. No distress.  HENT:  Head: Normocephalic and atraumatic.  Eyes: Conjunctivae are normal.  Neck: Normal range of motion. No thyromegaly present.  Cardiovascular: Normal rate and regular rhythm.  Pulmonary/Chest: Effort normal and breath sounds normal. She has no wheezes.    Abdominal: Soft. Bowel sounds are normal. There is no tenderness.  Musculoskeletal: Normal range of motion. She exhibits no edema or deformity.  Neurological: She is alert and oriented to person, place, and time. No cranial nerve deficit.  Skin: Skin is warm and dry. She is not diaphoretic.  Psychiatric: She has a normal mood and affect.    BP 112/64 (BP Location: Left Arm, Patient Position: Sitting, Cuff Size: Normal)   Pulse (!) 59   Temp 97.9 F (36.6 C) (Oral)   Resp 18   Wt 139 lb 12.8 oz (63.4 kg)   SpO2 99%   BMI 27.30 kg/m  Wt Readings from Last 3 Encounters:  05/22/17 139 lb 12.8 oz (63.4 kg)  11/07/16 132 lb (59.9 kg)  05/09/16 137 lb (62.1 kg)   BP Readings from Last 3 Encounters:  05/22/17 112/64  11/07/16 (!) 131/57  05/09/16 130/68     Immunization History  Administered Date(s) Administered  . Influenza, High Dose Seasonal PF 05/09/2016, 05/22/2017  . Influenza,inj,Quad PF,6+ Mos 04/13/2013, 04/20/2014, 05/08/2015  . Pneumococcal Conjugate-13 11/12/2013  . Pneumococcal Polysaccharide-23 04/25/2011  . Tdap 01/12/2010  . Zoster 07/16/2011    Health Maintenance  Topic Date Due  . Samul Dada  01/13/2020  . COLONOSCOPY  04/04/2020  . INFLUENZA VACCINE  Completed  . DEXA SCAN  Completed  . PNA vac Low Risk Adult  Completed    Lab Results  Component Value Date   WBC 4.3 05/08/2017   HGB 13.8 05/08/2017   HCT 42.0 05/08/2017   PLT 226.0 05/08/2017   GLUCOSE 100 (H) 05/08/2017   CHOL 167 05/08/2017   TRIG 80.0 05/08/2017   HDL 72.90 05/08/2017   LDLCALC 79 05/08/2017   ALT 15 05/08/2017   AST 21 05/08/2017   NA 137 05/08/2017   K 3.9 05/08/2017   CL 99 05/08/2017   CREATININE 0.76 05/08/2017   BUN 16 05/08/2017   CO2 31 05/08/2017   TSH 4.51 (H) 05/08/2017    Lab Results  Component Value Date   TSH 4.51 (H) 05/08/2017   Lab Results  Component Value Date   WBC 4.3 05/08/2017   HGB 13.8 05/08/2017   HCT 42.0 05/08/2017   MCV 91.3  05/08/2017   PLT 226.0 05/08/2017   Lab Results  Component Value Date   NA 137 05/08/2017   K 3.9 05/08/2017   CO2 31 05/08/2017   GLUCOSE 100 (H) 05/08/2017   BUN 16 05/08/2017   CREATININE 0.76 05/08/2017   BILITOT 0.4 05/08/2017   ALKPHOS 58 05/08/2017   AST 21 05/08/2017   ALT 15 05/08/2017   PROT 7.2 05/08/2017   ALBUMIN 4.2 05/08/2017   CALCIUM 9.4 05/08/2017   GFR 78.68 05/08/2017   Lab Results  Component Value Date   CHOL 167 05/08/2017   Lab Results  Component Value Date   HDL 72.90 05/08/2017   Lab Results  Component Value Date   LDLCALC 79 05/08/2017   Lab Results  Component Value Date   TRIG 80.0 05/08/2017   Lab Results  Component Value Date   CHOLHDL 2 05/08/2017   No results found for: HGBA1C       Assessment & Plan:   Problem List Items Addressed This Visit    Hypertension    Well controlled, no changes to meds. Encouraged heart healthy diet such as the DASH diet and exercise as tolerated.       Hyperlipidemia    Encouraged heart healthy diet, increase exercise, avoid trans fats, consider a krill oil cap daily      Cerumen impaction    Left ear clear and right ear with mild cerumen noted. Can use H2O2 dropsqhs and if does not clear return for flushing      Vitamin D deficiency    Taking Vitamin D 2000 daily, level WNL      Abnormal results of thyroid function studies    TSH mildly elevated but free T4 is normal. Will monitor      Relevant Orders   T4, free (Completed)   TSH   T4, free    Other Visit Diagnoses    Needs flu shot    -  Primary   Relevant Orders   Flu vaccine HIGH DOSE PF (Fluzone  High dose) (Completed)      I am having Terri Sandoval maintain her glucosamine-chondroitin, fish oil-omega-3 fatty acids, multivitamin, neomycin-polymyxin-hydrocortisone, conjugated estrogens, Probiotic Product (PROBIOTIC-10 PO), Vitamin D3, mupirocin ointment, Vitamin D (Ergocalciferol), FLUoxetine, simvastatin, and  ramipril.  No orders of the defined types were placed in this encounter.   CMA served as Education administrator during this visit. History, Physical and Plan performed by medical provider. Documentation and orders reviewed and attested to.  Penni Homans, MD

## 2017-05-26 DIAGNOSIS — R946 Abnormal results of thyroid function studies: Secondary | ICD-10-CM | POA: Insufficient documentation

## 2017-05-26 NOTE — Assessment & Plan Note (Signed)
TSH mildly elevated but free T4 is normal. Will monitor

## 2017-05-26 NOTE — Assessment & Plan Note (Signed)
Left ear clear and right ear with mild cerumen noted. Can use H2O2 dropsqhs and if does not clear return for flushing

## 2017-06-16 ENCOUNTER — Other Ambulatory Visit: Payer: Self-pay | Admitting: Family Medicine

## 2017-06-18 DIAGNOSIS — M9904 Segmental and somatic dysfunction of sacral region: Secondary | ICD-10-CM | POA: Diagnosis not present

## 2017-06-18 DIAGNOSIS — M9901 Segmental and somatic dysfunction of cervical region: Secondary | ICD-10-CM | POA: Diagnosis not present

## 2017-06-18 DIAGNOSIS — M542 Cervicalgia: Secondary | ICD-10-CM | POA: Diagnosis not present

## 2017-06-18 DIAGNOSIS — M9902 Segmental and somatic dysfunction of thoracic region: Secondary | ICD-10-CM | POA: Diagnosis not present

## 2017-07-14 ENCOUNTER — Other Ambulatory Visit: Payer: Self-pay | Admitting: Family Medicine

## 2017-07-16 DIAGNOSIS — M542 Cervicalgia: Secondary | ICD-10-CM | POA: Diagnosis not present

## 2017-07-16 DIAGNOSIS — M9904 Segmental and somatic dysfunction of sacral region: Secondary | ICD-10-CM | POA: Diagnosis not present

## 2017-07-16 DIAGNOSIS — M9901 Segmental and somatic dysfunction of cervical region: Secondary | ICD-10-CM | POA: Diagnosis not present

## 2017-07-16 DIAGNOSIS — M9902 Segmental and somatic dysfunction of thoracic region: Secondary | ICD-10-CM | POA: Diagnosis not present

## 2017-07-17 NOTE — Progress Notes (Signed)
Subjective:   Terri Sandoval is a 77 y.o. female who presents for Medicare Annual (Subsequent) preventive examination.  Review of Systems:  No ROS.  Medicare Wellness Visit. Additional risk factors are reflected in the social history. Cardiac Risk Factors include: advanced age (>44men, >40 women);dyslipidemia;hypertension Sleep patterns: No issues. Feels rested. Home Safety/Smoke Alarms: Feels safe in home. Smoke alarms in place.   Lives with husband in 1 story home.  Female:      Mammo- last 05/01/17-normal       Dexa scan- last 05/22/15-osteoporosis        CCS- last pt reported 04/04/10. Due 2021.    Objective:     Vitals: BP 130/68 (BP Location: Left Arm, Patient Position: Sitting, Cuff Size: Normal)   Ht 5' (1.524 m)   Wt 138 lb (62.6 kg)   BMI 26.95 kg/m   Body mass index is 26.95 kg/m.  Advanced Directives 07/25/2017 05/09/2016 11/07/2015 05/08/2015  Does Patient Have a Medical Advance Directive? Yes Yes Yes Yes  Type of Paramedic of [place];Living will [place];Living will - [place];Living will  Does patient want to make changes to medical advance directive? - - - Yes - information given  Copy of [place] in Chart? Yes Yes - No - copy requested    Tobacco Social History   Tobacco Use  Smoking Status Never Smoker  Smokeless Tobacco Never Used     Counseling given: Not Answered   Clinical Intake: Pain : No/denies pain  Past Medical History:  Diagnosis Date  . Adrenal insufficiency ([place])   . Anxiety   . Arthritis   . Atrophic vaginitis 11/08/2013  . BPV (benign positional vertigo) 05/08/2015  . Chicken pox as a child  . Colon polyps    removed during colonscopy  . Diverticulitis   . Diverticulosis   . GERD (gastroesophageal reflux disease)   . Hyperlipidemia   . Hypertension   . Medicare annual wellness visit, subsequent 05/16/2013  . Osteoporosis, unspecified  03/09/2013  . Preventative health care 11/07/2015  . Skin cancer    behind ear  . Urine incontinence    sometimes  . Verruca 11/07/2015  . Vitamin D deficiency 11/07/2015   Past Surgical History:  Procedure Laterality Date  . ABDOMINAL HYSTERECTOMY  97   has ovaries  . BREAST BIOPSY Left     FNA  . CATARACT EXTRACTION  L 09/11/10, R 10/10/10  . [place]  . TONSILLECTOMY AND ADENOIDECTOMY  1990   Family History  Problem Relation Age of Onset  . Arthritis Mother   . Hyperlipidemia Mother   . Heart disease Mother   . Hypertension Mother   . Colon cancer Father   . Diabetes Sister        type 2  . Heart disease Sister   . Hypertension Sister   . Hyperlipidemia Sister   . Mental illness Maternal Uncle   . Stroke Maternal Grandmother   . Pneumonia Maternal Grandfather   . Cerebral palsy Son        MR  . Colon cancer Cousin    Social History   Socioeconomic History  . Marital status: Married    Spouse name: None  . Number of children: 2  . Years of education: None  . Highest education level: None  Social Needs  . Financial resource strain: None  . Food insecurity - worry: None  .  Food insecurity - inability: None  . Transportation needs - medical: None  . Transportation needs - non-medical: None  Occupational History  . Occupation: retired  Tobacco Use  . Smoking status: Never Smoker  . Smokeless tobacco: Never Used  Substance and Sexual Activity  . Alcohol use: No  . Drug use: No  . Sexual activity: No    Comment: lives with husband and disabled son, no dietary restrictions, not exercising regularly  Other Topics Concern  . None  Social History Narrative  . None    Outpatient Encounter Medications as of 07/25/2017  Medication Sig  . Cholecalciferol (VITAMIN D3) 1000 units CAPS Take by mouth.  . conjugated estrogens (PREMARIN) vaginal cream Place vaginally daily. X 1 week then drop to twice weekly  . fish oil-omega-3  fatty acids 1000 MG capsule Take 1,200 mg by mouth daily.  Marland Kitchen FLUoxetine (PROZAC) 20 MG capsule TAKE 1 CAPSULE BY MOUTH EVERY DAY  . Multiple Vitamin (MULTIVITAMIN) tablet Take 1 tablet by mouth daily.  . mupirocin ointment (BACTROBAN) 2 % Apply 1 application daily as needed topically.   Marland Kitchen neomycin-polymyxin-hydrocortisone (CORTISPORIN) otic solution Place 3 drops into both ears at bedtime. For 5 days prior to having flushing (Patient taking differently: Place 3 drops into both ears at bedtime as needed. For 5 days prior to having flushing)  . Probiotic Product (PROBIOTIC-10 PO) Take 1 capsule by mouth daily.  . ramipril (ALTACE) 10 MG capsule TAKE 1 CAPSULE BY MOUTH DAILY. STOP PREVIOUS SCRIPTS FOR THIS MEDICATIONS (Patient taking differently: TAKE 1 CAPSULE BY MOUTH DAILY.)  . simvastatin (ZOCOR) 20 MG tablet TAKE 1 TABLET BY MOUTH DAILY AT 6 PM  . [DISCONTINUED] Vitamin D, Ergocalciferol, (DRISDOL) 50000 units CAPS capsule Take 1 capsule (50,000 Units total) by mouth every 7 (seven) days.  Marland Kitchen glucosamine-chondroitin 500-400 MG tablet Take 1 tablet by mouth 2 (two) times daily.    No facility-administered encounter medications on file as of 07/25/2017.     Activities of Daily Living In your present state of health, do you have any difficulty performing the following activities: 07/25/2017  Hearing? N  Vision? N  Comment wears glasses for reading. Capsulectomy scheduled for 07/28/17. Dr.Da Margaretann Loveless  Difficulty concentrating or making decisions? N  Walking or climbing stairs? N  Dressing or bathing? N  Doing errands, shopping? N  Preparing Food and eating ? N  Using the Toilet? N  In the past six months, have you accidently leaked urine? Y  Comment wears pads  Do you have problems with loss of bowel control? N  Managing your Medications? N  Managing your Finances? N  Housekeeping or managing your Housekeeping? N  Comment has help 1x/ month  Some recent data might be hidden    Patient Care  Team: Mosie Lukes, MD as PCP - General (Family Medicine) Murvin Donning, MD (Dermatology) Armbruster, Carlota Raspberry, MD as Consulting Physician (Gastroenterology) Bethann Goo, [place] as Referring Physician (Chiropractic Medicine) Linward Natal, MD as Consulting Physician (Ophthalmology)    Assessment:   This is a routine wellness examination for [place].Physical assessment deferred to PCP.  Exercise Activities and Dietary recommendations Current Exercise Habits: The patient does not participate in regular exercise at present, Exercise limited by: None identified Diet (meal preparation, eat out, water intake, caffeinated beverages, dairy products, fruits and vegetables): in general, a "healthy" diet  , well balanced   Goals    . Maintain current health (pt-stated)       Fall Risk  Fall Risk  07/25/2017 05/09/2016 05/08/2015 10/31/2014 05/16/2013  Falls in the past year? No No No No No   Depression Screen PHQ 2/9 Scores 07/25/2017 05/09/2016 05/08/2015 10/31/2014  PHQ - 2 Score 0 0 0 0     Cognitive Function MMSE - Mini Mental State Exam 07/25/2017 05/09/2016  Orientation to time 5 5  Orientation to Place 5 5  Registration 3 3  Attention/ Calculation 5 5  Recall 2 3  Language- name 2 objects 2 2  Language- repeat 1 1  Language- follow 3 step command 3 3  Language- read & follow direction 1 1  Write a sentence 1 1  Copy design 1 1  Total score 29 30        Immunization History  Administered Date(s) Administered  . Influenza, High Dose Seasonal PF 05/09/2016, 05/22/2017  . Influenza,inj,Quad PF,6+ Mos 04/13/2013, 04/20/2014, 05/08/2015  . Pneumococcal Conjugate-13 11/12/2013  . Pneumococcal Polysaccharide-23 04/25/2011  . Tdap 01/12/2010  . Zoster 07/16/2011   Screening Tests Health Maintenance  Topic Date Due  . TETANUS/TDAP  01/13/2020  . INFLUENZA VACCINE  Completed  . DEXA SCAN  Completed  . PNA vac Low Risk Adult  Completed      Plan:   Follow up with  Dr.Blyth as scheduled 12/04/17.  Continue to eat heart healthy diet (full of fruits, vegetables, whole grains, lean protein, water--limit salt, fat, and sugar intake) and increase physical activity as tolerated.  Continue doing brain stimulating activities (puzzles, reading, adult coloring books, staying active) to keep memory sharp.   I have personally reviewed and noted the following in the patient's chart:   . Medical and social history . Use of alcohol, tobacco or illicit drugs  . Current medications and supplements . Functional ability and status . Nutritional status . Physical activity . Advanced directives . List of other physicians . Hospitalizations, surgeries, and ER visits in previous 12 months . Vitals . Screenings to include cognitive, depression, and falls . Referrals and appointments  In addition, I have reviewed and discussed with patient certain preventive protocols, quality metrics, and best practice recommendations. A written personalized care plan for preventive services as well as general preventive health recommendations were provided to patient.     Shela Nevin, [place]  07/25/2017

## 2017-07-23 DIAGNOSIS — H35372 Puckering of macula, left eye: Secondary | ICD-10-CM | POA: Diagnosis not present

## 2017-07-23 DIAGNOSIS — H524 Presbyopia: Secondary | ICD-10-CM | POA: Diagnosis not present

## 2017-07-23 DIAGNOSIS — H26493 Other secondary cataract, bilateral: Secondary | ICD-10-CM | POA: Diagnosis not present

## 2017-07-23 DIAGNOSIS — H04123 Dry eye syndrome of bilateral lacrimal glands: Secondary | ICD-10-CM | POA: Diagnosis not present

## 2017-07-23 DIAGNOSIS — H52203 Unspecified astigmatism, bilateral: Secondary | ICD-10-CM | POA: Diagnosis not present

## 2017-07-23 DIAGNOSIS — Z961 Presence of intraocular lens: Secondary | ICD-10-CM | POA: Diagnosis not present

## 2017-07-25 ENCOUNTER — Encounter: Payer: Self-pay | Admitting: *Deleted

## 2017-07-25 ENCOUNTER — Ambulatory Visit (INDEPENDENT_AMBULATORY_CARE_PROVIDER_SITE_OTHER): Payer: Medicare Other | Admitting: *Deleted

## 2017-07-25 VITALS — BP 130/68 | HR 72 | Ht 60.0 in | Wt 138.0 lb

## 2017-07-25 DIAGNOSIS — Z0000 Encounter for general adult medical examination without abnormal findings: Secondary | ICD-10-CM

## 2017-07-25 NOTE — Patient Instructions (Signed)
Follow up with Dr.Blyth as scheduled 12/04/17.  Continue to eat heart healthy diet (full of fruits, vegetables, whole grains, lean protein, water--limit salt, fat, and sugar intake) and increase physical activity as tolerated.  Continue doing brain stimulating activities (puzzles, reading, adult coloring books, staying active) to keep memory sharp.    Terri Sandoval , Thank you for taking time to come for your Medicare Wellness Visit. I appreciate your ongoing commitment to your health goals. Please review the following plan we discussed and let me know if I can assist you in the future.   These are the goals we discussed: Goals    . Maintain current health (pt-stated)       This is a list of the screening recommended for you and due dates:  Health Maintenance  Topic Date Due  . Tetanus Vaccine  01/13/2020  . Flu Shot  Completed  . DEXA scan (bone density measurement)  Completed  . Pneumonia vaccines  Completed    Health Maintenance for Postmenopausal Women Menopause is a normal process in which your reproductive ability comes to an end. This process happens gradually over a span of months to years, usually between the ages of 40 and 34. Menopause is complete when you have missed 12 consecutive menstrual periods. It is important to talk with your health care provider about some of the most common conditions that affect postmenopausal women, such as heart disease, cancer, and bone loss (osteoporosis). Adopting a healthy lifestyle and getting preventive care can help to promote your health and wellness. Those actions can also lower your chances of developing some of these common conditions. What should I know about menopause? During menopause, you may experience a number of symptoms, such as:  Moderate-to-severe hot flashes.  Night sweats.  Decrease in sex drive.  Mood swings.  Headaches.  Tiredness.  Irritability.  Memory problems.  Insomnia.  Choosing to treat or not to treat  menopausal changes is an individual decision that you make with your health care provider. What should I know about hormone replacement therapy and supplements? Hormone therapy products are effective for treating symptoms that are associated with menopause, such as hot flashes and night sweats. Hormone replacement carries certain risks, especially as you become older. If you are thinking about using estrogen or estrogen with progestin treatments, discuss the benefits and risks with your health care provider. What should I know about heart disease and stroke? Heart disease, heart attack, and stroke become more likely as you age. This may be due, in part, to the hormonal changes that your body experiences during menopause. These can affect how your body processes dietary fats, triglycerides, and cholesterol. Heart attack and stroke are both medical emergencies. There are many things that you can do to help prevent heart disease and stroke:  Have your blood pressure checked at least every 1-2 years. High blood pressure causes heart disease and increases the risk of stroke.  If you are 34-65 years old, ask your health care provider if you should take aspirin to prevent a heart attack or a stroke.  Do not use any tobacco products, including cigarettes, chewing tobacco, or electronic cigarettes. If you need help quitting, ask your health care provider.  It is important to eat a healthy diet and maintain a healthy weight. ? Be sure to include plenty of vegetables, fruits, low-fat dairy products, and lean protein. ? Avoid eating foods that are high in solid fats, added sugars, or salt (sodium).  Get regular exercise. This  is one of the most important things that you can do for your health. ? Try to exercise for at least 150 minutes each week. The type of exercise that you do should increase your heart rate and make you sweat. This is known as moderate-intensity exercise. ? Try to do strengthening  exercises at least twice each week. Do these in addition to the moderate-intensity exercise.  Know your numbers.Ask your health care provider to check your cholesterol and your blood glucose. Continue to have your blood tested as directed by your health care provider.  What should I know about cancer screening? There are several types of cancer. Take the following steps to reduce your risk and to catch any cancer development as early as possible. Breast Cancer  Practice breast self-awareness. ? This means understanding how your breasts normally appear and feel. ? It also means doing regular breast self-exams. Let your health care provider know about any changes, no matter how small.  If you are 66 or older, have a clinician do a breast exam (clinical breast exam or CBE) every year. Depending on your age, family history, and medical history, it may be recommended that you also have a yearly breast X-ray (mammogram).  If you have a family history of breast cancer, talk with your health care provider about genetic screening.  If you are at high risk for breast cancer, talk with your health care provider about having an MRI and a mammogram every year.  Breast cancer (BRCA) gene test is recommended for women who have family members with BRCA-related cancers. Results of the assessment will determine the need for genetic counseling and BRCA1 and for BRCA2 testing. BRCA-related cancers include these types: ? Breast. This occurs in males or females. ? Ovarian. ? Tubal. This may also be called fallopian tube cancer. ? Cancer of the abdominal or pelvic lining (peritoneal cancer). ? Prostate. ? Pancreatic.  Cervical, Uterine, and Ovarian Cancer Your health care provider may recommend that you be screened regularly for cancer of the pelvic organs. These include your ovaries, uterus, and vagina. This screening involves a pelvic exam, which includes checking for microscopic changes to the surface of  your cervix (Pap test).  For women ages 21-65, health care providers may recommend a pelvic exam and a Pap test every three years. For women ages 61-65, they may recommend the Pap test and pelvic exam, combined with testing for human papilloma virus (HPV), every five years. Some types of HPV increase your risk of cervical cancer. Testing for HPV may also be done on women of any age who have unclear Pap test results.  Other health care providers may not recommend any screening for nonpregnant women who are considered low risk for pelvic cancer and have no symptoms. Ask your health care provider if a screening pelvic exam is right for you.  If you have had past treatment for cervical cancer or a condition that could lead to cancer, you need Pap tests and screening for cancer for at least 20 years after your treatment. If Pap tests have been discontinued for you, your risk factors (such as having a new sexual partner) need to be reassessed to determine if you should start having screenings again. Some women have medical problems that increase the chance of getting cervical cancer. In these cases, your health care provider may recommend that you have screening and Pap tests more often.  If you have a family history of uterine cancer or ovarian cancer, talk with your  health care provider about genetic screening.  If you have vaginal bleeding after reaching menopause, tell your health care provider.  There are currently no reliable tests available to screen for ovarian cancer.  Lung Cancer Lung cancer screening is recommended for adults 78-42 years old who are at high risk for lung cancer because of a history of smoking. A yearly low-dose CT scan of the lungs is recommended if you:  Currently smoke.  Have a history of at least 30 pack-years of smoking and you currently smoke or have quit within the past 15 years. A pack-year is smoking an average of one pack of cigarettes per day for one year.  Yearly  screening should:  Continue until it has been 15 years since you quit.  Stop if you develop a health problem that would prevent you from having lung cancer treatment.  Colorectal Cancer  This type of cancer can be detected and can often be prevented.  Routine colorectal cancer screening usually begins at age 73 and continues through age 59.  If you have risk factors for colon cancer, your health care provider may recommend that you be screened at an earlier age.  If you have a family history of colorectal cancer, talk with your health care provider about genetic screening.  Your health care provider may also recommend using home test kits to check for hidden blood in your stool.  A small camera at the end of a tube can be used to examine your colon directly (sigmoidoscopy or colonoscopy). This is done to check for the earliest forms of colorectal cancer.  Direct examination of the colon should be repeated every 5-10 years until age 34. However, if early forms of precancerous polyps or small growths are found or if you have a family history or genetic risk for colorectal cancer, you may need to be screened more often.  Skin Cancer  Check your skin from head to toe regularly.  Monitor any moles. Be sure to tell your health care provider: ? About any new moles or changes in moles, especially if there is a change in a mole's shape or color. ? If you have a mole that is larger than the size of a pencil eraser.  If any of your family members has a history of skin cancer, especially at a young age, talk with your health care provider about genetic screening.  Always use sunscreen. Apply sunscreen liberally and repeatedly throughout the day.  Whenever you are outside, protect yourself by wearing long sleeves, pants, a wide-brimmed hat, and sunglasses.  What should I know about osteoporosis? Osteoporosis is a condition in which bone destruction happens more quickly than new bone creation.  After menopause, you may be at an increased risk for osteoporosis. To help prevent osteoporosis or the bone fractures that can happen because of osteoporosis, the following is recommended:  If you are 33-10 years old, get at least 1,000 mg of calcium and at least 600 mg of vitamin D per day.  If you are older than age 15 but younger than age 46, get at least 1,200 mg of calcium and at least 600 mg of vitamin D per day.  If you are older than age 46, get at least 1,200 mg of calcium and at least 800 mg of vitamin D per day.  Smoking and excessive alcohol intake increase the risk of osteoporosis. Eat foods that are rich in calcium and vitamin D, and do weight-bearing exercises several times each week as directed  by your health care provider. What should I know about how menopause affects my mental health? Depression may occur at any age, but it is more common as you become older. Common symptoms of depression include:  Low or sad mood.  Changes in sleep patterns.  Changes in appetite or eating patterns.  Feeling an overall lack of motivation or enjoyment of activities that you previously enjoyed.  Frequent crying spells.  Talk with your health care provider if you think that you are experiencing depression. What should I know about immunizations? It is important that you get and maintain your immunizations. These include:  Tetanus, diphtheria, and pertussis (Tdap) booster vaccine.  Influenza every year before the flu season begins.  Pneumonia vaccine.  Shingles vaccine.  Your health care provider may also recommend other immunizations. This information is not intended to replace advice given to you by your health care provider. Make sure you discuss any questions you have with your health care provider. Document Released: 08/23/2005 Document Revised: 01/19/2016 Document Reviewed: 04/04/2015 Elsevier Interactive Patient Education  2018 [employer].

## 2017-07-28 DIAGNOSIS — H26493 Other secondary cataract, bilateral: Secondary | ICD-10-CM | POA: Diagnosis not present

## 2017-08-13 DIAGNOSIS — M9904 Segmental and somatic dysfunction of sacral region: Secondary | ICD-10-CM | POA: Diagnosis not present

## 2017-08-13 DIAGNOSIS — M545 Low back pain: Secondary | ICD-10-CM | POA: Diagnosis not present

## 2017-08-13 DIAGNOSIS — M9901 Segmental and somatic dysfunction of cervical region: Secondary | ICD-10-CM | POA: Diagnosis not present

## 2017-08-13 DIAGNOSIS — M9903 Segmental and somatic dysfunction of lumbar region: Secondary | ICD-10-CM | POA: Diagnosis not present

## 2017-08-19 ENCOUNTER — Other Ambulatory Visit: Payer: Medicare Other

## 2017-08-20 ENCOUNTER — Other Ambulatory Visit (INDEPENDENT_AMBULATORY_CARE_PROVIDER_SITE_OTHER): Payer: Medicare Other

## 2017-08-20 DIAGNOSIS — R946 Abnormal results of thyroid function studies: Secondary | ICD-10-CM

## 2017-08-20 LAB — TSH: TSH: 4.17 u[IU]/mL (ref 0.35–4.50)

## 2017-08-20 LAB — T4, FREE: FREE T4: 0.75 ng/dL (ref 0.60–1.60)

## 2017-08-20 NOTE — Addendum Note (Signed)
Addended by: Harl Bowie on: 08/20/2017 02:59 PM   Modules accepted: Orders

## 2017-09-10 DIAGNOSIS — M9904 Segmental and somatic dysfunction of sacral region: Secondary | ICD-10-CM | POA: Diagnosis not present

## 2017-09-10 DIAGNOSIS — M9903 Segmental and somatic dysfunction of lumbar region: Secondary | ICD-10-CM | POA: Diagnosis not present

## 2017-09-10 DIAGNOSIS — M545 Low back pain: Secondary | ICD-10-CM | POA: Diagnosis not present

## 2017-09-10 DIAGNOSIS — M9901 Segmental and somatic dysfunction of cervical region: Secondary | ICD-10-CM | POA: Diagnosis not present

## 2017-09-11 ENCOUNTER — Encounter: Payer: Self-pay | Admitting: Family Medicine

## 2017-09-22 ENCOUNTER — Other Ambulatory Visit: Payer: Self-pay | Admitting: Family Medicine

## 2017-10-08 DIAGNOSIS — M9901 Segmental and somatic dysfunction of cervical region: Secondary | ICD-10-CM | POA: Diagnosis not present

## 2017-10-08 DIAGNOSIS — M9903 Segmental and somatic dysfunction of lumbar region: Secondary | ICD-10-CM | POA: Diagnosis not present

## 2017-10-08 DIAGNOSIS — M545 Low back pain: Secondary | ICD-10-CM | POA: Diagnosis not present

## 2017-10-08 DIAGNOSIS — M9904 Segmental and somatic dysfunction of sacral region: Secondary | ICD-10-CM | POA: Diagnosis not present

## 2017-10-14 DIAGNOSIS — M542 Cervicalgia: Secondary | ICD-10-CM | POA: Diagnosis not present

## 2017-10-14 DIAGNOSIS — M9901 Segmental and somatic dysfunction of cervical region: Secondary | ICD-10-CM | POA: Diagnosis not present

## 2017-10-14 DIAGNOSIS — M47812 Spondylosis without myelopathy or radiculopathy, cervical region: Secondary | ICD-10-CM | POA: Diagnosis not present

## 2017-10-14 DIAGNOSIS — M62830 Muscle spasm of back: Secondary | ICD-10-CM | POA: Diagnosis not present

## 2017-10-20 DIAGNOSIS — M542 Cervicalgia: Secondary | ICD-10-CM | POA: Diagnosis not present

## 2017-10-20 DIAGNOSIS — M47812 Spondylosis without myelopathy or radiculopathy, cervical region: Secondary | ICD-10-CM | POA: Diagnosis not present

## 2017-10-20 DIAGNOSIS — M9901 Segmental and somatic dysfunction of cervical region: Secondary | ICD-10-CM | POA: Diagnosis not present

## 2017-10-20 DIAGNOSIS — M62830 Muscle spasm of back: Secondary | ICD-10-CM | POA: Diagnosis not present

## 2017-10-26 ENCOUNTER — Other Ambulatory Visit: Payer: Self-pay | Admitting: Family Medicine

## 2017-10-30 DIAGNOSIS — M62830 Muscle spasm of back: Secondary | ICD-10-CM | POA: Diagnosis not present

## 2017-10-30 DIAGNOSIS — M47812 Spondylosis without myelopathy or radiculopathy, cervical region: Secondary | ICD-10-CM | POA: Diagnosis not present

## 2017-10-30 DIAGNOSIS — M9901 Segmental and somatic dysfunction of cervical region: Secondary | ICD-10-CM | POA: Diagnosis not present

## 2017-10-30 DIAGNOSIS — M542 Cervicalgia: Secondary | ICD-10-CM | POA: Diagnosis not present

## 2017-11-03 DIAGNOSIS — M542 Cervicalgia: Secondary | ICD-10-CM | POA: Diagnosis not present

## 2017-11-03 DIAGNOSIS — M47812 Spondylosis without myelopathy or radiculopathy, cervical region: Secondary | ICD-10-CM | POA: Diagnosis not present

## 2017-11-03 DIAGNOSIS — M62830 Muscle spasm of back: Secondary | ICD-10-CM | POA: Diagnosis not present

## 2017-11-03 DIAGNOSIS — M9901 Segmental and somatic dysfunction of cervical region: Secondary | ICD-10-CM | POA: Diagnosis not present

## 2017-11-10 DIAGNOSIS — M62830 Muscle spasm of back: Secondary | ICD-10-CM | POA: Diagnosis not present

## 2017-11-10 DIAGNOSIS — M542 Cervicalgia: Secondary | ICD-10-CM | POA: Diagnosis not present

## 2017-11-10 DIAGNOSIS — M9901 Segmental and somatic dysfunction of cervical region: Secondary | ICD-10-CM | POA: Diagnosis not present

## 2017-11-10 DIAGNOSIS — M47812 Spondylosis without myelopathy or radiculopathy, cervical region: Secondary | ICD-10-CM | POA: Diagnosis not present

## 2017-11-17 DIAGNOSIS — M542 Cervicalgia: Secondary | ICD-10-CM | POA: Diagnosis not present

## 2017-11-17 DIAGNOSIS — M47812 Spondylosis without myelopathy or radiculopathy, cervical region: Secondary | ICD-10-CM | POA: Diagnosis not present

## 2017-11-17 DIAGNOSIS — M62830 Muscle spasm of back: Secondary | ICD-10-CM | POA: Diagnosis not present

## 2017-11-17 DIAGNOSIS — M9901 Segmental and somatic dysfunction of cervical region: Secondary | ICD-10-CM | POA: Diagnosis not present

## 2017-11-27 ENCOUNTER — Other Ambulatory Visit: Payer: Medicare Other

## 2017-12-02 ENCOUNTER — Other Ambulatory Visit (INDEPENDENT_AMBULATORY_CARE_PROVIDER_SITE_OTHER): Payer: Medicare Other

## 2017-12-02 DIAGNOSIS — R946 Abnormal results of thyroid function studies: Secondary | ICD-10-CM

## 2017-12-02 DIAGNOSIS — E782 Mixed hyperlipidemia: Secondary | ICD-10-CM

## 2017-12-02 DIAGNOSIS — I10 Essential (primary) hypertension: Secondary | ICD-10-CM

## 2017-12-02 DIAGNOSIS — E559 Vitamin D deficiency, unspecified: Secondary | ICD-10-CM

## 2017-12-02 LAB — LIPID PANEL
CHOL/HDL RATIO: 3
Cholesterol: 179 mg/dL (ref 0–200)
HDL: 70.6 mg/dL (ref >39.00)
LDL CALC: 85 mg/dL (ref 0–99)
NONHDL: 108.71
Triglycerides: 119 mg/dL (ref 0.0–149.0)
VLDL: 23.8 mg/dL (ref 0.0–40.0)

## 2017-12-02 LAB — COMPREHENSIVE METABOLIC PANEL
ALT: 16 U/L (ref 0–35)
AST: 19 U/L (ref 0–37)
Albumin: 4.2 g/dL (ref 3.5–5.2)
Alkaline Phosphatase: 68 U/L (ref 39–117)
BILIRUBIN TOTAL: 0.5 mg/dL (ref 0.2–1.2)
BUN: 16 mg/dL (ref 6–23)
CALCIUM: 9.1 mg/dL (ref 8.4–10.5)
CHLORIDE: 99 meq/L (ref 96–112)
CO2: 27 mEq/L (ref 19–32)
Creatinine, Ser: 0.75 mg/dL (ref 0.40–1.20)
GFR: 79.77 mL/min (ref >60.00)
Glucose, Bld: 105 mg/dL — ABNORMAL HIGH (ref 70–99)
POTASSIUM: 4.3 meq/L (ref 3.5–5.1)
Sodium: 136 mEq/L (ref 135–145)
Total Protein: 6.9 g/dL (ref 6.0–8.3)

## 2017-12-02 LAB — CBC
HCT: 40.8 % (ref 36.0–46.0)
HEMOGLOBIN: 13.7 g/dL (ref 12.0–15.0)
MCHC: 33.6 g/dL (ref 30.0–36.0)
MCV: 89.8 fl (ref 78.0–100.0)
PLATELETS: 224 10*3/uL (ref 150.0–400.0)
RBC: 4.54 Mil/uL (ref 3.87–5.11)
RDW: 14.2 % (ref 11.5–15.5)
WBC: 4.5 10*3/uL (ref 4.0–10.5)

## 2017-12-02 LAB — TSH: TSH: 3.84 u[IU]/mL (ref 0.35–4.50)

## 2017-12-02 LAB — VITAMIN D 25 HYDROXY (VIT D DEFICIENCY, FRACTURES): VITD: 40.79 ng/mL (ref 30.00–100.00)

## 2017-12-02 NOTE — Addendum Note (Signed)
Addended by: Caffie Pinto on: 12/02/2017 08:58 AM   Modules accepted: Orders

## 2017-12-03 DIAGNOSIS — M9906 Segmental and somatic dysfunction of lower extremity: Secondary | ICD-10-CM | POA: Diagnosis not present

## 2017-12-03 DIAGNOSIS — M47812 Spondylosis without myelopathy or radiculopathy, cervical region: Secondary | ICD-10-CM | POA: Diagnosis not present

## 2017-12-03 DIAGNOSIS — M62830 Muscle spasm of back: Secondary | ICD-10-CM | POA: Diagnosis not present

## 2017-12-03 DIAGNOSIS — M9901 Segmental and somatic dysfunction of cervical region: Secondary | ICD-10-CM | POA: Diagnosis not present

## 2017-12-04 ENCOUNTER — Encounter: Payer: Self-pay | Admitting: Family Medicine

## 2017-12-04 ENCOUNTER — Ambulatory Visit (INDEPENDENT_AMBULATORY_CARE_PROVIDER_SITE_OTHER): Payer: Medicare Other | Admitting: Family Medicine

## 2017-12-04 VITALS — BP 124/68 | HR 57 | Temp 98.0°F | Resp 18 | Ht 60.0 in | Wt 140.0 lb

## 2017-12-04 DIAGNOSIS — W19XXXA Unspecified fall, initial encounter: Secondary | ICD-10-CM | POA: Insufficient documentation

## 2017-12-04 DIAGNOSIS — R739 Hyperglycemia, unspecified: Secondary | ICD-10-CM | POA: Diagnosis not present

## 2017-12-04 DIAGNOSIS — K635 Polyp of colon: Secondary | ICD-10-CM

## 2017-12-04 DIAGNOSIS — K219 Gastro-esophageal reflux disease without esophagitis: Secondary | ICD-10-CM

## 2017-12-04 DIAGNOSIS — E559 Vitamin D deficiency, unspecified: Secondary | ICD-10-CM | POA: Diagnosis not present

## 2017-12-04 DIAGNOSIS — I10 Essential (primary) hypertension: Secondary | ICD-10-CM

## 2017-12-04 DIAGNOSIS — E782 Mixed hyperlipidemia: Secondary | ICD-10-CM | POA: Diagnosis not present

## 2017-12-04 DIAGNOSIS — N952 Postmenopausal atrophic vaginitis: Secondary | ICD-10-CM | POA: Diagnosis not present

## 2017-12-04 DIAGNOSIS — W19XXXD Unspecified fall, subsequent encounter: Secondary | ICD-10-CM | POA: Diagnosis not present

## 2017-12-04 DIAGNOSIS — M818 Other osteoporosis without current pathological fracture: Secondary | ICD-10-CM | POA: Diagnosis not present

## 2017-12-04 DIAGNOSIS — Z0000 Encounter for general adult medical examination without abnormal findings: Secondary | ICD-10-CM

## 2017-12-04 NOTE — Assessment & Plan Note (Signed)
Encouraged to get adequate exercise, calcium and vitamin d intake 

## 2017-12-04 NOTE — Assessment & Plan Note (Signed)
Well controlled, no changes to meds. Encouraged heart healthy diet such as the DASH diet and exercise as tolerated.  °

## 2017-12-04 NOTE — Assessment & Plan Note (Addendum)
Take a daily supplement, takes a total of 3000 IU daily

## 2017-12-04 NOTE — Assessment & Plan Note (Signed)
No trouble with symptoms and not sexually so she has stopped her Premarin in January so will stay off of it.

## 2017-12-04 NOTE — Assessment & Plan Note (Signed)
Left arm continues to hurt but is improving resolves with Tylenol prn

## 2017-12-04 NOTE — Patient Instructions (Addendum)
Tylenol/Acetaminophen ES 500 mg tabs, 1-2 tabs up to three x a day (max of 3000 mg in 24 hours) Preventive Care 77 Years and Older, Female Preventive care refers to lifestyle choices and visits with your health care provider that can promote health and wellness. What does preventive care include?  A yearly physical exam. This is also called an annual well check.  Dental exams once or twice a year.  Routine eye exams. Ask your health care provider how often you should have your eyes checked.  Personal lifestyle choices, including: ? Daily care of your teeth and gums. ? Regular physical activity. ? Eating a healthy diet. ? Avoiding tobacco and drug use. ? Limiting alcohol use. ? Practicing safe sex. ? Taking low-dose aspirin every day. ? Taking vitamin and mineral supplements as recommended by your health care provider. What happens during an annual well check? The services and screenings done by your health care provider during your annual well check will depend on your age, overall health, lifestyle risk factors, and family history of disease. Counseling Your health care provider may ask you questions about your:  Alcohol use.  Tobacco use.  Drug use.  Emotional well-being.  Home and relationship well-being.  Sexual activity.  Eating habits.  History of falls.  Memory and ability to understand (cognition).  Work and work Statistician.  Reproductive health.  Screening You may have the following tests or measurements:  Height, weight, and BMI.  Blood pressure.  Lipid and cholesterol levels. These may be checked every 5 years, or more frequently if you are over 21 years old.  Skin check.  Lung cancer screening. You may have this screening every year starting at age 77 if you have a 30-pack-year history of smoking and currently smoke or have quit within the past 15 years.  Fecal occult blood test (FOBT) of the stool. You may have this test every year starting at  age 77.  Flexible sigmoidoscopy or colonoscopy. You may have a sigmoidoscopy every 5 years or a colonoscopy every 10 years starting at age 46.  Hepatitis C blood test.  Hepatitis B blood test.  Sexually transmitted disease (STD) testing.  Diabetes screening. This is done by checking your blood sugar (glucose) after you have not eaten for a while (fasting). You may have this done every 1-3 years.  Bone density scan. This is done to screen for osteoporosis. You may have this done starting at age 77.  Mammogram. This may be done every 1-2 years. Talk to your health care provider about how often you should have regular mammograms.  Talk with your health care provider about your test results, treatment options, and if necessary, the need for more tests. Vaccines Your health care provider may recommend certain vaccines, such as:  Influenza vaccine. This is recommended every year.  Tetanus, diphtheria, and acellular pertussis (Tdap, Td) vaccine. You may need a Td booster every 10 years.  Varicella vaccine. You may need this if you have not been vaccinated.  Zoster vaccine. You may need this after age 77.  Measles, mumps, and rubella (MMR) vaccine. You may need at least one dose of MMR if you were born in 1957 or later. You may also need a second dose.  Pneumococcal 13-valent conjugate (PCV13) vaccine. One dose is recommended after age 77.  Pneumococcal polysaccharide (PPSV23) vaccine. One dose is recommended after age 77.  Meningococcal vaccine. You may need this if you have certain conditions.  Hepatitis A vaccine. You may need this  if you have certain conditions or if you travel or work in places where you may be exposed to hepatitis A.  Hepatitis B vaccine. You may need this if you have certain conditions or if you travel or work in places where you may be exposed to hepatitis B.  Haemophilus influenzae type b (Hib) vaccine. You may need this if you have certain  conditions.  Talk to your health care provider about which screenings and vaccines you need and how often you need them. This information is not intended to replace advice given to you by your health care provider. Make sure you discuss any questions you have with your health care provider. Document Released: 07/28/2015 Document Revised: 03/20/2016 Document Reviewed: 05/02/2015 Elsevier Interactive Patient Education  2018 [place] 65 Years and Older, Female Preventive care refers to lifestyle choices and visits with your health care provider that can promote health and wellness. What does preventive care include?  A yearly physical exam. This is also called an annual well check.  Dental exams once or twice a year.  Routine eye exams. Ask your health care provider how often you should have your eyes checked.  Personal lifestyle choices, including: ? Daily care of your teeth and gums. ? Regular physical activity. ? Eating a healthy diet. ? Avoiding tobacco and drug use. ? Limiting alcohol use. ? Practicing safe sex. ? Taking low-dose aspirin every day. ? Taking vitamin and mineral supplements as recommended by your health care provider. What happens during an annual well check? The services and screenings done by your health care provider during your annual well check will depend on your age, overall health, lifestyle risk factors, and family history of disease. Counseling Your health care provider may ask you questions about your:  Alcohol use.  Tobacco use.  Drug use.  Emotional well-being.  Home and relationship well-being.  Sexual activity.  Eating habits.  History of falls.  Memory and ability to understand (cognition).  Work and work Statistician.  Reproductive health.  Screening You may have the following tests or measurements:  Height, weight, and BMI.  Blood pressure.  Lipid and cholesterol levels. These may be checked every 5  years, or more frequently if you are over 38 years old.  Skin check.  Lung cancer screening. You may have this screening every year starting at age 27 if you have a 30-pack-year history of smoking and currently smoke or have quit within the past 15 years.  Fecal occult blood test (FOBT) of the stool. You may have this test every year starting at age 3.  Flexible sigmoidoscopy or colonoscopy. You may have a sigmoidoscopy every 5 years or a colonoscopy every 10 years starting at age 79.  Hepatitis C blood test.  Hepatitis B blood test.  Sexually transmitted disease (STD) testing.  Diabetes screening. This is done by checking your blood sugar (glucose) after you have not eaten for a while (fasting). You may have this done every 1-3 years.  Bone density scan. This is done to screen for osteoporosis. You may have this done starting at age 40.  Mammogram. This may be done every 1-2 years. Talk to your health care provider about how often you should have regular mammograms.  Talk with your health care provider about your test results, treatment options, and if necessary, the need for more tests. Vaccines Your health care provider may recommend certain vaccines, such as:  Influenza vaccine. This is recommended every year.  Tetanus, diphtheria,  and acellular pertussis (Tdap, Td) vaccine. You may need a Td booster every 10 years.  Varicella vaccine. You may need this if you have not been vaccinated.  Zoster vaccine. You may need this after age 2.  Measles, mumps, and rubella (MMR) vaccine. You may need at least one dose of MMR if you were born in 1957 or later. You may also need a second dose.  Pneumococcal 13-valent conjugate (PCV13) vaccine. One dose is recommended after age 8.  Pneumococcal polysaccharide (PPSV23) vaccine. One dose is recommended after age 20.  Meningococcal vaccine. You may need this if you have certain conditions.  Hepatitis A vaccine. You may need this if you  have certain conditions or if you travel or work in places where you may be exposed to hepatitis A.  Hepatitis B vaccine. You may need this if you have certain conditions or if you travel or work in places where you may be exposed to hepatitis B.  Haemophilus influenzae type b (Hib) vaccine. You may need this if you have certain conditions.  Talk to your health care provider about which screenings and vaccines you need and how often you need them. This information is not intended to replace advice given to you by your health care provider. Make sure you discuss any questions you have with your health care provider. Document Released: 07/28/2015 Document Revised: 03/20/2016 Document Reviewed: 05/02/2015 Elsevier Interactive Patient Education  [employer].

## 2017-12-04 NOTE — Assessment & Plan Note (Signed)
Patient encouraged to maintain heart healthy diet, regular exercise, adequate sleep. Consider daily probiotics. Take medications as prescribed. Reviewed labs

## 2017-12-04 NOTE — Assessment & Plan Note (Signed)
minimize simple carbs. Increase exercise as tolerated.  

## 2017-12-04 NOTE — Assessment & Plan Note (Signed)
Doing well without PPI, uses uses Tums, Zantac prn infrequently with good results

## 2017-12-04 NOTE — Assessment & Plan Note (Signed)
Doing well without sympotms

## 2017-12-04 NOTE — Progress Notes (Signed)
Subjective:  I acted as a Education administrator for Dr. Charlett Blake. [place], [place]  Patient ID: Terri Sandoval, female    DOB: [birth year], 77 y.o.   MRN: [id number]  No chief complaint on file.   HPI  Patient is in today for an annual exam and follow up on chronic medical concerns including hyperlipidemia, vitamin D deficiency, reflux, hypertension and more. No recent febrile illness or hospitalizations. She has been under a great deal of stress with her husband's poor health but he is improving. She is managing activities of daily living well but she did suffer a fall in March. She denies syncope or significant injury. Her major pain was her left arm but that is slowly improving and no fracure was noted. She eats a heart healthy diet and stays active. Denies CP/palp/SOB/HA/congestion/fevers/GI or GU c/o. Taking meds as prescribed  Patient Care Team: Mosie Lukes, MD as PCP - General (Family Medicine) Murvin Donning, MD (Dermatology) Armbruster, Carlota Raspberry, MD as Consulting Physician (Gastroenterology) Bethann Goo, Sebastian as Referring Physician (Chiropractic Medicine) Linward Natal, MD as Consulting Physician (Ophthalmology)   Past Medical History:  Diagnosis Date  . Adrenal insufficiency ([place])   . Anxiety   . Arthritis   . Atrophic vaginitis 11/08/2013  . BPV (benign positional vertigo) 05/08/2015  . Chicken pox as a child  . Colon polyps    removed during colonscopy  . Diverticulitis   . Diverticulosis   . GERD (gastroesophageal reflux disease)   . Hyperlipidemia   . Hypertension   . Medicare annual wellness visit, subsequent 05/16/2013  . Osteoporosis, unspecified 03/09/2013  . Preventative health care 11/07/2015  . Skin cancer    behind ear  . Urine incontinence    sometimes  . Verruca 11/07/2015  . Vitamin D deficiency 11/07/2015    Past Surgical History:  Procedure Laterality Date  . ABDOMINAL HYSTERECTOMY  97   has ovaries  . BREAST BIOPSY Left     FNA  . CATARACT EXTRACTION  L  09/11/10, R 10/10/10  . [place]  . EYE SURGERY Bilateral 10/10/2010, 07/28/17   cataracts, posterior capsulotomy b/l  . TONSILLECTOMY AND ADENOIDECTOMY  1990    Family History  Problem Relation Age of Onset  . Arthritis Mother   . Hyperlipidemia Mother   . Heart disease Mother   . Hypertension Mother   . Colon cancer Father   . Diabetes Sister        type 2  . Heart disease Sister   . Hypertension Sister   . Hyperlipidemia Sister   . Mental illness Maternal Uncle   . Stroke Maternal Grandmother   . Pneumonia Maternal Grandfather   . Cerebral palsy Son        MR  . Colon cancer Cousin     Social History   Socioeconomic History  . Marital status: Married    Spouse name: Not on file  . Number of children: 2  . Years of education: Not on file  . Highest education level: Not on file  Occupational History  . Occupation: retired  Scientific laboratory technician  . Financial resource strain: Not on file  . Food insecurity:    Worry: Not on file    Inability: Not on file  . Transportation needs:    Medical: Not on file    Non-medical: Not on file  Tobacco Use  . Smoking status: Never Smoker  . Smokeless tobacco: Never Used  Substance  and Sexual Activity  . Alcohol use: No  . Drug use: No  . Sexual activity: Never    Comment: lives with husband and disabled son, no dietary restrictions, not exercising regularly  Lifestyle  . Physical activity:    Days per week: Not on file    Minutes per session: Not on file  . Stress: Not on file  Relationships  . Social connections:    Talks on phone: Not on file    Gets together: Not on file    Attends religious service: Not on file    Active member of club or organization: Not on file    Attends meetings of clubs or organizations: Not on file    Relationship status: Not on file  . Intimate partner violence:    Fear of current or ex partner: Not on file    Emotionally abused: Not on file    Physically  abused: Not on file    Forced sexual activity: Not on file  Other Topics Concern  . Not on file  Social History Narrative  . Not on file    Outpatient Medications Prior to Visit  Medication Sig Dispense Refill  . Cholecalciferol (VITAMIN D3) 1000 units CAPS Take by mouth.    . fish oil-omega-3 fatty acids 1000 MG capsule Take 1,200 mg by mouth daily.    Marland Kitchen FLUoxetine (PROZAC) 20 MG capsule TAKE 1 CAPSULE BY MOUTH EVERY DAY 90 capsule 0  . glucosamine-chondroitin 500-400 MG tablet Take 1 tablet by mouth 2 (two) times daily.     . Multiple Vitamin (MULTIVITAMIN) tablet Take 1 tablet by mouth daily.    . mupirocin ointment (BACTROBAN) 2 % Apply 1 application daily as needed topically.  22 g 0  . neomycin-polymyxin-hydrocortisone (CORTISPORIN) otic solution Place 3 drops into both ears at bedtime. For 5 days prior to having flushing (Patient taking differently: Place 3 drops into both ears at bedtime as needed. For 5 days prior to having flushing) 10 mL 1  . Probiotic Product (PROBIOTIC-10 PO) Take 1 capsule by mouth daily.    . ramipril (ALTACE) 10 MG capsule Take 1 capsule (10 mg total) by mouth daily. 90 capsule 0  . simvastatin (ZOCOR) 20 MG tablet TAKE 1 TABLET BY MOUTH DAILY AT 6 PM 90 tablet 0  . conjugated estrogens (PREMARIN) vaginal cream Place vaginally daily. X 1 week then drop to twice weekly (Patient not taking: Reported on 12/04/2017) 42.5 g 2   No facility-administered medications prior to visit.     Allergies  Allergen Reactions  . Sulfamethoxazole-Trimethoprim Other (See Comments)    Very sore, sore in corner of lip    Review of Systems  Constitutional: Negative for chills, fever and malaise/fatigue.  HENT: Negative for congestion and hearing loss.   Eyes: Negative for discharge.  Respiratory: Negative for cough, sputum production and shortness of breath.   Cardiovascular: Negative for chest pain, palpitations and leg swelling.  Gastrointestinal: Negative for  abdominal pain, blood in stool, constipation, diarrhea, heartburn, nausea and vomiting.  Genitourinary: Negative for dysuria, frequency, hematuria and urgency.  Musculoskeletal: Positive for falls and joint pain. Negative for back pain and myalgias.  Skin: Negative for rash.  Neurological: Negative for dizziness, sensory change, loss of consciousness, weakness and headaches.  Endo/Heme/Allergies: Negative for environmental allergies. Does not bruise/bleed easily.  Psychiatric/Behavioral: Negative for depression and suicidal ideas. The patient is not nervous/anxious and does not have insomnia.        Objective:  Physical Exam  Constitutional: She is oriented to person, place, and time. No distress.  HENT:  Head: Normocephalic and atraumatic.  Right Ear: External ear normal.  Left Ear: External ear normal.  Nose: Nose normal.  Mouth/Throat: Oropharynx is clear and moist. No oropharyngeal exudate.  Eyes: Pupils are equal, round, and reactive to light. Conjunctivae are normal. Right eye exhibits no discharge. Left eye exhibits no discharge. No scleral icterus.  Neck: Normal range of motion. Neck supple. No thyromegaly present.  Cardiovascular: Normal rate, regular rhythm, normal heart sounds and intact distal pulses.  No murmur heard. Pulmonary/Chest: Effort normal and breath sounds normal. No respiratory distress. She has no wheezes. She has no rales.  Abdominal: Soft. Bowel sounds are normal. She exhibits no distension and no mass. There is no tenderness.  Musculoskeletal: Normal range of motion. She exhibits no edema or tenderness.  Lymphadenopathy:    She has no cervical adenopathy.  Neurological: She is alert and oriented to person, place, and time. She has normal reflexes. She displays normal reflexes. No cranial nerve deficit. Coordination normal.  Skin: Skin is warm and dry. No rash noted. She is not diaphoretic.    BP 124/68 (BP Location: Left Arm, Patient Position: Sitting,  Cuff Size: Normal)   Pulse (!) 57   Temp 98 F (36.7 C) (Oral)   Resp 18   Ht 5' (1.524 m)   Wt 140 lb (63.5 kg)   SpO2 98%   BMI 27.34 kg/m  Wt Readings from Last 3 Encounters:  12/04/17 140 lb (63.5 kg)  07/25/17 138 lb (62.6 kg)  05/22/17 139 lb 12.8 oz (63.4 kg)   BP Readings from Last 3 Encounters:  12/04/17 124/68  07/25/17 130/68  05/22/17 112/64     Immunization History  Administered Date(s) Administered  . Influenza, High Dose Seasonal PF 05/09/2016, 05/22/2017  . Influenza,inj,Quad PF,6+ Mos 04/13/2013, 04/20/2014, 05/08/2015  . Pneumococcal Conjugate-13 11/12/2013  . Pneumococcal Polysaccharide-23 04/25/2011  . Tdap 01/12/2010  . Zoster 07/16/2011  . Zoster Recombinat (Shingrix) 06/10/2017, 08/08/2017    Health Maintenance  Topic Date Due  . INFLUENZA VACCINE  02/12/2018  . TETANUS/TDAP  01/13/2020  . DEXA SCAN  Completed  . PNA vac Low Risk Adult  Completed    Lab Results  Component Value Date   WBC 4.5 12/02/2017   HGB 13.7 12/02/2017   HCT 40.8 12/02/2017   PLT 224.0 12/02/2017   GLUCOSE 105 (H) 12/02/2017   CHOL 179 12/02/2017   TRIG 119.0 12/02/2017   HDL 70.60 12/02/2017   LDLCALC 85 12/02/2017   ALT 16 12/02/2017   AST 19 12/02/2017   NA 136 12/02/2017   K 4.3 12/02/2017   CL 99 12/02/2017   CREATININE 0.75 12/02/2017   BUN 16 12/02/2017   CO2 27 12/02/2017   TSH 3.84 12/02/2017    Lab Results  Component Value Date   TSH 3.84 12/02/2017   Lab Results  Component Value Date   WBC 4.5 12/02/2017   HGB 13.7 12/02/2017   HCT 40.8 12/02/2017   MCV 89.8 12/02/2017   PLT 224.0 12/02/2017   Lab Results  Component Value Date   NA 136 12/02/2017   K 4.3 12/02/2017   CO2 27 12/02/2017   GLUCOSE 105 (H) 12/02/2017   BUN 16 12/02/2017   CREATININE 0.75 12/02/2017   BILITOT 0.5 12/02/2017   ALKPHOS 68 12/02/2017   AST 19 12/02/2017   ALT 16 12/02/2017   PROT 6.9 12/02/2017   ALBUMIN 4.2  12/02/2017   CALCIUM 9.1 12/02/2017    GFR 79.77 12/02/2017   Lab Results  Component Value Date   CHOL 179 12/02/2017   Lab Results  Component Value Date   HDL 70.60 12/02/2017   Lab Results  Component Value Date   LDLCALC 85 12/02/2017   Lab Results  Component Value Date   TRIG 119.0 12/02/2017   Lab Results  Component Value Date   CHOLHDL 3 12/02/2017   No results found for: HGBA1C       Assessment & Plan:   Problem List Items Addressed This Visit    GERD (gastroesophageal reflux disease)    Doing well without PPI, uses uses Tums, Zantac prn infrequently with good results      Hypertension    Well controlled, no changes to meds. Encouraged heart healthy diet such as the DASH diet and exercise as tolerated.       Relevant Orders   CBC   Comprehensive metabolic panel   Microalbumin / creatinine urine ratio   TSH   Hyperlipidemia    Encouraged heart healthy diet, increase exercise, avoid trans fats, consider a krill oil cap daily      Relevant Orders   Lipid panel   Colon polyps    Doing well without sympotms      Osteoporosis    Encouraged to get adequate exercise, calcium and vitamin d intake      Atrophic vaginitis    No trouble with symptoms and not sexually so she has stopped her Premarin in January so will stay off of it.       Vitamin D deficiency    Take a daily supplement, takes a total of 3000 IU daily      Relevant Orders   VITAMIN D 25 Hydroxy (Vit-D Deficiency, Fractures)   Preventative health care    Patient encouraged to maintain heart healthy diet, regular exercise, adequate sleep. Consider daily probiotics. Take medications as prescribed. Reviewed labs      Fall    Left arm continues to hurt but is improving resolves with Tylenol prn      Hyperglycemia     minimize simple carbs. Increase exercise as tolerated.      Relevant Orders   Hemoglobin A1c      I am having Dianelly E. Minney maintain her glucosamine-chondroitin, fish oil-omega-3 fatty acids,  multivitamin, neomycin-polymyxin-hydrocortisone, conjugated estrogens, Probiotic Product (PROBIOTIC-10 PO), Vitamin D3, mupirocin ointment, FLUoxetine, simvastatin, and ramipril.  No orders of the defined types were placed in this encounter.   CMA served as Education administrator during this visit. History, Physical and Plan performed by medical provider. Documentation and orders reviewed and attested to.  Penni Homans, MD

## 2017-12-04 NOTE — Assessment & Plan Note (Signed)
Encouraged heart healthy diet, increase exercise, avoid trans fats, consider a krill oil cap daily 

## 2017-12-11 DIAGNOSIS — M9901 Segmental and somatic dysfunction of cervical region: Secondary | ICD-10-CM | POA: Diagnosis not present

## 2017-12-11 DIAGNOSIS — M62830 Muscle spasm of back: Secondary | ICD-10-CM | POA: Diagnosis not present

## 2017-12-11 DIAGNOSIS — M9906 Segmental and somatic dysfunction of lower extremity: Secondary | ICD-10-CM | POA: Diagnosis not present

## 2017-12-11 DIAGNOSIS — M47812 Spondylosis without myelopathy or radiculopathy, cervical region: Secondary | ICD-10-CM | POA: Diagnosis not present

## 2017-12-14 ENCOUNTER — Other Ambulatory Visit: Payer: Self-pay | Admitting: Family Medicine

## 2017-12-17 DIAGNOSIS — M62830 Muscle spasm of back: Secondary | ICD-10-CM | POA: Diagnosis not present

## 2017-12-17 DIAGNOSIS — M47812 Spondylosis without myelopathy or radiculopathy, cervical region: Secondary | ICD-10-CM | POA: Diagnosis not present

## 2017-12-17 DIAGNOSIS — M9901 Segmental and somatic dysfunction of cervical region: Secondary | ICD-10-CM | POA: Diagnosis not present

## 2017-12-17 DIAGNOSIS — M9906 Segmental and somatic dysfunction of lower extremity: Secondary | ICD-10-CM | POA: Diagnosis not present

## 2017-12-31 DIAGNOSIS — L821 Other seborrheic keratosis: Secondary | ICD-10-CM | POA: Diagnosis not present

## 2017-12-31 DIAGNOSIS — Z85828 Personal history of other malignant neoplasm of skin: Secondary | ICD-10-CM | POA: Diagnosis not present

## 2017-12-31 DIAGNOSIS — L718 Other rosacea: Secondary | ICD-10-CM | POA: Diagnosis not present

## 2017-12-31 DIAGNOSIS — Z08 Encounter for follow-up examination after completed treatment for malignant neoplasm: Secondary | ICD-10-CM | POA: Diagnosis not present

## 2018-01-01 DIAGNOSIS — M9901 Segmental and somatic dysfunction of cervical region: Secondary | ICD-10-CM | POA: Diagnosis not present

## 2018-01-01 DIAGNOSIS — M9906 Segmental and somatic dysfunction of lower extremity: Secondary | ICD-10-CM | POA: Diagnosis not present

## 2018-01-01 DIAGNOSIS — M62830 Muscle spasm of back: Secondary | ICD-10-CM | POA: Diagnosis not present

## 2018-01-01 DIAGNOSIS — M47812 Spondylosis without myelopathy or radiculopathy, cervical region: Secondary | ICD-10-CM | POA: Diagnosis not present

## 2018-01-19 DIAGNOSIS — M9901 Segmental and somatic dysfunction of cervical region: Secondary | ICD-10-CM | POA: Diagnosis not present

## 2018-01-19 DIAGNOSIS — M62830 Muscle spasm of back: Secondary | ICD-10-CM | POA: Diagnosis not present

## 2018-01-19 DIAGNOSIS — M9906 Segmental and somatic dysfunction of lower extremity: Secondary | ICD-10-CM | POA: Diagnosis not present

## 2018-01-19 DIAGNOSIS — M47812 Spondylosis without myelopathy or radiculopathy, cervical region: Secondary | ICD-10-CM | POA: Diagnosis not present

## 2018-01-29 ENCOUNTER — Other Ambulatory Visit: Payer: Self-pay | Admitting: Family Medicine

## 2018-01-29 ENCOUNTER — Encounter: Payer: Self-pay | Admitting: Family Medicine

## 2018-02-02 DIAGNOSIS — M47812 Spondylosis without myelopathy or radiculopathy, cervical region: Secondary | ICD-10-CM | POA: Diagnosis not present

## 2018-02-02 DIAGNOSIS — M9901 Segmental and somatic dysfunction of cervical region: Secondary | ICD-10-CM | POA: Diagnosis not present

## 2018-02-02 DIAGNOSIS — M62830 Muscle spasm of back: Secondary | ICD-10-CM | POA: Diagnosis not present

## 2018-02-02 DIAGNOSIS — M9906 Segmental and somatic dysfunction of lower extremity: Secondary | ICD-10-CM | POA: Diagnosis not present

## 2018-02-02 MED ORDER — RAMIPRIL 10 MG PO CAPS: ORAL_CAPSULE | ORAL | 3 refills | Status: DC

## 2018-02-23 DIAGNOSIS — M9901 Segmental and somatic dysfunction of cervical region: Secondary | ICD-10-CM | POA: Diagnosis not present

## 2018-02-23 DIAGNOSIS — M47812 Spondylosis without myelopathy or radiculopathy, cervical region: Secondary | ICD-10-CM | POA: Diagnosis not present

## 2018-02-23 DIAGNOSIS — M62830 Muscle spasm of back: Secondary | ICD-10-CM | POA: Diagnosis not present

## 2018-02-23 DIAGNOSIS — M9906 Segmental and somatic dysfunction of lower extremity: Secondary | ICD-10-CM | POA: Diagnosis not present

## 2018-03-09 DIAGNOSIS — M9901 Segmental and somatic dysfunction of cervical region: Secondary | ICD-10-CM | POA: Diagnosis not present

## 2018-03-09 DIAGNOSIS — M62830 Muscle spasm of back: Secondary | ICD-10-CM | POA: Diagnosis not present

## 2018-03-09 DIAGNOSIS — M47812 Spondylosis without myelopathy or radiculopathy, cervical region: Secondary | ICD-10-CM | POA: Diagnosis not present

## 2018-03-09 DIAGNOSIS — M9906 Segmental and somatic dysfunction of lower extremity: Secondary | ICD-10-CM | POA: Diagnosis not present

## 2018-03-12 ENCOUNTER — Other Ambulatory Visit: Payer: Self-pay | Admitting: Family Medicine

## 2018-03-19 ENCOUNTER — Other Ambulatory Visit: Payer: Self-pay | Admitting: Family Medicine

## 2018-03-19 DIAGNOSIS — Z1231 Encounter for screening mammogram for malignant neoplasm of breast: Secondary | ICD-10-CM

## 2018-04-03 DIAGNOSIS — Z961 Presence of intraocular lens: Secondary | ICD-10-CM | POA: Diagnosis not present

## 2018-04-03 DIAGNOSIS — H35372 Puckering of macula, left eye: Secondary | ICD-10-CM | POA: Diagnosis not present

## 2018-04-03 DIAGNOSIS — H52203 Unspecified astigmatism, bilateral: Secondary | ICD-10-CM | POA: Diagnosis not present

## 2018-04-03 DIAGNOSIS — H0100B Unspecified blepharitis left eye, upper and lower eyelids: Secondary | ICD-10-CM | POA: Diagnosis not present

## 2018-04-03 DIAGNOSIS — H0100A Unspecified blepharitis right eye, upper and lower eyelids: Secondary | ICD-10-CM | POA: Diagnosis not present

## 2018-04-03 DIAGNOSIS — H43811 Vitreous degeneration, right eye: Secondary | ICD-10-CM | POA: Diagnosis not present

## 2018-04-03 DIAGNOSIS — H524 Presbyopia: Secondary | ICD-10-CM | POA: Diagnosis not present

## 2018-04-03 DIAGNOSIS — H43393 Other vitreous opacities, bilateral: Secondary | ICD-10-CM | POA: Diagnosis not present

## 2018-04-03 DIAGNOSIS — H04123 Dry eye syndrome of bilateral lacrimal glands: Secondary | ICD-10-CM | POA: Diagnosis not present

## 2018-04-13 DIAGNOSIS — M9901 Segmental and somatic dysfunction of cervical region: Secondary | ICD-10-CM | POA: Diagnosis not present

## 2018-04-13 DIAGNOSIS — M62830 Muscle spasm of back: Secondary | ICD-10-CM | POA: Diagnosis not present

## 2018-04-13 DIAGNOSIS — M47812 Spondylosis without myelopathy or radiculopathy, cervical region: Secondary | ICD-10-CM | POA: Diagnosis not present

## 2018-04-13 DIAGNOSIS — M9906 Segmental and somatic dysfunction of lower extremity: Secondary | ICD-10-CM | POA: Diagnosis not present

## 2018-05-01 ENCOUNTER — Ambulatory Visit (HOSPITAL_BASED_OUTPATIENT_CLINIC_OR_DEPARTMENT_OTHER)
Admission: RE | Admit: 2018-05-01 | Discharge: 2018-05-01 | Disposition: A | Discharge status: Home / Self Care | Payer: Medicare Other | Source: Ambulatory Visit | Attending: Family Medicine | Admitting: Family Medicine

## 2018-05-01 ENCOUNTER — Encounter (HOSPITAL_BASED_OUTPATIENT_CLINIC_OR_DEPARTMENT_OTHER): Payer: Self-pay

## 2018-05-01 DIAGNOSIS — Z1231 Encounter for screening mammogram for malignant neoplasm of breast: Secondary | ICD-10-CM | POA: Insufficient documentation

## 2018-05-02 ENCOUNTER — Other Ambulatory Visit: Payer: Self-pay | Admitting: Family Medicine

## 2018-05-04 ENCOUNTER — Other Ambulatory Visit (INDEPENDENT_AMBULATORY_CARE_PROVIDER_SITE_OTHER): Payer: Medicare Other

## 2018-05-04 DIAGNOSIS — E782 Mixed hyperlipidemia: Secondary | ICD-10-CM

## 2018-05-04 DIAGNOSIS — R739 Hyperglycemia, unspecified: Secondary | ICD-10-CM | POA: Diagnosis not present

## 2018-05-04 DIAGNOSIS — I10 Essential (primary) hypertension: Secondary | ICD-10-CM | POA: Diagnosis not present

## 2018-05-04 LAB — COMPREHENSIVE METABOLIC PANEL
ALBUMIN: 4.4 g/dL (ref 3.5–5.2)
ALK PHOS: 60 U/L (ref 39–117)
ALT: 18 U/L (ref 0–35)
AST: 20 U/L (ref 0–37)
BUN: 16 mg/dL (ref 6–23)
CALCIUM: 9.1 mg/dL (ref 8.4–10.5)
CO2: 31 mEq/L (ref 19–32)
Chloride: 99 mEq/L (ref 96–112)
Creatinine, Ser: 0.78 mg/dL (ref 0.40–1.20)
GFR: 76.15 mL/min (ref >60.00)
Glucose, Bld: 107 mg/dL — ABNORMAL HIGH (ref 70–99)
POTASSIUM: 4.2 meq/L (ref 3.5–5.1)
SODIUM: 137 meq/L (ref 135–145)
TOTAL PROTEIN: 6.9 g/dL (ref 6.0–8.3)
Total Bilirubin: 0.4 mg/dL (ref 0.2–1.2)

## 2018-05-04 LAB — LIPID PANEL
CHOL/HDL RATIO: 2
Cholesterol: 167 mg/dL (ref 0–200)
HDL: 74.3 mg/dL (ref >39.00)
LDL CALC: 77 mg/dL (ref 0–99)
NONHDL: 92.9
Triglycerides: 81 mg/dL (ref 0.0–149.0)
VLDL: 16.2 mg/dL (ref 0.0–40.0)

## 2018-05-04 LAB — CBC
HCT: 39.9 % (ref 36.0–46.0)
HEMOGLOBIN: 13.4 g/dL (ref 12.0–15.0)
MCHC: 33.5 g/dL (ref 30.0–36.0)
MCV: 89.4 fl (ref 78.0–100.0)
Platelets: 234 10*3/uL (ref 150.0–400.0)
RBC: 4.46 Mil/uL (ref 3.87–5.11)
RDW: 14.2 % (ref 11.5–15.5)
WBC: 4.9 10*3/uL (ref 4.0–10.5)

## 2018-05-04 LAB — MICROALBUMIN / CREATININE URINE RATIO
Creatinine,U: 45.3 mg/dL
Microalb Creat Ratio: 1.5 mg/g (ref 0.0–30.0)

## 2018-05-04 LAB — TSH: TSH: 3.9 u[IU]/mL (ref 0.35–4.50)

## 2018-05-04 LAB — HEMOGLOBIN A1C: HEMOGLOBIN A1C: 5.9 % (ref 4.6–6.5)

## 2018-05-07 ENCOUNTER — Ambulatory Visit (INDEPENDENT_AMBULATORY_CARE_PROVIDER_SITE_OTHER): Payer: Medicare Other | Admitting: Family Medicine

## 2018-05-07 ENCOUNTER — Encounter: Payer: Self-pay | Admitting: Family Medicine

## 2018-05-07 DIAGNOSIS — E559 Vitamin D deficiency, unspecified: Secondary | ICD-10-CM

## 2018-05-07 DIAGNOSIS — Z23 Encounter for immunization: Secondary | ICD-10-CM

## 2018-05-07 DIAGNOSIS — R739 Hyperglycemia, unspecified: Secondary | ICD-10-CM | POA: Diagnosis not present

## 2018-05-07 DIAGNOSIS — H6123 Impacted cerumen, bilateral: Secondary | ICD-10-CM | POA: Diagnosis not present

## 2018-05-07 DIAGNOSIS — I10 Essential (primary) hypertension: Secondary | ICD-10-CM

## 2018-05-07 DIAGNOSIS — E782 Mixed hyperlipidemia: Secondary | ICD-10-CM

## 2018-05-07 DIAGNOSIS — H8112 Benign paroxysmal vertigo, left ear: Secondary | ICD-10-CM

## 2018-05-07 NOTE — Assessment & Plan Note (Signed)
hgba1c acceptable, minimize simple carbs. Increase exercise as tolerated.  

## 2018-05-07 NOTE — Assessment & Plan Note (Signed)
Clear on exam today

## 2018-05-07 NOTE — Assessment & Plan Note (Signed)
Well controlled, no changes to meds. Encouraged heart healthy diet such as the DASH diet and exercise as tolerated.  °

## 2018-05-07 NOTE — Progress Notes (Signed)
Subjective:    Patient ID: Terri Sandoval, female    DOB: 1940-09-19, 77 y.o.   MRN: [id number]  No chief complaint on file.   HPI Patient is in today for follow up. She is doing well. No recent febrile illness or hospitalizations. No vertigo of note since she stopped drinking coffee and started drinking more water. Tries to maintain a heart healthy diet and stay active. Urinary incontinence has improved since giving up coffee also.. Denies CP/palp/SOB/HA/congestion/fevers/GI or GU c/o. Taking meds as prescribed  Past Medical History:  Diagnosis Date  . Adrenal insufficiency ([place])   . Anxiety   . Arthritis   . Atrophic vaginitis 11/08/2013  . BPV (benign positional vertigo) 05/08/2015  . Chicken pox as a child  . Colon polyps    removed during colonscopy  . Diverticulitis   . Diverticulosis   . GERD (gastroesophageal reflux disease)   . Hyperlipidemia   . Hypertension   . Medicare annual wellness visit, subsequent 05/16/2013  . Osteoporosis, unspecified 03/09/2013  . Preventative health care 11/07/2015  . Skin cancer    behind ear  . Urine incontinence    sometimes  . Verruca 11/07/2015  . Vitamin D deficiency 11/07/2015    Past Surgical History:  Procedure Laterality Date  . ABDOMINAL HYSTERECTOMY  97   has ovaries  . BREAST BIOPSY Left     FNA  . CATARACT EXTRACTION  L 09/11/10, R 10/10/10  . [place]  . EYE SURGERY Bilateral 10/10/2010, 07/28/17   cataracts, posterior capsulotomy b/l  . TONSILLECTOMY AND ADENOIDECTOMY  1990    Family History  Problem Relation Age of Onset  . Arthritis Mother   . Hyperlipidemia Mother   . Heart disease Mother   . Hypertension Mother   . Colon cancer Father   . Diabetes Sister        type 2  . Heart disease Sister   . Hypertension Sister   . Hyperlipidemia Sister   . Mental illness Maternal Uncle   . Stroke Maternal Grandmother   . Pneumonia Maternal Grandfather   . Cerebral palsy Son         MR  . Colon cancer Cousin     Social History   Socioeconomic History  . Marital status: Married    Spouse name: Not on file  . Number of children: 2  . Years of education: Not on file  . Highest education level: Not on file  Occupational History  . Occupation: retired  Scientific laboratory technician  . Financial resource strain: Not on file  . Food insecurity:    Worry: Not on file    Inability: Not on file  . Transportation needs:    Medical: Not on file    Non-medical: Not on file  Tobacco Use  . Smoking status: Never Smoker  . Smokeless tobacco: Never Used  Substance and Sexual Activity  . Alcohol use: No  . Drug use: No  . Sexual activity: Never    Comment: lives with husband and disabled son, no dietary restrictions, not exercising regularly  Lifestyle  . Physical activity:    Days per week: Not on file    Minutes per session: Not on file  . Stress: Not on file  Relationships  . Social connections:    Talks on phone: Not on file    Gets together: Not on file    Attends religious service: Not on file    Active  member of club or organization: Not on file    Attends meetings of clubs or organizations: Not on file    Relationship status: Not on file  . Intimate partner violence:    Fear of current or ex partner: Not on file    Emotionally abused: Not on file    Physically abused: Not on file    Forced sexual activity: Not on file  Other Topics Concern  . Not on file  Social History Narrative  . Not on file    Outpatient Medications Prior to Visit  Medication Sig Dispense Refill  . Cholecalciferol (VITAMIN D3) 1000 units CAPS Take by mouth.    . fish oil-omega-3 fatty acids 1000 MG capsule Take 1,200 mg by mouth daily.    Marland Kitchen FLUoxetine (PROZAC) 20 MG capsule TAKE 1 CAPSULE BY MOUTH EVERY DAY 90 capsule 0  . glucosamine-chondroitin 500-400 MG tablet Take 1 tablet by mouth 2 (two) times daily.     . Multiple Vitamin (MULTIVITAMIN) tablet Take 1 tablet by mouth daily.      . mupirocin ointment (BACTROBAN) 2 % Apply 1 application daily as needed topically.  22 g 0  . neomycin-polymyxin-hydrocortisone (CORTISPORIN) otic solution Place 3 drops into both ears at bedtime. For 5 days prior to having flushing (Patient taking differently: Place 3 drops into both ears at bedtime as needed. For 5 days prior to having flushing) 10 mL 1  . Probiotic Product (PROBIOTIC-10 PO) Take 1 capsule by mouth daily.    . ramipril (ALTACE) 10 MG capsule TAKE 1 CAPSULE(10 MG) BY MOUTH DAILY 90 capsule 0  . simvastatin (ZOCOR) 20 MG tablet TAKE 1 TABLET BY MOUTH DAILY AT 6 PM 90 tablet 0  . conjugated estrogens (PREMARIN) vaginal cream Place vaginally daily. X 1 week then drop to twice weekly (Patient not taking: Reported on 12/04/2017) 42.5 g 2  . ramipril (ALTACE) 10 MG capsule TAKE 1 CAPSULE(10 MG) BY MOUTH DAILY 90 capsule 3   No facility-administered medications prior to visit.     Allergies  Allergen Reactions  . Sulfamethoxazole-Trimethoprim Other (See Comments)    Very sore, sore in corner of lip    Review of Systems  Constitutional: Negative for fever and malaise/fatigue.  HENT: Negative for congestion.   Eyes: Negative for blurred vision.  Respiratory: Negative for cough and shortness of breath.   Cardiovascular: Negative for chest pain, palpitations and leg swelling.  Gastrointestinal: Negative for vomiting.  Musculoskeletal: Negative for back pain.  Skin: Negative for rash.  Neurological: Negative for loss of consciousness and headaches.       Objective:    Physical Exam  Constitutional: She is oriented to person, place, and time. She appears well-developed and well-nourished. No distress.  HENT:  Head: Normocephalic and atraumatic.  Nose: Nose normal.  Eyes: Right eye exhibits no discharge. Left eye exhibits no discharge.  Neck: Normal range of motion. Neck supple.  Cardiovascular: Normal rate and regular rhythm.  No murmur heard. Pulmonary/Chest: Effort  normal and breath sounds normal.  Abdominal: Soft. Bowel sounds are normal. There is no tenderness.  Musculoskeletal: She exhibits no edema.  Neurological: She is alert and oriented to person, place, and time.  Skin: Skin is warm and dry.  Psychiatric: She has a normal mood and affect.  Nursing note and vitals reviewed.   BP (!) 128/52 (BP Location: Left Arm, Patient Position: Sitting, Cuff Size: Normal)   Pulse 70   Temp 98.2 F (36.8 C) (Oral)   Resp  18   Ht 5' (1.524 m)   Wt 143 lb 12.8 oz (65.2 kg)   SpO2 95%   BMI 28.08 kg/m  Wt Readings from Last 3 Encounters:  05/07/18 143 lb 12.8 oz (65.2 kg)  12/04/17 140 lb (63.5 kg)  07/25/17 138 lb (62.6 kg)     Lab Results  Component Value Date   WBC 4.9 05/04/2018   HGB 13.4 05/04/2018   HCT 39.9 05/04/2018   PLT 234.0 05/04/2018   GLUCOSE 107 (H) 05/04/2018   CHOL 167 05/04/2018   TRIG 81.0 05/04/2018   HDL 74.30 05/04/2018   LDLCALC 77 05/04/2018   ALT 18 05/04/2018   AST 20 05/04/2018   NA 137 05/04/2018   K 4.2 05/04/2018   CL 99 05/04/2018   CREATININE 0.78 05/04/2018   BUN 16 05/04/2018   CO2 31 05/04/2018   TSH 3.90 05/04/2018   HGBA1C 5.9 05/04/2018   MICROALBUR <0.7 05/04/2018    Lab Results  Component Value Date   TSH 3.90 05/04/2018   Lab Results  Component Value Date   WBC 4.9 05/04/2018   HGB 13.4 05/04/2018   HCT 39.9 05/04/2018   MCV 89.4 05/04/2018   PLT 234.0 05/04/2018   Lab Results  Component Value Date   NA 137 05/04/2018   K 4.2 05/04/2018   CO2 31 05/04/2018   GLUCOSE 107 (H) 05/04/2018   BUN 16 05/04/2018   CREATININE 0.78 05/04/2018   BILITOT 0.4 05/04/2018   ALKPHOS 60 05/04/2018   AST 20 05/04/2018   ALT 18 05/04/2018   PROT 6.9 05/04/2018   ALBUMIN 4.4 05/04/2018   CALCIUM 9.1 05/04/2018   GFR 76.15 05/04/2018   Lab Results  Component Value Date   CHOL 167 05/04/2018   Lab Results  Component Value Date   HDL 74.30 05/04/2018   Lab Results  Component Value  Date   LDLCALC 77 05/04/2018   Lab Results  Component Value Date   TRIG 81.0 05/04/2018   Lab Results  Component Value Date   CHOLHDL 2 05/04/2018   Lab Results  Component Value Date   HGBA1C 5.9 05/04/2018       Assessment & Plan:   Problem List Items Addressed This Visit    Hypertension    Well controlled, no changes to meds. Encouraged heart healthy diet such as the DASH diet and exercise as tolerated.       Relevant Orders   CBC   Comprehensive metabolic panel   TSH   Hyperlipidemia    Encouraged heart healthy diet, increase exercise, avoid trans fats, consider a krill oil cap daily      Relevant Orders   Lipid panel   Cerumen impaction    Clear on exam today      BPV (benign positional vertigo)    Improved and less frequent episodes with improved hydration and quitting coffee      Vitamin D deficiency    supplement and monitor      Relevant Orders   VITAMIN D 25 Hydroxy (Vit-D Deficiency, Fractures)   Hyperglycemia    hgba1c acceptable, minimize simple carbs. Increase exercise as tolerated      Relevant Orders   Hemoglobin A1c      I have discontinued Esabella E. Devore's conjugated estrogens. I am also having her maintain her glucosamine-chondroitin, fish oil-omega-3 fatty acids, multivitamin, neomycin-polymyxin-hydrocortisone, Probiotic Product (PROBIOTIC-10 PO), Vitamin D3, mupirocin ointment, simvastatin, FLUoxetine, and ramipril.  No orders of the defined types were placed  in this encounter.    Penni Homans, MD

## 2018-05-07 NOTE — Assessment & Plan Note (Signed)
supplement and monitor 

## 2018-05-07 NOTE — Patient Instructions (Signed)

## 2018-05-07 NOTE — Assessment & Plan Note (Signed)
Improved and less frequent episodes with improved hydration and quitting coffee

## 2018-05-07 NOTE — Assessment & Plan Note (Signed)
Encouraged heart healthy diet, increase exercise, avoid trans fats, consider a krill oil cap daily 

## 2018-05-11 DIAGNOSIS — M62830 Muscle spasm of back: Secondary | ICD-10-CM | POA: Diagnosis not present

## 2018-05-11 DIAGNOSIS — M9901 Segmental and somatic dysfunction of cervical region: Secondary | ICD-10-CM | POA: Diagnosis not present

## 2018-05-11 DIAGNOSIS — M47812 Spondylosis without myelopathy or radiculopathy, cervical region: Secondary | ICD-10-CM | POA: Diagnosis not present

## 2018-05-11 DIAGNOSIS — M9906 Segmental and somatic dysfunction of lower extremity: Secondary | ICD-10-CM | POA: Diagnosis not present

## 2018-06-08 DIAGNOSIS — M62830 Muscle spasm of back: Secondary | ICD-10-CM | POA: Diagnosis not present

## 2018-06-08 DIAGNOSIS — M9901 Segmental and somatic dysfunction of cervical region: Secondary | ICD-10-CM | POA: Diagnosis not present

## 2018-06-08 DIAGNOSIS — M47812 Spondylosis without myelopathy or radiculopathy, cervical region: Secondary | ICD-10-CM | POA: Diagnosis not present

## 2018-06-08 DIAGNOSIS — M9906 Segmental and somatic dysfunction of lower extremity: Secondary | ICD-10-CM | POA: Diagnosis not present

## 2018-06-10 ENCOUNTER — Other Ambulatory Visit: Payer: Self-pay | Admitting: Family Medicine

## 2018-08-04 NOTE — Progress Notes (Deleted)
[place] [facility] [address] [place], [place] [address] [phone]  Date:  08/06/2018   Name:  Terri Sandoval   DOB:  [birth year]   MRN:  [id number]  PCP:  Mosie Lukes, MD    Chief Complaint: No chief complaint on file.   History of Present Illness:  Terri Sandoval is a 78 y.o. very pleasant female patient who presents with the following:  Patient of Dr. Charlett Blake whom I have not seen in the past History of hypertension, hyperlipidemia.  Here today for sick visit  Patient Active Problem List   Diagnosis Date Noted  . Fall 12/04/2017  . Hyperglycemia 12/04/2017  . Abnormal results of thyroid function studies 05/26/2017  . Vitamin D deficiency 11/07/2015  . Preventative health care 11/07/2015  . Verruca 11/07/2015  . BPV (benign positional vertigo) 05/08/2015  . Skin cancer 10/31/2014  . Atrophic vaginitis 11/08/2013  . Cerumen impaction 06/11/2013  . Medicare annual wellness visit, subsequent 05/16/2013  . Osteoporosis 03/09/2013  . GERD (gastroesophageal reflux disease)   . Hypertension   . Anxiety   . Hyperlipidemia   . Arthritis   . Chicken pox   . Diverticulosis   . Colon polyps   . Urine incontinence     Past Medical History:  Diagnosis Date  . Adrenal insufficiency ([place])   . Anxiety   . Arthritis   . Atrophic vaginitis 11/08/2013  . BPV (benign positional vertigo) 05/08/2015  . Chicken pox as a child  . Colon polyps    removed during colonscopy  . Diverticulitis   . Diverticulosis   . GERD (gastroesophageal reflux disease)   . Hyperlipidemia   . Hypertension   . Medicare annual wellness visit, subsequent 05/16/2013  . Osteoporosis, unspecified 03/09/2013  . Preventative health care 11/07/2015  . Skin cancer    behind ear  . Urine incontinence    sometimes  . Verruca 11/07/2015  . Vitamin D deficiency 11/07/2015    Past Surgical History:  Procedure Laterality Date  . ABDOMINAL  HYSTERECTOMY  97   has ovaries  . BREAST BIOPSY Left     FNA  . CATARACT EXTRACTION  L 09/11/10, R 10/10/10  . [place]  . EYE SURGERY Bilateral 10/10/2010, 07/28/17   cataracts, posterior capsulotomy b/l  . TONSILLECTOMY AND ADENOIDECTOMY  1990    Social History   Tobacco Use  . Smoking status: Never Smoker  . Smokeless tobacco: Never Used  Substance Use Topics  . Alcohol use: No  . Drug use: No    Family History  Problem Relation Age of Onset  . Arthritis Mother   . Hyperlipidemia Mother   . Heart disease Mother   . Hypertension Mother   . Colon cancer Father   . Diabetes Sister        type 2  . Heart disease Sister   . Hypertension Sister   . Hyperlipidemia Sister   . Mental illness Maternal Uncle   . Stroke Maternal Grandmother   . Pneumonia Maternal Grandfather   . Cerebral palsy Son        MR  . Colon cancer Cousin     Allergies  Allergen Reactions  . Sulfamethoxazole-Trimethoprim Other (See Comments)    Very sore, sore in corner of lip    Medication list has been reviewed and updated.  Current Outpatient Medications on File Prior to Visit  Medication Sig Dispense Refill  . Cholecalciferol (VITAMIN D3) 1000 units CAPS Take by mouth.    . fish oil-omega-3 fatty acids 1000 MG capsule Take 1,200 mg by mouth daily.    Marland Kitchen FLUoxetine (PROZAC) 20 MG capsule TAKE 1 CAPSULE BY MOUTH EVERY DAY 90 capsule 1  . glucosamine-chondroitin 500-400 MG tablet Take 1 tablet by mouth 2 (two) times daily.     . Multiple Vitamin (MULTIVITAMIN) tablet Take 1 tablet by mouth daily.    . mupirocin ointment (BACTROBAN) 2 % Apply 1 application daily as needed topically.  22 g 0  . neomycin-polymyxin-hydrocortisone (CORTISPORIN) otic solution Place 3 drops into both ears at bedtime. For 5 days prior to having flushing (Patient taking differently: Place 3 drops into both ears at bedtime as needed. For 5 days prior to having flushing) 10 mL 1  .  Probiotic Product (PROBIOTIC-10 PO) Take 1 capsule by mouth daily.    . ramipril (ALTACE) 10 MG capsule TAKE 1 CAPSULE(10 MG) BY MOUTH DAILY 90 capsule 0  . simvastatin (ZOCOR) 20 MG tablet TAKE 1 TABLET BY MOUTH DAILY AT 6 PM 90 tablet 1   No current facility-administered medications on file prior to visit.     Review of Systems:  As per HPI- otherwise negative.   Physical Examination: There were no vitals filed for this visit. There were no vitals filed for this visit. There is no height or weight on file to calculate BMI. Ideal Body Weight:    GEN: WDWN, NAD, Non-toxic, A & O x 3 HEENT: Atraumatic, Normocephalic. Neck supple. No masses, No LAD. Ears and Nose: No external deformity. CV: RRR, No M/G/R. No JVD. No thrill. No extra heart sounds. PULM: CTA B, no wheezes, crackles, rhonchi. No retractions. No resp. distress. No accessory muscle use. ABD: S, NT, ND, +BS. No rebound. No HSM. EXTR: No c/c/e NEURO Normal gait.  PSYCH: Normally interactive. Conversant. Not depressed or anxious appearing.  Calm demeanor.    Assessment and Plan: ***  Signed Lamar Blinks, MD

## 2018-08-05 ENCOUNTER — Encounter: Payer: Self-pay | Admitting: Family Medicine

## 2018-08-05 ENCOUNTER — Ambulatory Visit (INDEPENDENT_AMBULATORY_CARE_PROVIDER_SITE_OTHER): Payer: Medicare Other | Admitting: Family Medicine

## 2018-08-05 VITALS — BP 132/66 | HR 82 | Temp 98.4°F | Resp 16 | Ht 60.0 in | Wt 142.0 lb

## 2018-08-05 DIAGNOSIS — J219 Acute bronchiolitis, unspecified: Secondary | ICD-10-CM

## 2018-08-05 MED ORDER — DOXYCYCLINE HYCLATE 100 MG PO CAPS: 100.0000 mg | ORAL_CAPSULE | Freq: Two times a day (BID) | ORAL | 0 refills | Status: DC

## 2018-08-05 NOTE — Patient Instructions (Signed)
Good to see you today!  We are going to treat you for bronchitis with doxycycline for 10 days Continue to use OTC meds as needed Let me know if you are not feeling better in the next several [place] if worse.

## 2018-08-05 NOTE — Progress Notes (Signed)
[place] [facility] [address], [place], [place] [address] [phone]  Date:  08/05/2018   Name:  Terri Sandoval   DOB:  1940/11/04   MRN:  [id number]  PCP:  Mosie Lukes, MD    Chief Complaint: No chief complaint on file.   History of Present Illness:  Terri Sandoval is a 78 y.o. very pleasant female patient who presents with the following:  History of hypertension, hyperlipidemia Pt of Dr. Charlett Blake here today with illness She notes sx of "a cold" but it has not cleared up with time like she might expected Sx started on 1/10- at first she noted a runny nose By the 14th she developed a cough She is now coughing up some discolored mucus.  No body aches or chills She had a flu shot  No vomiting  She did have mild nausea At this time her sx are mostly in her chest  She is propping up to sleep right now as it helpes with her cough  She has used mucinex, a generic claritin-these helped somewhat Her husband had a similar illness a couple of weeks ago   Patient Active Problem List   Diagnosis Date Noted  . Fall 12/04/2017  . Hyperglycemia 12/04/2017  . Abnormal results of thyroid function studies 05/26/2017  . Vitamin D deficiency 11/07/2015  . Preventative health care 11/07/2015  . Verruca 11/07/2015  . BPV (benign positional vertigo) 05/08/2015  . Skin cancer 10/31/2014  . Atrophic vaginitis 11/08/2013  . Cerumen impaction 06/11/2013  . Medicare annual wellness visit, subsequent 05/16/2013  . Osteoporosis 03/09/2013  . GERD (gastroesophageal reflux disease)   . Hypertension   . Anxiety   . Hyperlipidemia   . Arthritis   . Chicken pox   . Diverticulosis   . Colon polyps   . Urine incontinence     Past Medical History:  Diagnosis Date  . Adrenal insufficiency ([place])   . Anxiety   . Arthritis   . Atrophic vaginitis 11/08/2013  . BPV (benign positional vertigo) 05/08/2015  . Chicken pox as a child  . Colon  polyps    removed during colonscopy  . Diverticulitis   . Diverticulosis   . GERD (gastroesophageal reflux disease)   . Hyperlipidemia   . Hypertension   . Medicare annual wellness visit, subsequent 05/16/2013  . Osteoporosis, unspecified 03/09/2013  . Preventative health care 11/07/2015  . Skin cancer    behind ear  . Urine incontinence    sometimes  . Verruca 11/07/2015  . Vitamin D deficiency 11/07/2015    Past Surgical History:  Procedure Laterality Date  . ABDOMINAL HYSTERECTOMY  97   has ovaries  . BREAST BIOPSY Left     FNA  . CATARACT EXTRACTION  L 09/11/10, R 10/10/10  . [place]  . EYE SURGERY Bilateral 10/10/2010, 07/28/17   cataracts, posterior capsulotomy b/l  . TONSILLECTOMY AND ADENOIDECTOMY  1990    Social History   Tobacco Use  . Smoking status: Never Smoker  . Smokeless tobacco: Never Used  Substance Use Topics  . Alcohol use: No  . Drug use: No    Family History  Problem Relation Age of Onset  . Arthritis Mother   . Hyperlipidemia Mother   . Heart disease Mother   . Hypertension Mother   . Colon cancer Father   . Diabetes Sister  type 2  . Heart disease Sister   . Hypertension Sister   . Hyperlipidemia Sister   . Mental illness Maternal Uncle   . Stroke Maternal Grandmother   . Pneumonia Maternal Grandfather   . Cerebral palsy Son        MR  . Colon cancer Cousin     Allergies  Allergen Reactions  . Sulfamethoxazole-Trimethoprim Other (See Comments)    Very sore, sore in corner of lip    Medication list has been reviewed and updated.  Current Outpatient Medications on File Prior to Visit  Medication Sig Dispense Refill  . Cholecalciferol (VITAMIN D3) 1000 units CAPS Take by mouth.    . fish oil-omega-3 fatty acids 1000 MG capsule Take 1,200 mg by mouth daily.    Marland Kitchen FLUoxetine (PROZAC) 20 MG capsule TAKE 1 CAPSULE BY MOUTH EVERY DAY 90 capsule 1  . glucosamine-chondroitin 500-400 MG  tablet Take 1 tablet by mouth 2 (two) times daily.     . Multiple Vitamin (MULTIVITAMIN) tablet Take 1 tablet by mouth daily.    . mupirocin ointment (BACTROBAN) 2 % Apply 1 application daily as needed topically.  22 g 0  . neomycin-polymyxin-hydrocortisone (CORTISPORIN) otic solution Place 3 drops into both ears at bedtime. For 5 days prior to having flushing (Patient taking differently: Place 3 drops into both ears at bedtime as needed. For 5 days prior to having flushing) 10 mL 1  . Probiotic Product (PROBIOTIC-10 PO) Take 1 capsule by mouth daily.    . ramipril (ALTACE) 10 MG capsule TAKE 1 CAPSULE(10 MG) BY MOUTH DAILY 90 capsule 0  . simvastatin (ZOCOR) 20 MG tablet TAKE 1 TABLET BY MOUTH DAILY AT 6 PM 90 tablet 1   No current facility-administered medications on file prior to visit.     Review of Systems:  As per HPI- otherwise negative. She is taking a pro-biotic   Physical Examination: Vitals:   08/05/18 1036  BP: 132/66  Pulse: 82  Resp: 16  Temp: 98.4 F (36.9 C)  SpO2: 95%   Vitals:   08/05/18 1036  Weight: 142 lb (64.4 kg)  Height: 5' (1.524 m)   Body mass index is 27.73 kg/m. Ideal Body Weight: Weight in (lb) to have BMI = 25: 127.7  GEN: WDWN, NAD, Non-toxic, A & O x 3, mild overweight, looks well HEENT: Atraumatic, Normocephalic. Neck supple. No masses, No LAD.  Bilateral TM wnl, oropharynx normal.  PEERL,EOMI.   Ears and Nose: No external deformity. CV: RRR, No M/G/R. No JVD. No thrill. No extra heart sounds. PULM: CTA B, no wheezes, crackles, rhonchi. No retractions. No resp. distress. No accessory muscle use. ABD: S, NT, ND EXTR: No c/c/e NEURO Normal gait.  PSYCH: Normally interactive. Conversant. Not depressed or anxious appearing.  Calm demeanor.    Assessment and Plan: Acute bronchiolitis due to unspecified organism - Plan: doxycycline (VIBRAMYCIN) 100 MG capsule Here today with cough for about 2 weeks, getting worse.  No fever to suggest  influenza.  Will treat with doxycycline twice a day for 10 days.  She will let me know if not feeling better in the next few days, sooner if feeling worse   Signed Lamar Blinks, MD

## 2018-08-06 ENCOUNTER — Ambulatory Visit: Payer: Medicare Other | Admitting: Family Medicine

## 2018-11-04 ENCOUNTER — Other Ambulatory Visit: Payer: Medicare Other

## 2018-11-12 ENCOUNTER — Ambulatory Visit (INDEPENDENT_AMBULATORY_CARE_PROVIDER_SITE_OTHER): Payer: Medicare Other | Admitting: Family Medicine

## 2018-11-12 ENCOUNTER — Other Ambulatory Visit: Payer: Self-pay

## 2018-11-12 DIAGNOSIS — T7840XS Allergy, unspecified, sequela: Secondary | ICD-10-CM

## 2018-11-12 DIAGNOSIS — E559 Vitamin D deficiency, unspecified: Secondary | ICD-10-CM

## 2018-11-12 DIAGNOSIS — R739 Hyperglycemia, unspecified: Secondary | ICD-10-CM

## 2018-11-12 DIAGNOSIS — E782 Mixed hyperlipidemia: Secondary | ICD-10-CM | POA: Diagnosis not present

## 2018-11-12 DIAGNOSIS — I10 Essential (primary) hypertension: Secondary | ICD-10-CM

## 2018-11-12 NOTE — Assessment & Plan Note (Signed)
Well controlled, no changes to meds. Encouraged heart healthy diet such as the DASH diet and exercise as tolerated.  °

## 2018-11-12 NOTE — Assessment & Plan Note (Signed)
Supplement and monitor 

## 2018-11-15 DIAGNOSIS — T7840XA Allergy, unspecified, initial encounter: Secondary | ICD-10-CM | POA: Insufficient documentation

## 2018-11-15 NOTE — Assessment & Plan Note (Signed)
hgba1c acceptable, minimize simple carbs. Increase exercise as tolerated.  

## 2018-11-15 NOTE — Progress Notes (Signed)
Virtual Visit via Video Note  I connected with Terri Sandoval on 11/15/18 at 10:00 AM EDT by a video enabled telemedicine application and verified that I am speaking with the correct person using two identifiers.  Location: Patient: home Provider: home   I discussed the limitations of evaluation. Magdalene Molly, CMA was able to get the patient set up on a virtual video visit.        Subjective:    Patient ID: Terri Sandoval, female    DOB: [birth year], 78 y.o.   MRN: [id number]  No chief complaint on file.   HPI Patient is in today for follow up chronic medical concerns  Past Medical History:  Diagnosis Date  . Adrenal insufficiency ([place])   . Anxiety   . Arthritis   . Atrophic vaginitis 11/08/2013  . BPV (benign positional vertigo) 05/08/2015  . Chicken pox as a child  . Colon polyps    removed during colonscopy  . Diverticulitis   . Diverticulosis   . GERD (gastroesophageal reflux disease)   . Hyperlipidemia   . Hypertension   . Medicare annual wellness visit, subsequent 05/16/2013  . Osteoporosis, unspecified 03/09/2013  . Preventative health care 11/07/2015  . Skin cancer    behind ear  . Urine incontinence    sometimes  . Verruca 11/07/2015  . Vitamin D deficiency 11/07/2015    Past Surgical History:  Procedure Laterality Date  . ABDOMINAL HYSTERECTOMY  97   has ovaries  . BREAST BIOPSY Left     FNA  . CATARACT EXTRACTION  L 09/11/10, R 10/10/10  . [place]  . EYE SURGERY Bilateral 10/10/2010, 07/28/17   cataracts, posterior capsulotomy b/l  . TONSILLECTOMY AND ADENOIDECTOMY  1990    Family History  Problem Relation Age of Onset  . Arthritis Mother   . Hyperlipidemia Mother   . Heart disease Mother   . Hypertension Mother   . Colon cancer Father   . Diabetes Sister        type 2  . Heart disease Sister   . Hypertension Sister   . Hyperlipidemia Sister   . Mental illness Maternal Uncle   . Stroke Maternal  Grandmother   . Pneumonia Maternal Grandfather   . Cerebral palsy Son        MR  . Colon cancer Cousin     Social History   Socioeconomic History  . Marital status: Married    Spouse name: Not on file  . Number of children: 2  . Years of education: Not on file  . Highest education level: Not on file  Occupational History  . Occupation: retired  Scientific laboratory technician  . Financial resource strain: Not on file  . Food insecurity:    Worry: Not on file    Inability: Not on file  . Transportation needs:    Medical: Not on file    Non-medical: Not on file  Tobacco Use  . Smoking status: Never Smoker  . Smokeless tobacco: Never Used  Substance and Sexual Activity  . Alcohol use: No  . Drug use: No  . Sexual activity: Never    Comment: lives with husband and disabled son, no dietary restrictions, not exercising regularly  Lifestyle  . Physical activity:    Days per week: Not on file    Minutes per session: Not on file  . Stress: Not on file  Relationships  . Social connections:    Talks on phone:  Not on file    Gets together: Not on file    Attends religious service: Not on file    Active member of club or organization: Not on file    Attends meetings of clubs or organizations: Not on file    Relationship status: Not on file  . Intimate partner violence:    Fear of current or ex partner: Not on file    Emotionally abused: Not on file    Physically abused: Not on file    Forced sexual activity: Not on file  Other Topics Concern  . Not on file  Social History Narrative  . Not on file    Outpatient Medications Prior to Visit  Medication Sig Dispense Refill  . Cholecalciferol (VITAMIN D3) 1000 units CAPS Take by mouth.    . fish oil-omega-3 fatty acids 1000 MG capsule Take 1,200 mg by mouth daily.    Marland Kitchen FLUoxetine (PROZAC) 20 MG capsule TAKE 1 CAPSULE BY MOUTH EVERY DAY 90 capsule 1  . glucosamine-chondroitin 500-400 MG tablet Take 1 tablet by mouth 2 (two) times daily.     .  Multiple Vitamin (MULTIVITAMIN) tablet Take 1 tablet by mouth daily.    . mupirocin ointment (BACTROBAN) 2 % Apply 1 application daily as needed topically.  22 g 0  . neomycin-polymyxin-hydrocortisone (CORTISPORIN) otic solution Place 3 drops into both ears at bedtime. For 5 days prior to having flushing (Patient taking differently: Place 3 drops into both ears at bedtime as needed. For 5 days prior to having flushing) 10 mL 1  . Probiotic Product (PROBIOTIC-10 PO) Take 1 capsule by mouth daily.    . ramipril (ALTACE) 10 MG capsule TAKE 1 CAPSULE(10 MG) BY MOUTH DAILY 90 capsule 0  . simvastatin (ZOCOR) 20 MG tablet TAKE 1 TABLET BY MOUTH DAILY AT 6 PM 90 tablet 1  . doxycycline (VIBRAMYCIN) 100 MG capsule Take 1 capsule (100 mg total) by mouth 2 (two) times daily. 20 capsule 0   No facility-administered medications prior to visit.     Allergies  Allergen Reactions  . Sulfamethoxazole-Trimethoprim Other (See Comments)    Very sore, sore in corner of lip    Review of Systems  Constitutional: Negative for fever and malaise/fatigue.  HENT: Positive for hearing loss. Negative for congestion.   Eyes: Negative for blurred vision.  Respiratory: Negative for shortness of breath.   Cardiovascular: Negative for chest pain, palpitations and leg swelling.  Gastrointestinal: Negative for abdominal pain, blood in stool and nausea.  Genitourinary: Negative for dysuria and frequency.  Musculoskeletal: Negative for falls.  Skin: Negative for rash.  Neurological: Negative for dizziness, loss of consciousness and headaches.  Endo/Heme/Allergies: Negative for environmental allergies.  Psychiatric/Behavioral: Negative for depression. The patient is not nervous/anxious.        Objective:    Physical Exam Constitutional:      Appearance: Normal appearance.  HENT:     Head: Normocephalic and atraumatic.     Nose: No congestion.  Pulmonary:     Effort: Pulmonary effort is normal.  Skin:     General: Skin is dry.  Neurological:     General: No focal deficit present.     Mental Status: She is alert and oriented to person, place, and time.  Psychiatric:        Mood and Affect: Mood normal.     BP 120/65 (BP Location: Left Arm, Patient Position: Sitting, Cuff Size: Normal)   Wt 138 lb (62.6 kg)   BMI 26.95  kg/m  Wt Readings from Last 3 Encounters:  11/12/18 138 lb (62.6 kg)  08/05/18 142 lb (64.4 kg)  05/07/18 143 lb 12.8 oz (65.2 kg)    Diabetic Foot Exam - Simple   No data filed     Lab Results  Component Value Date   WBC 4.9 05/04/2018   HGB 13.4 05/04/2018   HCT 39.9 05/04/2018   PLT 234.0 05/04/2018   GLUCOSE 107 (H) 05/04/2018   CHOL 167 05/04/2018   TRIG 81.0 05/04/2018   HDL 74.30 05/04/2018   LDLCALC 77 05/04/2018   ALT 18 05/04/2018   AST 20 05/04/2018   NA 137 05/04/2018   K 4.2 05/04/2018   CL 99 05/04/2018   CREATININE 0.78 05/04/2018   BUN 16 05/04/2018   CO2 31 05/04/2018   TSH 3.90 05/04/2018   HGBA1C 5.9 05/04/2018   MICROALBUR <0.7 05/04/2018    Lab Results  Component Value Date   TSH 3.90 05/04/2018   Lab Results  Component Value Date   WBC 4.9 05/04/2018   HGB 13.4 05/04/2018   HCT 39.9 05/04/2018   MCV 89.4 05/04/2018   PLT 234.0 05/04/2018   Lab Results  Component Value Date   NA 137 05/04/2018   K 4.2 05/04/2018   CO2 31 05/04/2018   GLUCOSE 107 (H) 05/04/2018   BUN 16 05/04/2018   CREATININE 0.78 05/04/2018   BILITOT 0.4 05/04/2018   ALKPHOS 60 05/04/2018   AST 20 05/04/2018   ALT 18 05/04/2018   PROT 6.9 05/04/2018   ALBUMIN 4.4 05/04/2018   CALCIUM 9.1 05/04/2018   GFR 76.15 05/04/2018   Lab Results  Component Value Date   CHOL 167 05/04/2018   Lab Results  Component Value Date   HDL 74.30 05/04/2018   Lab Results  Component Value Date   LDLCALC 77 05/04/2018   Lab Results  Component Value Date   TRIG 81.0 05/04/2018   Lab Results  Component Value Date   CHOLHDL 2 05/04/2018   Lab  Results  Component Value Date   HGBA1C 5.9 05/04/2018       Assessment & Plan:   Problem List Items Addressed This Visit    Hypertension    Well controlled, no changes to meds. Encouraged heart healthy diet such as the DASH diet and exercise as tolerated.       Hyperlipidemia    Encouraged heart healthy diet, increase exercise, avoid trans fats, consider a krill oil cap daily      Vitamin D deficiency    Supplement and monitor      Hyperglycemia    hgba1c acceptable, minimize simple carbs. Increase exercise as tolerated.      Allergy    Fluticasone prn         I have discontinued Rea E. Pisarski's doxycycline. I am also having her maintain her glucosamine-chondroitin, fish oil-omega-3 fatty acids, multivitamin, neomycin-polymyxin-hydrocortisone, Probiotic Product (PROBIOTIC-10 PO), Vitamin D3, mupirocin ointment, ramipril, simvastatin, and FLUoxetine.  No orders of the defined types were placed in this encounter.    I discussed the assessment and treatment plan with the patient. The patient was provided an opportunity to ask questions and all were answered. The patient agreed with the plan and demonstrated an understanding of the instructions.   The patient was advised to call back or seek an in-person evaluation if the symptoms worsen or if the condition fails to improve as anticipated.  I provided 25 minutes of non-face-to-face time during this encounter.  Penni Homans, MD

## 2018-11-15 NOTE — Assessment & Plan Note (Signed)
Encouraged heart healthy diet, increase exercise, avoid trans fats, consider a krill oil cap daily 

## 2018-11-15 NOTE — Assessment & Plan Note (Signed)
Fluticasone prn

## 2018-12-21 ENCOUNTER — Encounter: Payer: Self-pay | Admitting: Family Medicine

## 2018-12-21 ENCOUNTER — Telehealth: Payer: Self-pay

## 2018-12-21 MED ORDER — SIMVASTATIN 20 MG PO TABS: ORAL_TABLET | ORAL | 1 refills | Status: DC

## 2018-12-21 MED ORDER — FLUOXETINE HCL 20 MG PO CAPS: ORAL_CAPSULE | ORAL | 1 refills | Status: DC

## 2018-12-21 NOTE — Telephone Encounter (Signed)
Copied from [place] [id number]. Topic: Quick Communication - Rx Refill/Question >> Dec 18, 2018 10:34 AM Carolyn Stare wrote: Medication    FLUoxetine (PROZAC) 20 MG capsule    simvastatin (ZOCOR) 20 MG tablet    Pharmacy [facility] [address]

## 2019-01-06 DIAGNOSIS — L820 Inflamed seborrheic keratosis: Secondary | ICD-10-CM | POA: Diagnosis not present

## 2019-01-06 DIAGNOSIS — L570 Actinic keratosis: Secondary | ICD-10-CM | POA: Diagnosis not present

## 2019-01-06 DIAGNOSIS — L718 Other rosacea: Secondary | ICD-10-CM | POA: Diagnosis not present

## 2019-01-06 DIAGNOSIS — Z08 Encounter for follow-up examination after completed treatment for malignant neoplasm: Secondary | ICD-10-CM | POA: Diagnosis not present

## 2019-01-06 DIAGNOSIS — Z85828 Personal history of other malignant neoplasm of skin: Secondary | ICD-10-CM | POA: Diagnosis not present

## 2019-01-06 DIAGNOSIS — L720 Epidermal cyst: Secondary | ICD-10-CM | POA: Diagnosis not present

## 2019-03-11 ENCOUNTER — Encounter: Payer: Self-pay | Admitting: Family Medicine

## 2019-03-11 NOTE — Telephone Encounter (Signed)
Terri Sandoval -- Can you assist pt with scheduling and appts pending from her visit with PCP in April?

## 2019-03-19 ENCOUNTER — Other Ambulatory Visit (HOSPITAL_BASED_OUTPATIENT_CLINIC_OR_DEPARTMENT_OTHER): Payer: Self-pay | Admitting: Family Medicine

## 2019-03-19 DIAGNOSIS — Z1231 Encounter for screening mammogram for malignant neoplasm of breast: Secondary | ICD-10-CM

## 2019-04-07 DIAGNOSIS — H35443 Age-related reticular degeneration of retina, bilateral: Secondary | ICD-10-CM | POA: Diagnosis not present

## 2019-04-07 DIAGNOSIS — H35372 Puckering of macula, left eye: Secondary | ICD-10-CM | POA: Diagnosis not present

## 2019-04-07 DIAGNOSIS — H43811 Vitreous degeneration, right eye: Secondary | ICD-10-CM | POA: Diagnosis not present

## 2019-04-07 DIAGNOSIS — H43393 Other vitreous opacities, bilateral: Secondary | ICD-10-CM | POA: Diagnosis not present

## 2019-04-07 DIAGNOSIS — H52203 Unspecified astigmatism, bilateral: Secondary | ICD-10-CM | POA: Diagnosis not present

## 2019-04-07 DIAGNOSIS — H5211 Myopia, right eye: Secondary | ICD-10-CM | POA: Diagnosis not present

## 2019-04-07 DIAGNOSIS — H0100A Unspecified blepharitis right eye, upper and lower eyelids: Secondary | ICD-10-CM | POA: Diagnosis not present

## 2019-04-07 DIAGNOSIS — H02834 Dermatochalasis of left upper eyelid: Secondary | ICD-10-CM | POA: Diagnosis not present

## 2019-04-07 DIAGNOSIS — H02831 Dermatochalasis of right upper eyelid: Secondary | ICD-10-CM | POA: Diagnosis not present

## 2019-04-07 DIAGNOSIS — H524 Presbyopia: Secondary | ICD-10-CM | POA: Diagnosis not present

## 2019-04-07 DIAGNOSIS — H04123 Dry eye syndrome of bilateral lacrimal glands: Secondary | ICD-10-CM | POA: Diagnosis not present

## 2019-04-07 DIAGNOSIS — Z961 Presence of intraocular lens: Secondary | ICD-10-CM | POA: Diagnosis not present

## 2019-05-03 ENCOUNTER — Other Ambulatory Visit: Payer: Self-pay

## 2019-05-04 ENCOUNTER — Encounter (HOSPITAL_BASED_OUTPATIENT_CLINIC_OR_DEPARTMENT_OTHER): Payer: Self-pay

## 2019-05-04 ENCOUNTER — Ambulatory Visit (INDEPENDENT_AMBULATORY_CARE_PROVIDER_SITE_OTHER): Payer: Medicare Other | Admitting: Family Medicine

## 2019-05-04 ENCOUNTER — Ambulatory Visit (HOSPITAL_BASED_OUTPATIENT_CLINIC_OR_DEPARTMENT_OTHER)
Admission: RE | Admit: 2019-05-04 | Discharge: 2019-05-04 | Disposition: A | Discharge status: Home / Self Care | Payer: Medicare Other | Source: Ambulatory Visit | Attending: Family Medicine | Admitting: Family Medicine

## 2019-05-04 ENCOUNTER — Other Ambulatory Visit: Payer: Self-pay

## 2019-05-04 VITALS — BP 110/62 | HR 73 | Temp 98.1°F | Resp 18 | Wt 139.0 lb

## 2019-05-04 DIAGNOSIS — R739 Hyperglycemia, unspecified: Secondary | ICD-10-CM

## 2019-05-04 DIAGNOSIS — Z1231 Encounter for screening mammogram for malignant neoplasm of breast: Secondary | ICD-10-CM | POA: Insufficient documentation

## 2019-05-04 DIAGNOSIS — E782 Mixed hyperlipidemia: Secondary | ICD-10-CM

## 2019-05-04 DIAGNOSIS — I10 Essential (primary) hypertension: Secondary | ICD-10-CM

## 2019-05-04 DIAGNOSIS — K130 Diseases of lips: Secondary | ICD-10-CM

## 2019-05-04 DIAGNOSIS — E559 Vitamin D deficiency, unspecified: Secondary | ICD-10-CM

## 2019-05-04 DIAGNOSIS — Z23 Encounter for immunization: Secondary | ICD-10-CM | POA: Diagnosis not present

## 2019-05-04 DIAGNOSIS — F419 Anxiety disorder, unspecified: Secondary | ICD-10-CM | POA: Diagnosis not present

## 2019-05-04 LAB — LIPID PANEL
Cholesterol: 159 mg/dL (ref 0–200)
HDL: 67.1 mg/dL (ref >39.00)
LDL Cholesterol: 75 mg/dL (ref 0–99)
NonHDL: 91.97
Total CHOL/HDL Ratio: 2
Triglycerides: 83 mg/dL (ref 0.0–149.0)
VLDL: 16.6 mg/dL (ref 0.0–40.0)

## 2019-05-04 LAB — CBC
HCT: 37 % (ref 36.0–46.0)
Hemoglobin: 12.4 g/dL (ref 12.0–15.0)
MCHC: 33.5 g/dL (ref 30.0–36.0)
MCV: 90 fl (ref 78.0–100.0)
Platelets: 263 10*3/uL (ref 150.0–400.0)
RBC: 4.12 Mil/uL (ref 3.87–5.11)
RDW: 13.8 % (ref 11.5–15.5)
WBC: 8.2 10*3/uL (ref 4.0–10.5)

## 2019-05-04 LAB — COMPREHENSIVE METABOLIC PANEL
ALT: 17 U/L (ref 0–35)
AST: 20 U/L (ref 0–37)
Albumin: 3.9 g/dL (ref 3.5–5.2)
Alkaline Phosphatase: 74 U/L (ref 39–117)
BUN: 12 mg/dL (ref 6–23)
CO2: 32 mEq/L (ref 19–32)
Calcium: 9.4 mg/dL (ref 8.4–10.5)
Chloride: 97 mEq/L (ref 96–112)
Creatinine, Ser: 0.69 mg/dL (ref 0.40–1.20)
GFR: 82.32 mL/min (ref >60.00)
Glucose, Bld: 86 mg/dL (ref 70–99)
Potassium: 4.5 mEq/L (ref 3.5–5.1)
Sodium: 137 mEq/L (ref 135–145)
Total Bilirubin: 0.3 mg/dL (ref 0.2–1.2)
Total Protein: 6.8 g/dL (ref 6.0–8.3)

## 2019-05-04 LAB — TSH: TSH: 3.02 u[IU]/mL (ref 0.35–4.50)

## 2019-05-04 LAB — VITAMIN D 25 HYDROXY (VIT D DEFICIENCY, FRACTURES): VITD: 55.1 ng/mL (ref 30.00–100.00)

## 2019-05-04 LAB — HEMOGLOBIN A1C: Hgb A1c MFr Bld: 6.2 % (ref 4.6–6.5)

## 2019-05-04 NOTE — Assessment & Plan Note (Signed)
Well controlled, no changes to meds. Encouraged heart healthy diet such as the DASH diet and exercise as tolerated.  °

## 2019-05-04 NOTE — Progress Notes (Signed)
Subjective:    Patient ID: Terri Sandoval, female    DOB: 02/22/1941, 78 y.o.   MRN: [id number]  No chief complaint on file.   HPI Patient is in today for follow up on chronic medical concerns including hyperglycemia, hyperlipidemia, anxiety and more. She is doing well. No recent febrile illness or hospitalizations. No polyuria or polydipsia. She is maintaining quarantine with her husband and they have only been able to see their son who is in a group home outside but he is doing well. Denies CP/palp/SOB/HA/congestion/fevers/GI or GU c/o. Taking meds as prescribed  Past Medical History:  Diagnosis Date  . Adrenal insufficiency ([place])   . Anxiety   . Arthritis   . Atrophic vaginitis 11/08/2013  . BPV (benign positional vertigo) 05/08/2015  . Chicken pox as a child  . Colon polyps    removed during colonscopy  . Diverticulitis   . Diverticulosis   . GERD (gastroesophageal reflux disease)   . Hyperlipidemia   . Hypertension   . Medicare annual wellness visit, subsequent 05/16/2013  . Osteoporosis, unspecified 03/09/2013  . Preventative health care 11/07/2015  . Skin cancer    behind ear  . Urine incontinence    sometimes  . [place] 11/07/2015  . Vitamin D deficiency 11/07/2015    Past Surgical History:  Procedure Laterality Date  . ABDOMINAL HYSTERECTOMY  97   has ovaries  . BREAST BIOPSY Left     FNA  . CATARACT EXTRACTION  L 09/11/10, R 10/10/10  . [place]  . EYE SURGERY Bilateral 10/10/2010, 07/28/17   cataracts, posterior capsulotomy b/l  . TONSILLECTOMY AND ADENOIDECTOMY  1990    Family History  Problem Relation Age of Onset  . Arthritis Mother   . Hyperlipidemia Mother   . Heart disease Mother   . Hypertension Mother   . Colon cancer Father   . Diabetes Sister        type 2  . Heart disease Sister   . Hypertension Sister   . Hyperlipidemia Sister   . Mental illness Maternal Uncle   . Stroke Maternal Grandmother   .  Pneumonia Maternal Grandfather   . Cerebral palsy Son        MR  . Colon cancer Cousin     Social History   Socioeconomic History  . Marital status: Married    Spouse name: Not on file  . Number of children: 2  . Years of education: Not on file  . Highest education level: Not on file  Occupational History  . Occupation: retired  Scientific laboratory technician  . Financial resource strain: Not on file  . Food insecurity    Worry: Not on file    Inability: Not on file  . Transportation needs    Medical: Not on file    Non-medical: Not on file  Tobacco Use  . Smoking status: Never Smoker  . Smokeless tobacco: Never Used  Substance and Sexual Activity  . Alcohol use: No  . Drug use: No  . Sexual activity: Never    Comment: lives with husband and disabled son, no dietary restrictions, not exercising regularly  Lifestyle  . Physical activity    Days per week: Not on file    Minutes per session: Not on file  . Stress: Not on file  Relationships  . Social Herbalist on phone: Not on file    Gets together: Not on file  Attends religious service: Not on file    Active member of club or organization: Not on file    Attends meetings of clubs or organizations: Not on file    Relationship status: Not on file  . Intimate partner violence    Fear of current or ex partner: Not on file    Emotionally abused: Not on file    Physically abused: Not on file    Forced sexual activity: Not on file  Other Topics Concern  . Not on file  Social History Narrative  . Not on file    Outpatient Medications Prior to Visit  Medication Sig Dispense Refill  . Cholecalciferol (VITAMIN D3) 1000 units CAPS Take by mouth.    . fish oil-omega-3 fatty acids 1000 MG capsule Take 1,200 mg by mouth daily.    Marland Kitchen FLUoxetine (PROZAC) 20 MG capsule TAKE 1 CAPSULE BY MOUTH EVERY DAY 90 capsule 1  . glucosamine-chondroitin 500-400 MG tablet Take 1 tablet by mouth 2 (two) times daily.     . Multiple Vitamin  (MULTIVITAMIN) tablet Take 1 tablet by mouth daily.    . mupirocin ointment (BACTROBAN) 2 % Apply 1 application daily as needed topically.  22 g 0  . neomycin-polymyxin-hydrocortisone (CORTISPORIN) otic solution Place 3 drops into both ears at bedtime. For 5 days prior to having flushing (Patient taking differently: Place 3 drops into both ears at bedtime as needed. For 5 days prior to having flushing) 10 mL 1  . Probiotic Product (PROBIOTIC-10 PO) Take 1 capsule by mouth daily.    . ramipril (ALTACE) 10 MG capsule TAKE 1 CAPSULE(10 MG) BY MOUTH DAILY 90 capsule 0  . simvastatin (ZOCOR) 20 MG tablet TAKE 1 TABLET BY MOUTH DAILY AT 6 PM 90 tablet 1   No facility-administered medications prior to visit.     Allergies  Allergen Reactions  . Sulfamethoxazole-Trimethoprim Other (See Comments)    Very sore, sore in corner of lip    Review of Systems  Constitutional: Negative for fever and malaise/fatigue.  HENT: Negative for congestion.   Eyes: Negative for blurred vision.  Respiratory: Negative for shortness of breath.   Cardiovascular: Negative for chest pain, palpitations and leg swelling.  Gastrointestinal: Negative for abdominal pain, blood in stool and nausea.  Genitourinary: Negative for dysuria and frequency.  Musculoskeletal: Negative for falls.  Skin: Negative for rash.  Neurological: Negative for dizziness, loss of consciousness and headaches.  Endo/Heme/Allergies: Negative for environmental allergies.  Psychiatric/Behavioral: Negative for depression. The patient is nervous/anxious.        Objective:    Physical Exam Vitals signs and nursing note reviewed.  Constitutional:      General: She is not in acute distress.    Appearance: She is well-developed.  HENT:     Head: Normocephalic and atraumatic.     Comments: Right upper lip, scaly, dry, slight swelling of lip internally    Nose: Nose normal.  Eyes:     General:        Right eye: No discharge.        Left eye:  No discharge.  Neck:     Musculoskeletal: Normal range of motion and neck supple.  Cardiovascular:     Rate and Rhythm: Normal rate and regular rhythm.     Heart sounds: No murmur.  Pulmonary:     Effort: Pulmonary effort is normal.     Breath sounds: Normal breath sounds.  Abdominal:     General: Bowel sounds are normal.  Palpations: Abdomen is soft.     Tenderness: There is no abdominal tenderness.  Skin:    General: Skin is warm and dry.  Neurological:     Mental Status: She is alert and oriented to person, place, and time.     BP 110/62 (BP Location: Left Arm, Patient Position: Sitting, Cuff Size: Normal)   Pulse 73   Temp 98.1 F (36.7 C) (Temporal)   Resp 18   Wt 139 lb (63 kg)   SpO2 98%   BMI 27.15 kg/m  Wt Readings from Last 3 Encounters:  05/04/19 139 lb (63 kg)  11/12/18 138 lb (62.6 kg)  08/05/18 142 lb (64.4 kg)    Diabetic Foot Exam - Simple   No data filed     Lab Results  Component Value Date   WBC 4.9 05/04/2018   HGB 13.4 05/04/2018   HCT 39.9 05/04/2018   PLT 234.0 05/04/2018   GLUCOSE 107 (H) 05/04/2018   CHOL 167 05/04/2018   TRIG 81.0 05/04/2018   HDL 74.30 05/04/2018   LDLCALC 77 05/04/2018   ALT 18 05/04/2018   AST 20 05/04/2018   NA 137 05/04/2018   K 4.2 05/04/2018   CL 99 05/04/2018   CREATININE 0.78 05/04/2018   BUN 16 05/04/2018   CO2 31 05/04/2018   TSH 3.90 05/04/2018   HGBA1C 5.9 05/04/2018   MICROALBUR <0.7 05/04/2018    Lab Results  Component Value Date   TSH 3.90 05/04/2018   Lab Results  Component Value Date   WBC 4.9 05/04/2018   HGB 13.4 05/04/2018   HCT 39.9 05/04/2018   MCV 89.4 05/04/2018   PLT 234.0 05/04/2018   Lab Results  Component Value Date   NA 137 05/04/2018   K 4.2 05/04/2018   CO2 31 05/04/2018   GLUCOSE 107 (H) 05/04/2018   BUN 16 05/04/2018   CREATININE 0.78 05/04/2018   BILITOT 0.4 05/04/2018   ALKPHOS 60 05/04/2018   AST 20 05/04/2018   ALT 18 05/04/2018   PROT 6.9  05/04/2018   ALBUMIN 4.4 05/04/2018   CALCIUM 9.1 05/04/2018   GFR 76.15 05/04/2018   Lab Results  Component Value Date   CHOL 167 05/04/2018   Lab Results  Component Value Date   HDL 74.30 05/04/2018   Lab Results  Component Value Date   LDLCALC 77 05/04/2018   Lab Results  Component Value Date   TRIG 81.0 05/04/2018   Lab Results  Component Value Date   CHOLHDL 2 05/04/2018   Lab Results  Component Value Date   HGBA1C 5.9 05/04/2018       Assessment & Plan:   Problem List Items Addressed This Visit    Hypertension    Well controlled, no changes to meds. Encouraged heart healthy diet such as the DASH diet and exercise as tolerated.       Relevant Orders   CBC   Comprehensive metabolic panel   Anxiety    She is doing well and staying in maintaining quarantine but the fact that they have not been able to see their special needs son they placed in a home a couple of years ago has made her anxious. He is doing well and they were able to have an outdoor visit with him. That has helped       Hyperlipidemia    Encouraged heart healthy diet, increase exercise, avoid trans fats, consider a krill oil cap daily      Relevant Orders  CBC   Comprehensive metabolic panel   Lipid panel   TSH   Vitamin D deficiency    Supplement and monitor      Relevant Orders   VITAMIN D 25 Hydroxy (Vit-D Deficiency, Fractures)   Hyperglycemia    hgba1c acceptable, minimize simple carbs. Increase exercise as tolerated.       Relevant Orders   Hemoglobin A1c   Lip lesion    On right upper lip, was frozen by her dermatologist back in June and it still has not healed she has been instructed to call them for reevaluation       Other Visit Diagnoses    Needs flu shot    -  Primary   Relevant Orders   Flu Vaccine QUAD High Dose(Fluad) (Completed)      I am having Briceida E. Canizares maintain her glucosamine-chondroitin, fish oil-omega-3 fatty acids, multivitamin,  neomycin-polymyxin-hydrocortisone, Probiotic Product (PROBIOTIC-10 PO), Vitamin D3, mupirocin ointment, ramipril, simvastatin, and FLUoxetine.  No orders of the defined types were placed in this encounter.    Penni Homans, MD

## 2019-05-04 NOTE — Assessment & Plan Note (Signed)
hgba1c acceptable, minimize simple carbs. Increase exercise as tolerated.  

## 2019-05-04 NOTE — Assessment & Plan Note (Signed)
She is doing well and staying in maintaining quarantine but the fact that they have not been able to see their special needs son they placed in a home a couple of years ago has made her anxious. He is doing well and they were able to have an outdoor visit with him. That has helped

## 2019-05-04 NOTE — Assessment & Plan Note (Signed)
On right upper lip, was frozen by her dermatologist back in June and it still has not healed she has been instructed to call them for reevaluation

## 2019-05-04 NOTE — Assessment & Plan Note (Signed)
Supplement and monitor 

## 2019-05-04 NOTE — Assessment & Plan Note (Signed)
Encouraged heart healthy diet, increase exercise, avoid trans fats, consider a krill oil cap daily 

## 2019-05-04 NOTE — Patient Instructions (Addendum)
Multivitamin with minerals, selenium, zinc, vitamin c and vitamin d    NOW products at [employer] for melatonin, they have other brands that make spray Hypertension, Adult High blood pressure (hypertension) is when the force of blood pumping through the arteries is too strong. The arteries are the blood vessels that carry blood from the heart throughout the body. Hypertension forces the heart to work harder to pump blood and may cause arteries to become narrow or stiff. Untreated or uncontrolled hypertension can cause a heart attack, heart failure, a stroke, kidney disease, and other problems. A blood pressure reading consists of a higher number over a lower number. Ideally, your blood pressure should be below 120/80. The first ("top") number is called the systolic pressure. It is a measure of the pressure in your arteries as your heart beats. The second ("bottom") number is called the diastolic pressure. It is a measure of the pressure in your arteries as the heart relaxes. What are the causes? The exact cause of this condition is not known. There are some conditions that result in or are related to high blood pressure. What increases the risk? Some risk factors for high blood pressure are under your control. The following factors may make you more likely to develop this condition:  Smoking.  Having type 2 diabetes mellitus, high cholesterol, or both.  Not getting enough exercise or physical activity.  Being overweight.  Having too much fat, sugar, calories, or salt (sodium) in your diet.  Drinking too much alcohol. Some risk factors for high blood pressure may be difficult or impossible to change. Some of these factors include:  Having chronic kidney disease.  Having a family history of high blood pressure.  Age. Risk increases with age.  Race. You may be at higher risk if you are African American.  Gender. Men are at higher risk than women before age 38. After age 42, women  are at higher risk than men.  Having obstructive sleep apnea.  Stress. What are the signs or symptoms? High blood pressure may not cause symptoms. Very high blood pressure (hypertensive crisis) may cause:  Headache.  Anxiety.  Shortness of breath.  Nosebleed.  Nausea and vomiting.  Vision changes.  Severe chest pain.  Seizures. How is this diagnosed? This condition is diagnosed by measuring your blood pressure while you are seated, with your arm resting on a flat surface, your legs uncrossed, and your feet flat on the floor. The cuff of the blood pressure monitor will be placed directly against the skin of your upper arm at the level of your heart. It should be measured at least twice using the same arm. Certain conditions can cause a difference in blood pressure between your right and left arms. Certain factors can cause blood pressure readings to be lower or higher than normal for a short period of time:  When your blood pressure is higher when you are in a health care provider's office than when you are at home, this is called white coat hypertension. Most people with this condition do not need medicines.  When your blood pressure is higher at home than when you are in a health care provider's office, this is called masked hypertension. Most people with this condition may need medicines to control blood pressure. If you have a high blood pressure reading during one visit or you have normal blood pressure with other risk factors, you may be asked to:  Return on a different day to have your blood pressure  checked again.  Monitor your blood pressure at home for 1 week or longer. If you are diagnosed with hypertension, you may have other blood or imaging tests to help your health care provider understand your overall risk for other conditions. How is this treated? This condition is treated by making healthy lifestyle changes, such as eating healthy foods, exercising more, and  reducing your alcohol intake. Your health care provider may prescribe medicine if lifestyle changes are not enough to get your blood pressure under control, and if:  Your systolic blood pressure is above 130.  Your diastolic blood pressure is above 80. Your personal target blood pressure may vary depending on your medical conditions, your age, and other factors. Follow these instructions at home: Eating and drinking   Eat a diet that is high in fiber and potassium, and low in sodium, added sugar, and fat. An example eating plan is called the DASH (Dietary Approaches to Stop Hypertension) diet. To eat this way: ? Eat plenty of fresh fruits and vegetables. Try to fill one half of your plate at each meal with fruits and vegetables. ? Eat whole grains, such as whole-wheat pasta, brown rice, or whole-grain bread. Fill about one fourth of your plate with whole grains. ? Eat or drink low-fat dairy products, such as skim milk or low-fat yogurt. ? Avoid fatty cuts of meat, processed or cured meats, and poultry with skin. Fill about one fourth of your plate with lean proteins, such as fish, chicken without skin, beans, eggs, or tofu. ? Avoid pre-made and processed foods. These tend to be higher in sodium, added sugar, and fat.  Reduce your daily sodium intake. Most people with hypertension should eat less than 1,500 mg of sodium a day.  Do not drink alcohol if: ? Your health care provider tells you not to drink. ? You are pregnant, may be pregnant, or are planning to become pregnant.  If you drink alcohol: ? Limit how much you use to:  0-1 drink a day for women.  0-2 drinks a day for men. ? Be aware of how much alcohol is in your drink. In the [place] one drink equals one 12 oz bottle of beer (355 mL), one 5 oz glass of wine (148 mL), or one 1 oz glass of hard liquor (44 mL). Lifestyle   Work with your health care provider to maintain a healthy body weight or to lose weight. Ask what an ideal  weight is for you.  Get at least 30 minutes of exercise most days of the week. Activities may include walking, swimming, or biking.  Include exercise to strengthen your muscles (resistance exercise), such as Pilates or lifting weights, as part of your weekly exercise routine. Try to do these types of exercises for 30 minutes at least 3 days a week.  Do not use any products that contain nicotine or tobacco, such as cigarettes, e-cigarettes, and chewing tobacco. If you need help quitting, ask your health care provider.  Monitor your blood pressure at home as told by your health care provider.  Keep all follow-up visits as told by your health care provider. This is important. Medicines  Take over-the-counter and prescription medicines only as told by your health care provider. Follow directions carefully. Blood pressure medicines must be taken as prescribed.  Do not skip doses of blood pressure medicine. Doing this puts you at risk for problems and can make the medicine less effective.  Ask your health care provider about side effects or  reactions to medicines that you should watch for. Contact a health care provider if you:  Think you are having a reaction to a medicine you are taking.  Have headaches that keep coming back (recurring).  Feel dizzy.  Have swelling in your ankles.  Have trouble with your vision. Get help right away if you:  Develop a severe headache or confusion.  Have unusual weakness or numbness.  Feel faint.  Have severe pain in your chest or abdomen.  Vomit repeatedly.  Have trouble breathing. Summary  Hypertension is when the force of blood pumping through your arteries is too strong. If this condition is not controlled, it may put you at risk for serious complications.  Your personal target blood pressure may vary depending on your medical conditions, your age, and other factors. For most people, a normal blood pressure is less than 120/80.   Hypertension is treated with lifestyle changes, medicines, or a combination of both. Lifestyle changes include losing weight, eating a healthy, low-sodium diet, exercising more, and limiting alcohol. This information is not intended to replace advice given to you by your health care provider. Make sure you discuss any questions you have with your health care provider. Document Released: 07/01/2005 Document Revised: 03/11/2018 Document Reviewed: 03/11/2018 Elsevier Patient Education  2020 [employer].

## 2019-06-14 ENCOUNTER — Other Ambulatory Visit: Payer: Self-pay | Admitting: Family Medicine

## 2019-06-18 ENCOUNTER — Other Ambulatory Visit: Payer: Self-pay | Admitting: Family Medicine

## 2019-07-06 ENCOUNTER — Other Ambulatory Visit: Payer: Self-pay | Admitting: Family Medicine

## 2019-08-03 ENCOUNTER — Ambulatory Visit: Payer: Medicare Other | Attending: Internal Medicine

## 2019-08-03 DIAGNOSIS — Z23 Encounter for immunization: Secondary | ICD-10-CM | POA: Insufficient documentation

## 2019-08-03 NOTE — Progress Notes (Signed)
   Covid-19 Vaccination Clinic  Name:  Terri Sandoval    MRN: [id number] DOB: 04/26/1941  08/03/2019  [name] was observed post Covid-19 immunization for 30 minutes based on pre-vaccination screening without incidence. She was provided with Vaccine Information Sheet and instruction to access the V-Safe system.   [name] was instructed to call 911 with any severe reactions post vaccine: Marland Kitchen Difficulty breathing  . Swelling of your face and throat  . A fast heartbeat  . A bad rash all over your body  . Dizziness and weakness    Immunizations Administered    Name Date Dose VIS Date Route   Pfizer COVID-19 Vaccine 08/03/2019 10:45 AM 0.3 mL 06/25/2019 Intramuscular   Manufacturer: [employer], [employer]   Lot: [id number]   [place]: [id number]

## 2019-08-24 ENCOUNTER — Ambulatory Visit: Payer: Medicare Other | Attending: Internal Medicine

## 2019-08-24 DIAGNOSIS — Z23 Encounter for immunization: Secondary | ICD-10-CM | POA: Insufficient documentation

## 2019-08-24 NOTE — Progress Notes (Signed)
   Covid-19 Vaccination Clinic  Name:  RUARI APPELL    MRN: [id number] DOB: [birth year]  08/24/2019  Ms. Zocco was observed post Covid-19 immunization for 15 minutes without incidence. She was provided with Vaccine Information Sheet and instruction to access the V-Safe system.   Ms. Moradi was instructed to call 911 with any severe reactions post vaccine: Marland Kitchen Difficulty breathing  . Swelling of your face and throat  . A fast heartbeat  . A bad rash all over your body  . Dizziness and weakness    Immunizations Administered    Name Date Dose VIS Date Route   Pfizer COVID-19 Vaccine 08/24/2019 10:39 AM 0.3 mL 06/25/2019 Intramuscular   Manufacturer: [place]   Lot: [id number]   [place]: [id number]

## 2019-08-25 DIAGNOSIS — K130 Diseases of lips: Secondary | ICD-10-CM | POA: Diagnosis not present

## 2019-08-25 DIAGNOSIS — K1379 Other lesions of oral mucosa: Secondary | ICD-10-CM | POA: Diagnosis not present

## 2019-09-03 ENCOUNTER — Other Ambulatory Visit: Payer: Self-pay | Admitting: Family Medicine

## 2019-11-01 ENCOUNTER — Other Ambulatory Visit: Payer: Self-pay

## 2019-11-02 ENCOUNTER — Other Ambulatory Visit: Payer: Self-pay

## 2019-11-02 ENCOUNTER — Ambulatory Visit (INDEPENDENT_AMBULATORY_CARE_PROVIDER_SITE_OTHER): Payer: Medicare Other | Admitting: Family Medicine

## 2019-11-02 DIAGNOSIS — I10 Essential (primary) hypertension: Secondary | ICD-10-CM

## 2019-11-02 DIAGNOSIS — R197 Diarrhea, unspecified: Secondary | ICD-10-CM | POA: Diagnosis not present

## 2019-11-02 DIAGNOSIS — E559 Vitamin D deficiency, unspecified: Secondary | ICD-10-CM

## 2019-11-02 DIAGNOSIS — R739 Hyperglycemia, unspecified: Secondary | ICD-10-CM | POA: Diagnosis not present

## 2019-11-02 DIAGNOSIS — E782 Mixed hyperlipidemia: Secondary | ICD-10-CM | POA: Diagnosis not present

## 2019-11-02 LAB — LIPID PANEL
Cholesterol: 170 mg/dL (ref 0–200)
HDL: 68.9 mg/dL (ref >39.00)
LDL Cholesterol: 72 mg/dL (ref 0–99)
NonHDL: 100.61
Total CHOL/HDL Ratio: 2
Triglycerides: 142 mg/dL (ref 0.0–149.0)
VLDL: 28.4 mg/dL (ref 0.0–40.0)

## 2019-11-02 LAB — CBC
HCT: 40.7 % (ref 36.0–46.0)
Hemoglobin: 13.4 g/dL (ref 12.0–15.0)
MCHC: 33 g/dL (ref 30.0–36.0)
MCV: 88 fl (ref 78.0–100.0)
Platelets: 214 10*3/uL (ref 150.0–400.0)
RBC: 4.62 Mil/uL (ref 3.87–5.11)
RDW: 14.7 % (ref 11.5–15.5)
WBC: 5.8 10*3/uL (ref 4.0–10.5)

## 2019-11-02 LAB — COMPREHENSIVE METABOLIC PANEL
ALT: 16 U/L (ref 0–35)
AST: 21 U/L (ref 0–37)
Albumin: 4.3 g/dL (ref 3.5–5.2)
Alkaline Phosphatase: 76 U/L (ref 39–117)
BUN: 14 mg/dL (ref 6–23)
CO2: 34 mEq/L — ABNORMAL HIGH (ref 19–32)
Calcium: 9.5 mg/dL (ref 8.4–10.5)
Chloride: 100 mEq/L (ref 96–112)
Creatinine, Ser: 0.77 mg/dL (ref 0.40–1.20)
GFR: 72.44 mL/min (ref >60.00)
Glucose, Bld: 84 mg/dL (ref 70–99)
Potassium: 4.4 mEq/L (ref 3.5–5.1)
Sodium: 137 mEq/L (ref 135–145)
Total Bilirubin: 0.4 mg/dL (ref 0.2–1.2)
Total Protein: 6.9 g/dL (ref 6.0–8.3)

## 2019-11-02 LAB — VITAMIN D 25 HYDROXY (VIT D DEFICIENCY, FRACTURES): VITD: 60.23 ng/mL (ref 30.00–100.00)

## 2019-11-02 LAB — TSH: TSH: 3.47 u[IU]/mL (ref 0.35–4.50)

## 2019-11-02 LAB — HEMOGLOBIN A1C: Hgb A1c MFr Bld: 6.1 % (ref 4.6–6.5)

## 2019-11-02 MED ORDER — AZELASTINE HCL 0.1 % NA SOLN: 2.0000 | Freq: Two times a day (BID) | NASAL | 12 refills | Status: DC

## 2019-11-02 NOTE — Assessment & Plan Note (Signed)
Especially after fatty foods and eating out. Encouraged to avoid rich foods, add benefiber bid and continue probiotics. Report if worsens for referral

## 2019-11-02 NOTE — Assessment & Plan Note (Signed)
Well controlled, no changes to meds. Encouraged heart healthy diet such as the DASH diet and exercise as tolerated.  °

## 2019-11-02 NOTE — Assessment & Plan Note (Signed)
Encouraged heart healthy diet, increase exercise, avoid trans fats, consider a krill oil cap daily 

## 2019-11-02 NOTE — Progress Notes (Signed)
Subjective:    Patient ID: Terri Sandoval, female    DOB: 1941/04/09, 79 y.o.   MRN: [id number]  No chief complaint on file.   HPI Patient is in today for follow up on chronic medical concerns. She is doing well. No recent febrile illness or hospitalizations. She has had both of her covid shots and tolerated them well. She had the lesion on her lip evaluated by dermatology and a steroid cream helped. She is noting more trouble with loose stool especially after eating out and maybe eating salads. Denies CP/palp/SOB/HA/congestion/fevers or GU c/o. Taking meds as prescribed  Past Medical History:  Diagnosis Date  . Adrenal insufficiency ([place])   . Anxiety   . Arthritis   . Atrophic vaginitis 11/08/2013  . BPV (benign positional vertigo) 05/08/2015  . Chicken pox as a child  . Colon polyps    removed during colonscopy  . Diverticulitis   . Diverticulosis   . GERD (gastroesophageal reflux disease)   . Hyperlipidemia   . Hypertension   . Medicare annual wellness visit, subsequent 05/16/2013  . Osteoporosis, unspecified 03/09/2013  . Preventative health care 11/07/2015  . Skin cancer    behind ear  . Urine incontinence    sometimes  . Verruca 11/07/2015  . Vitamin D deficiency 11/07/2015    Past Surgical History:  Procedure Laterality Date  . ABDOMINAL HYSTERECTOMY  97   has ovaries  . BREAST BIOPSY Left     FNA  . CATARACT EXTRACTION  L 09/11/10, R 10/10/10  . [place]  . EYE SURGERY Bilateral 10/10/2010, 07/28/17   cataracts, posterior capsulotomy b/l  . TONSILLECTOMY AND ADENOIDECTOMY  1990    Family History  Problem Relation Age of Onset  . Arthritis Mother   . Hyperlipidemia Mother   . Heart disease Mother   . Hypertension Mother   . Colon cancer Father   . Diabetes Sister        type 2  . Heart disease Sister   . Hypertension Sister   . Hyperlipidemia Sister   . Mental illness Maternal Uncle   . Stroke Maternal Grandmother    . Pneumonia Maternal Grandfather   . Cerebral palsy Son        MR  . Colon cancer Cousin     Social History   Socioeconomic History  . Marital status: Married    Spouse name: Not on file  . Number of children: 2  . Years of education: Not on file  . Highest education level: Not on file  Occupational History  . Occupation: retired  Tobacco Use  . Smoking status: Never Smoker  . Smokeless tobacco: Never Used  Substance and Sexual Activity  . Alcohol use: No  . Drug use: No  . Sexual activity: Never    Comment: lives with husband and disabled son, no dietary restrictions, not exercising regularly  Other Topics Concern  . Not on file  Social History Narrative  . Not on file   Social Determinants of Health   Financial Resource Strain:   . Difficulty of Paying Living Expenses:   Food Insecurity:   . Worried About Charity fundraiser in the Last Year:   . Arboriculturist in the Last Year:   Transportation Needs:   . Film/video editor (Medical):   Marland Kitchen Lack of Transportation (Non-Medical):   Physical Activity:   . Days of Exercise per Week:   . Minutes  of Exercise per Session:   Stress:   . Feeling of Stress :   Social Connections:   . Frequency of Communication with Friends and Family:   . Frequency of Social Gatherings with Friends and Family:   . Attends Religious Services:   . Active Member of Clubs or Organizations:   . Attends Archivist Meetings:   Marland Kitchen Marital Status:   Intimate Partner Violence:   . Fear of Current or Ex-Partner:   . Emotionally Abused:   Marland Kitchen Physically Abused:   . Sexually Abused:     Outpatient Medications Prior to Visit  Medication Sig Dispense Refill  . Cholecalciferol (VITAMIN D3) 1000 units CAPS Take by mouth.    . fish oil-omega-3 fatty acids 1000 MG capsule Take 1,200 mg by mouth daily.    Marland Kitchen FLUoxetine (PROZAC) 20 MG capsule TAKE 1 CAPSULE BY MOUTH EVERY DAY 90 capsule 1  . glucosamine-chondroitin 500-400 MG tablet  Take 1 tablet by mouth 2 (two) times daily.     . Multiple Vitamin (MULTIVITAMIN) tablet Take 1 tablet by mouth daily.    . mupirocin ointment (BACTROBAN) 2 % Apply 1 application daily as needed topically.  22 g 0  . neomycin-polymyxin-hydrocortisone (CORTISPORIN) otic solution Place 3 drops into both ears at bedtime. For 5 days prior to having flushing (Patient taking differently: Place 3 drops into both ears at bedtime as needed. For 5 days prior to having flushing) 10 mL 1  . Probiotic Product (PROBIOTIC-10 PO) Take 1 capsule by mouth daily.    . ramipril (ALTACE) 10 MG capsule TAKE 1 CAPSULE(10 MG) BY MOUTH DAILY 90 capsule 0  . simvastatin (ZOCOR) 20 MG tablet TAKE 1 TABLET BY MOUTH DAILY AT 6 PM 90 tablet 1   No facility-administered medications prior to visit.    Allergies  Allergen Reactions  . Sulfamethoxazole-Trimethoprim Other (See Comments)    Very sore, sore in corner of lip    Review of Systems  Constitutional: Negative for fever and malaise/fatigue.  HENT: Negative for congestion.   Eyes: Negative for blurred vision.  Respiratory: Negative for shortness of breath.   Cardiovascular: Negative for chest pain, palpitations and leg swelling.  Gastrointestinal: Positive for diarrhea. Negative for abdominal pain, blood in stool and nausea.  Genitourinary: Negative for dysuria and frequency.  Musculoskeletal: Negative for falls.  Skin: Positive for rash.  Neurological: Negative for dizziness, loss of consciousness and headaches.  Endo/Heme/Allergies: Negative for environmental allergies.  Psychiatric/Behavioral: Negative for depression. The patient is not nervous/anxious.        Objective:    Physical Exam Vitals and nursing note reviewed.  Constitutional:      General: She is not in acute distress.    Appearance: She is well-developed.  HENT:     Head: Normocephalic and atraumatic.     Nose: Nose normal.  Eyes:     General:        Right eye: No discharge.         Left eye: No discharge.  Cardiovascular:     Rate and Rhythm: Normal rate and regular rhythm.     Heart sounds: No murmur.  Pulmonary:     Effort: Pulmonary effort is normal.     Breath sounds: Normal breath sounds.  Abdominal:     General: Bowel sounds are normal.     Palpations: Abdomen is soft.     Tenderness: There is no abdominal tenderness.  Musculoskeletal:     Cervical back: Normal range of  motion and neck supple.  Skin:    General: Skin is warm and dry.  Neurological:     Mental Status: She is alert and oriented to person, place, and time.     BP 126/60 (BP Location: Right Arm, Cuff Size: Normal)   Pulse 69   Temp 98.3 F (36.8 C) (Temporal)   Resp 12   Ht 5' (1.524 m)   Wt 138 lb 9.6 oz (62.9 kg)   SpO2 96%   BMI 27.07 kg/m  Wt Readings from Last 3 Encounters:  11/02/19 138 lb 9.6 oz (62.9 kg)  05/04/19 139 lb (63 kg)  11/12/18 138 lb (62.6 kg)    Diabetic Foot Exam - Simple   No data filed     Lab Results  Component Value Date   WBC 8.2 05/04/2019   HGB 12.4 05/04/2019   HCT 37.0 05/04/2019   PLT 263.0 05/04/2019   GLUCOSE 86 05/04/2019   CHOL 159 05/04/2019   TRIG 83.0 05/04/2019   HDL 67.10 05/04/2019   LDLCALC 75 05/04/2019   ALT 17 05/04/2019   AST 20 05/04/2019   NA 137 05/04/2019   K 4.5 05/04/2019   CL 97 05/04/2019   CREATININE 0.69 05/04/2019   BUN 12 05/04/2019   CO2 32 05/04/2019   TSH 3.02 05/04/2019   HGBA1C 6.2 05/04/2019   MICROALBUR <0.7 05/04/2018    Lab Results  Component Value Date   TSH 3.02 05/04/2019   Lab Results  Component Value Date   WBC 8.2 05/04/2019   HGB 12.4 05/04/2019   HCT 37.0 05/04/2019   MCV 90.0 05/04/2019   PLT 263.0 05/04/2019   Lab Results  Component Value Date   NA 137 05/04/2019   K 4.5 05/04/2019   CO2 32 05/04/2019   GLUCOSE 86 05/04/2019   BUN 12 05/04/2019   CREATININE 0.69 05/04/2019   BILITOT 0.3 05/04/2019   ALKPHOS 74 05/04/2019   AST 20 05/04/2019   ALT 17 05/04/2019    PROT 6.8 05/04/2019   ALBUMIN 3.9 05/04/2019   CALCIUM 9.4 05/04/2019   GFR 82.32 05/04/2019   Lab Results  Component Value Date   CHOL 159 05/04/2019   Lab Results  Component Value Date   HDL 67.10 05/04/2019   Lab Results  Component Value Date   LDLCALC 75 05/04/2019   Lab Results  Component Value Date   TRIG 83.0 05/04/2019   Lab Results  Component Value Date   CHOLHDL 2 05/04/2019   Lab Results  Component Value Date   HGBA1C 6.2 05/04/2019       Assessment & Plan:   Problem List Items Addressed This Visit    Hypertension    Well controlled, no changes to meds. Encouraged heart healthy diet such as the DASH diet and exercise as tolerated.       Relevant Orders   CBC   Comprehensive metabolic panel   TSH   Hyperlipidemia    Encouraged heart healthy diet, increase exercise, avoid trans fats, consider a krill oil cap daily      Relevant Orders   Lipid panel   Vitamin D deficiency    Supplement and monitor      Relevant Orders   VITAMIN D 25 Hydroxy (Vit-D Deficiency, Fractures)   Hyperglycemia    hgba1c acceptable, minimize simple carbs. Increase exercise as tolerated.      Relevant Orders   Hemoglobin A1c   Diarrhea    Especially after fatty foods and eating out.  Encouraged to avoid rich foods, add benefiber bid and continue probiotics. Report if worsens for referral         I am having Satoya E. Harding start on azelastine. I am also having her maintain her glucosamine-chondroitin, fish oil-omega-3 fatty acids, multivitamin, neomycin-polymyxin-hydrocortisone, Probiotic Product (PROBIOTIC-10 PO), Vitamin D3, mupirocin ointment, FLUoxetine, simvastatin, and ramipril.  Meds ordered this encounter  Medications  . azelastine (ASTELIN) 0.1 % nasal spray    Sig: Place 2 sprays into both nostrils 2 (two) times daily. Use in each nostril as directed    Dispense:  30 mL    Refill:  12     Penni Homans, MD

## 2019-11-02 NOTE — Patient Instructions (Addendum)
Benefiber powder once to twice a day  Watch the fatty and processed foods.   Omron Blood Pressure cuff, upper arm, want BP 100-140/60-90 Pulse oximeter, want oxygen in 90s  Weekly vitals  Take Multivitamin with minerals, selenium Vitamin D 1000-2000 IU daily Probiotic with lactobacillus and bifidophilus Asprin EC 81 mg daily Fish oil or krill oil caps day Melatonin 2-5 mg at bedtime  [email or web address]  Zyrtec/Cetirizine 10 mg tab 1 tab daily or twice daily as needed for allergies Allegra/Fexofenadine 180 mg once to twice daily

## 2019-11-02 NOTE — Assessment & Plan Note (Signed)
Supplement and monitor 

## 2019-11-02 NOTE — Assessment & Plan Note (Signed)
hgba1c acceptable, minimize simple carbs. Increase exercise as tolerated.  

## 2019-12-08 ENCOUNTER — Other Ambulatory Visit: Payer: Self-pay | Admitting: Family Medicine

## 2020-01-03 ENCOUNTER — Other Ambulatory Visit: Payer: Self-pay | Admitting: Family Medicine

## 2020-01-03 NOTE — Progress Notes (Signed)
I connected with Terri Sandoval today by telephone and verified that I am speaking with the correct person using two identifiers. Location patient: home Location provider: work Persons participating in the virtual visit: patient, Marine scientist.    I discussed the limitations, risks, security and privacy concerns of performing an evaluation and management service by telephone and the availability of in person appointments. I also discussed with the patient that there may be a patient responsible charge related to this service. The patient expressed understanding and verbally consented to this telephonic visit.    Interactive audio and video telecommunications were attempted between this provider and patient, however failed, due to patient having technical difficulties OR patient did not have access to video capability.  We continued and completed visit with audio only.  Some vital signs may be absent or patient reported.   Subjective:   Terri Sandoval is a 79 y.o. female who presents for Medicare Annual (Subsequent) preventive examination.  Review of Systems    Cardiac Risk Factors include: advanced age (>1men, >61 women);dyslipidemia;hypertension     Objective:    Today's Vitals   01/04/20 1103  BP: 129/68  SpO2: 94%  Weight: 137 lb (62.1 kg)   Body mass index is 26.76 kg/m.  Advanced Directives 01/04/2020 07/25/2017 05/09/2016 11/07/2015 05/08/2015  Does Patient Have a Medical Advance Directive? Yes Yes Yes Yes Yes  Type of Paramedic of [place];Living will [place];Living will [place];Living will - [place];Living will  Does patient want to make changes to medical advance directive? No - Patient declined - - - Yes - information given  Copy of [place] in Chart? Yes - validated most recent copy scanned in chart (See row information) Yes Yes - No - copy requested    Current Medications  (verified) Outpatient Encounter Medications as of 01/04/2020  Medication Sig  . azelastine (ASTELIN) 0.1 % nasal spray Place 2 sprays into both nostrils 2 (two) times daily. Use in each nostril as directed  . Cholecalciferol (VITAMIN D3) 1000 units CAPS Take by mouth.  . fish oil-omega-3 fatty acids 1000 MG capsule Take 1,200 mg by mouth daily.  Marland Kitchen FLUoxetine (PROZAC) 20 MG capsule TAKE 1 CAPSULE BY MOUTH EVERY DAY  . glucosamine-chondroitin 500-400 MG tablet Take 1 tablet by mouth 2 (two) times daily.   . Multiple Vitamin (MULTIVITAMIN) tablet Take 1 tablet by mouth daily.  . mupirocin ointment (BACTROBAN) 2 % Apply 1 application daily as needed topically.   Marland Kitchen neomycin-polymyxin-hydrocortisone (CORTISPORIN) otic solution Place 3 drops into both ears at bedtime. For 5 days prior to having flushing (Patient taking differently: Place 3 drops into both ears at bedtime as needed. For 5 days prior to having flushing)  . Probiotic Product (PROBIOTIC-10 PO) Take 1 capsule by mouth daily.  . ramipril (ALTACE) 10 MG capsule TAKE 1 CAPSULE(10 MG) BY MOUTH DAILY  . simvastatin (ZOCOR) 20 MG tablet TAKE 1 TABLET BY MOUTH DAILY AT 6 PM   No facility-administered encounter medications on file as of 01/04/2020.    Allergies (verified) Sulfamethoxazole-trimethoprim   History: Past Medical History:  Diagnosis Date  . Adrenal insufficiency ([place])   . Anxiety   . Arthritis   . Atrophic vaginitis 11/08/2013  . BPV (benign positional vertigo) 05/08/2015  . Chicken pox as a child  . Colon polyps    removed during colonscopy  . Diverticulitis   . Diverticulosis   . GERD (gastroesophageal reflux disease)   .  Hyperlipidemia   . Hypertension   . Medicare annual wellness visit, subsequent 05/16/2013  . Osteoporosis, unspecified 03/09/2013  . Preventative health care 11/07/2015  . Skin cancer    behind ear  . Urine incontinence    sometimes  . Verruca 11/07/2015  . Vitamin D deficiency 11/07/2015   Past  Surgical History:  Procedure Laterality Date  . ABDOMINAL HYSTERECTOMY  97   has ovaries  . BREAST BIOPSY Left     FNA  . CATARACT EXTRACTION  L 09/11/10, R 10/10/10  . [place]  . EYE SURGERY Bilateral 10/10/2010, 07/28/17   cataracts, posterior capsulotomy b/l  . TONSILLECTOMY AND ADENOIDECTOMY  1990   Family History  Problem Relation Age of Onset  . Arthritis Mother   . Hyperlipidemia Mother   . Heart disease Mother   . Hypertension Mother   . Colon cancer Father   . Diabetes Sister        type 2  . Heart disease Sister   . Hypertension Sister   . Hyperlipidemia Sister   . Mental illness Maternal Uncle   . Stroke Maternal Grandmother   . Pneumonia Maternal Grandfather   . Cerebral palsy Son        MR  . Colon cancer Cousin    Social History   Socioeconomic History  . Marital status: Married    Spouse name: Not on file  . Number of children: 2  . Years of education: Not on file  . Highest education level: Not on file  Occupational History  . Occupation: retired  Tobacco Use  . Smoking status: Never Smoker  . Smokeless tobacco: Never Used  Substance and Sexual Activity  . Alcohol use: No  . Drug use: No  . Sexual activity: Never    Comment: lives with husband and disabled son, no dietary restrictions, not exercising regularly  Other Topics Concern  . Not on file  Social History Narrative  . Not on file   Social Determinants of Health   Financial Resource Strain: Low Risk   . Difficulty of Paying Living Expenses: Not hard at all  Food Insecurity: No Food Insecurity  . Worried About Charity fundraiser in the Last Year: Never true  . Ran Out of Food in the Last Year: Never true  Transportation Needs: No Transportation Needs  . Lack of Transportation (Medical): No  . Lack of Transportation (Non-Medical): No  Physical Activity:   . Days of Exercise per Week:   . Minutes of Exercise per Session:   Stress:   .  Feeling of Stress :   Social Connections:   . Frequency of Communication with Friends and Family:   . Frequency of Social Gatherings with Friends and Family:   . Attends Religious Services:   . Active Member of Clubs or Organizations:   . Attends Archivist Meetings:   Marland Kitchen Marital Status:     Tobacco Counseling Counseling given: Not Answered   Clinical Intake: Pain : No/denies pain    Activities of Daily Living In your present state of health, do you have any difficulty performing the following activities: 01/04/2020  Hearing? N  Vision? N  Difficulty concentrating or making decisions? N  Walking or climbing stairs? N  Dressing or bathing? N  Doing errands, shopping? N  Preparing Food and eating ? N  Using the Toilet? N  In the past six months, have you accidently leaked urine? N  Do you have problems with loss of bowel control? Y  Managing your Medications? N  Managing your Finances? N  Housekeeping or managing your Housekeeping? N  Some recent data might be hidden    Patient Care Team: Mosie Lukes, MD as PCP - General (Family Medicine) Murvin Donning, MD (Dermatology) Armbruster, Carlota Raspberry, MD as Consulting Physician (Gastroenterology) Bethann Goo, [place] as Referring Physician (Chiropractic Medicine) Linward Natal, MD as Consulting Physician (Ophthalmology)  Indicate any recent Medical Services you may have received from other than [facility] providers in the past year (date may be approximate).     Assessment:   This is a routine wellness examination for Ginger Blue.  Dietary issues and exercise activities discussed: Current Exercise Habits: The patient does not participate in regular exercise at present, Exercise limited by: None identified   Diet (meal preparation, eat out, water intake, caffeinated beverages, dairy products, fruits and vegetables): well balanced     Goals    .  Healthy Lifestyle      Continue to eat heart healthy diet (full of fruits,  vegetables, whole grains, lean protein, water--limit salt, fat, and sugar intake) and increase physical activity as tolerated. Continue doing brain stimulating activities (puzzles, reading, adult coloring books, staying active) to keep memory sharp.      .  Increase physical activity    .  Maintain current health (pt-stated)      Depression Screen PHQ 2/9 Scores 01/04/2020 12/04/2017 07/25/2017 05/09/2016 05/08/2015 10/31/2014 05/16/2013  PHQ - 2 Score 0 0 0 0 0 0 0    Fall Risk Fall Risk  01/04/2020 12/04/2017 07/25/2017 05/09/2016 05/08/2015  Falls in the past year? 0 Yes No No No  Number falls in past yr: 0 1 - - -  Injury with Fall? 0 No - - -  Follow up Education provided;Falls prevention discussed - - - -   Lives w/ husband. 1 story home.  Any stairs in or around the home? No  Home free of loose throw rugs in walkways, pet beds, electrical cords, etc? Yes  Adequate lighting in your home to reduce risk of falls? Yes   ASSISTIVE DEVICES UTILIZED TO PREVENT FALLS:  Life alert? No  Use of a cane, walker or w/c? uses cane only when she goes out if needed. Grab bars in the bathroom? Yes  Shower chair or bench in shower? Yes  Elevated toilet seat or a handicapped toilet? No     Cognitive Function: Ad8 score reviewed for issues:  Issues making decisions:no  Less interest in hobbies / activities:no  Repeats questions, stories (family complaining):no  Trouble using ordinary gadgets (microwave, computer, phone):no  Forgets the month or year: no  Mismanaging finances: no  Remembering appts:no  Daily problems with thinking and/or memory:no Ad8 score is=0     MMSE - Mini Mental State Exam 07/25/2017 05/09/2016  Orientation to time 5 5  Orientation to Place 5 5  Registration 3 3  Attention/ Calculation 5 5  Recall 2 3  Language- name 2 objects 2 2  Language- repeat 1 1  Language- follow 3 step command 3 3  Language- read & follow direction 1 1  Write a sentence 1 1    Copy design 1 1  Total score 29 30        Immunizations Immunization History  Administered Date(s) Administered  . Fluad Quad(high Dose 65+) 05/04/2019  . Influenza, High Dose Seasonal PF 05/09/2016, 05/22/2017, 05/07/2018  . Influenza,inj,Quad PF,6+ Mos 04/13/2013, 04/20/2014,  05/08/2015  . PFIZER SARS-COV-2 Vaccination 08/03/2019, 08/24/2019  . Pneumococcal Conjugate-13 11/12/2013  . Pneumococcal Polysaccharide-23 04/25/2011  . Tdap 01/12/2010  . Zoster 07/16/2011  . Zoster Recombinat (Shingrix) 06/10/2017, 08/08/2017, 09/08/2017    TDAP status: Up to date Flu Vaccine status: Up to date Pneumococcal vaccine status: Up to date Covid-19 vaccine status: Completed vaccines  Qualifies for Shingles Vaccine?   Zostavax completed Yes   Shingrix Completed?: Yes  Screening Tests Health Maintenance  Topic Date Due  . Hepatitis C Screening  Never done  . TETANUS/TDAP  01/13/2020  . INFLUENZA VACCINE  02/13/2020  . DEXA SCAN  Completed  . COVID-19 Vaccine  Completed  . PNA vac Low Risk Adult  Completed    Health Maintenance  Health Maintenance Due  Topic Date Due  . Hepatitis C Screening  Never done    Colorectal cancer screening: Completed 03/16/10. Repeat every "not listed" years Mammogram status: Completed 05/04/19. Repeat every year Bone Density status: Completed 05/22/15. Results reflect: Bone density results: OSTEOPOROSIS. Repeat every 2 years. Pt no longer doing scans --per pt would not take treatment.  Lung Cancer Screening: (Low Dose CT Chest recommended if Age 12-80 years, 30 pack-year currently smoking OR have quit w/in 15years.) does not qualify.     Additional Screening:  Hepatitis C Screening: does not qualify  Vision Screening: Recommended annual ophthalmology exams for early detection of glaucoma and other disorders of the eye. Is the patient up to date with their annual eye exam?  Yes  Who is the provider or what is the name of the office in which the  patient attends annual eye exams? Dr.Katherine Tsamis   Dental Screening: Recommended annual dental exams for proper oral hygiene  Community Resource Referral / Chronic Care Management: CRR required this visit?  No   CCM required this visit?  No      Plan:    Please schedule your next medicare wellness visit with me in 1 yr.  Continue to eat heart healthy diet (full of fruits, vegetables, whole grains, lean protein, water--limit salt, fat, and sugar intake) and increase physical activity as tolerated.  Continue doing brain stimulating activities (puzzles, reading, adult coloring books, staying active) to keep memory sharp.    I have personally reviewed and noted the following in the patient's chart:   . Medical and social history . Use of alcohol, tobacco or illicit drugs  . Current medications and supplements . Functional ability and status . Nutritional status . Physical activity . Advanced directives . List of other physicians . Hospitalizations, surgeries, and ER visits in previous 12 months . Vitals . Screenings to include cognitive, depression, and falls . Referrals and appointments  In addition, I have reviewed and discussed with patient certain preventive protocols, quality metrics, and best practice recommendations. A written personalized care plan for preventive services as well as general preventive health recommendations were provided to patient.   Due to this being a telephonic visit, the after visit summary with patients personalized plan was offered to patient via mail or my-chart.  Patient would like to access on my-chart.   Shela Nevin, [place]   01/04/2020     Nurse Notes: Pt is happy to be back in church in person.

## 2020-01-04 ENCOUNTER — Other Ambulatory Visit: Payer: Self-pay

## 2020-01-04 ENCOUNTER — Ambulatory Visit (INDEPENDENT_AMBULATORY_CARE_PROVIDER_SITE_OTHER): Payer: Medicare Other | Admitting: *Deleted

## 2020-01-04 ENCOUNTER — Encounter: Payer: Self-pay | Admitting: *Deleted

## 2020-01-04 VITALS — BP 129/68 | Wt 137.0 lb

## 2020-01-04 DIAGNOSIS — Z0000 Encounter for general adult medical examination without abnormal findings: Secondary | ICD-10-CM | POA: Diagnosis not present

## 2020-01-04 NOTE — Patient Instructions (Signed)
Please schedule your next medicare wellness visit with me in 1 yr.  Continue to eat heart healthy diet (full of fruits, vegetables, whole grains, lean protein, water--limit salt, fat, and sugar intake) and increase physical activity as tolerated.  Continue doing brain stimulating activities (puzzles, reading, adult coloring books, staying active) to keep memory sharp.   Terri Sandoval , Thank you for taking time to come for your Medicare Wellness Visit. I appreciate your ongoing commitment to your health goals. Please review the following plan we discussed and let me know if I can assist you in the future.   Screening recommendations/referrals: Colorectal cancer screening: Completed 03/16/10. Repeat every "not listed" years Mammogram status: Completed 05/04/19. Repeat every year Bone Density status: Completed 05/22/15. Results reflect: Bone density results: OSTEOPOROSIS. Repeat every 2 years. Pt no longer doing scans --per pt would not take treatment. Recommended yearly ophthalmology/optometry visit for glaucoma screening and checkup Recommended yearly dental visit for hygiene and checkup  Vaccinations: TDAP status: Up to date Flu Vaccine status: Up to date Pneumococcal vaccine status: Up to date Covid-19 vaccine status: Completed vaccines    Preventive Care 78 Years and Older, Female Preventive care refers to lifestyle choices and visits with your health care provider that can promote health and wellness. What does preventive care include?  A yearly physical exam. This is also called an annual well check.  Dental exams once or twice a year.  Routine eye exams. Ask your health care provider how often you should have your eyes checked.  Personal lifestyle choices, including:  Daily care of your teeth and gums.  Regular physical activity.  Eating a healthy diet.  Avoiding tobacco and drug use.  Limiting alcohol use.  Practicing safe sex.  Taking low-dose aspirin every  day.  Taking vitamin and mineral supplements as recommended by your health care provider. What happens during an annual well check? The services and screenings done by your health care provider during your annual well check will depend on your age, overall health, lifestyle risk factors, and family history of disease. Counseling  Your health care provider may ask you questions about your:  Alcohol use.  Tobacco use.  Drug use.  Emotional well-being.  Home and relationship well-being.  Sexual activity.  Eating habits.  History of falls.  Memory and ability to understand (cognition).  Work and work Statistician.  Reproductive health. Screening  You may have the following tests or measurements:  Height, weight, and BMI.  Blood pressure.  Lipid and cholesterol levels. These may be checked every 5 years, or more frequently if you are over 65 years old.  Skin check.  Lung cancer screening. You may have this screening every year starting at age 1 if you have a 30-pack-year history of smoking and currently smoke or have quit within the past 15 years.  Fecal occult blood test (FOBT) of the stool. You may have this test every year starting at age 74.  Flexible sigmoidoscopy or colonoscopy. You may have a sigmoidoscopy every 5 years or a colonoscopy every 10 years starting at age 74.  Hepatitis C blood test.  Hepatitis B blood test.  Sexually transmitted disease (STD) testing.  Diabetes screening. This is done by checking your blood sugar (glucose) after you have not eaten for a while (fasting). You may have this done every 1-3 years.  Bone density scan. This is done to screen for osteoporosis. You may have this done starting at age 25.  Mammogram. This may be done every  1-2 years. Talk to your health care provider about how often you should have regular mammograms. Talk with your health care provider about your test results, treatment options, and if necessary, the need  for more tests. Vaccines  Your health care provider may recommend certain vaccines, such as:  Influenza vaccine. This is recommended every year.  Tetanus, diphtheria, and acellular pertussis (Tdap, Td) vaccine. You may need a Td booster every 10 years.  Zoster vaccine. You may need this after age 70.  Pneumococcal 13-valent conjugate (PCV13) vaccine. One dose is recommended after age 66.  Pneumococcal polysaccharide (PPSV23) vaccine. One dose is recommended after age 52. Talk to your health care provider about which screenings and vaccines you need and how often you need them. This information is not intended to replace advice given to you by your health care provider. Make sure you discuss any questions you have with your health care provider. Document Released: 07/28/2015 Document Revised: 03/20/2016 Document Reviewed: 05/02/2015 Elsevier Interactive Patient Education  2017 [place] Prevention in the Home Falls can cause injuries. They can happen to people of all ages. There are many things you can do to make your home safe and to help prevent falls. What can I do on the outside of my home?  Regularly fix the edges of walkways and driveways and fix any cracks.  Remove anything that might make you trip as you walk through a door, such as a raised step or threshold.  Trim any bushes or trees on the path to your home.  Use bright outdoor lighting.  Clear any walking paths of anything that might make someone trip, such as rocks or tools.  Regularly check to see if handrails are loose or broken. Make sure that both sides of any steps have handrails.  Any raised decks and porches should have guardrails on the edges.  Have any leaves, snow, or ice cleared regularly.  Use sand or salt on walking paths during winter.  Clean up any spills in your garage right away. This includes oil or grease spills. What can I do in the bathroom?  Use night lights.  Install grab bars  by the toilet and in the tub and shower. Do not use towel bars as grab bars.  Use non-skid mats or decals in the tub or shower.  If you need to sit down in the shower, use a plastic, non-slip stool.  Keep the floor dry. Clean up any water that spills on the floor as soon as it happens.  Remove soap buildup in the tub or shower regularly.  Attach bath mats securely with double-sided non-slip rug tape.  Do not have throw rugs and other things on the floor that can make you trip. What can I do in the bedroom?  Use night lights.  Make sure that you have a light by your bed that is easy to reach.  Do not use any sheets or blankets that are too big for your bed. They should not hang down onto the floor.  Have a firm chair that has side arms. You can use this for support while you get dressed.  Do not have throw rugs and other things on the floor that can make you trip. What can I do in the kitchen?  Clean up any spills right away.  Avoid walking on wet floors.  Keep items that you use a lot in easy-to-reach places.  If you need to reach something above you, use a strong  step stool that has a grab bar.  Keep electrical cords out of the way.  Do not use floor polish or wax that makes floors slippery. If you must use wax, use non-skid floor wax.  Do not have throw rugs and other things on the floor that can make you trip. What can I do with my stairs?  Do not leave any items on the stairs.  Make sure that there are handrails on both sides of the stairs and use them. Fix handrails that are broken or loose. Make sure that handrails are as long as the stairways.  Check any carpeting to make sure that it is firmly attached to the stairs. Fix any carpet that is loose or worn.  Avoid having throw rugs at the top or bottom of the stairs. If you do have throw rugs, attach them to the floor with carpet tape.  Make sure that you have a light switch at the top of the stairs and the  bottom of the stairs. If you do not have them, ask someone to add them for you. What else can I do to help prevent falls?  Wear shoes that:  Do not have high heels.  Have rubber bottoms.  Are comfortable and fit you well.  Are closed at the toe. Do not wear sandals.  If you use a stepladder:  Make sure that it is fully opened. Do not climb a closed stepladder.  Make sure that both sides of the stepladder are locked into place.  Ask someone to hold it for you, if possible.  Clearly mark and make sure that you can see:  Any grab bars or handrails.  First and last steps.  Where the edge of each step is.  Use tools that help you move around (mobility aids) if they are needed. These include:  Canes.  Walkers.  Scooters.  Crutches.  Turn on the lights when you go into a dark area. Replace any light bulbs as soon as they burn out.  Set up your furniture so you have a clear path. Avoid moving your furniture around.  If any of your floors are uneven, fix them.  If there are any pets around you, be aware of where they are.  Review your medicines with your doctor. Some medicines can make you feel dizzy. This can increase your chance of falling. Ask your doctor what other things that you can do to help prevent falls. This information is not intended to replace advice given to you by your health care provider. Make sure you discuss any questions you have with your health care provider. Document Released: 04/27/2009 Document Revised: 12/07/2015 Document Reviewed: 08/05/2014 Elsevier Interactive Patient Education  2017 [employer].

## 2020-01-17 ENCOUNTER — Other Ambulatory Visit: Payer: Self-pay | Admitting: Family Medicine

## 2020-02-11 ENCOUNTER — Encounter: Payer: Self-pay | Admitting: Gastroenterology

## 2020-02-11 ENCOUNTER — Ambulatory Visit (INDEPENDENT_AMBULATORY_CARE_PROVIDER_SITE_OTHER): Payer: Medicare Other | Admitting: Gastroenterology

## 2020-02-11 ENCOUNTER — Telehealth: Payer: Self-pay | Admitting: Gastroenterology

## 2020-02-11 VITALS — BP 100/72 | HR 80 | Ht 59.0 in | Wt 138.1 lb

## 2020-02-11 DIAGNOSIS — R194 Change in bowel habit: Secondary | ICD-10-CM | POA: Diagnosis not present

## 2020-02-11 DIAGNOSIS — Z800 Family history of malignant neoplasm of digestive organs: Secondary | ICD-10-CM

## 2020-02-11 MED ORDER — SUTAB 1479-225-188 MG PO TABS: 1.0000 | ORAL_TABLET | Freq: Once | ORAL | 0 refills | Status: AC

## 2020-02-11 MED ORDER — CITRUCEL PO POWD
ORAL | Status: DC
Start: 2020-02-11 — End: 2022-09-09

## 2020-02-11 NOTE — Progress Notes (Signed)
HPI :  79 year old female with a history of hypertension, hyperlipidemia, GERD, here to reestablish care with [place] regarding changes in bowel habits and colon cancer screening, referred by Dr. Vivien Rossetti.  I last saw her in January 2017, we reviewed her history, her father had colon cancer at age 37, her last colonoscopy was in 2011 and did not show any adenomas.  We discussed that given the age at which her father was diagnosed with colon cancer, it was okay to wait 10 years for her next colonoscopy although I want to see her at that time to determine if we need to do that given her age is now 72.  Since I've last seen her she has had some significant changes in her bowel habits.  Since April she has had intermittent loose stools with significant urgency.  She states some days she will have normal bowel habits and other days she can have unpredictably loose stools with urgency and borborygmi.  She has tried going with lactose-free milk which has questionably helped.  She has had a lot of stress in her life in recent months.  Her son is 46 years old who is mentally challenged and lives in a group home, her mother is in a nursing home, her husband had sepsis in 2018 and she takes care of him.  She is asking if stress can be related to her symptoms however she has had the social stressors ongoing for years it sounds like.  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  She denies any blood in her stools.  No pains in her abdomen.  She does pass a lot of gas, has been using a probiotic for a long time, she states it does not really help.  She denies any cardiopulmonary symptoms and is otherwise in good health.  She denies any new medications in relation to her bowel changes.  She has been on Prozac for a while.  She has been vaccinated to Covid.  Last colonoscopy reviewed: Colonoscopy 03/2010 - diverticulosis,  hemorrhoids   Past Medical History:  Diagnosis Date  . Adrenal insufficiency ([place])   . Anxiety   . Arthritis   . Atrophic vaginitis 11/08/2013  . BPV (benign positional vertigo) 05/08/2015  . Chicken pox as a child  . Colon polyps    removed during colonscopy  . Diverticulitis   . Diverticulosis   . GERD (gastroesophageal reflux disease)   . Hyperlipidemia   . Hypertension   . Medicare annual wellness visit, subsequent 05/16/2013  . Osteoporosis, unspecified 03/09/2013  . Preventative health care 11/07/2015  . Skin cancer    behind ear  . Urine incontinence    sometimes  . Verruca 11/07/2015  . Vitamin D deficiency 11/07/2015     Past Surgical History:  Procedure Laterality Date  . ABDOMINAL HYSTERECTOMY  97   has ovaries  . BREAST BIOPSY Left     FNA  . CATARACT EXTRACTION  L 09/11/10, R 10/10/10  . [place]  . EYE SURGERY Bilateral 10/10/2010, 07/28/17   cataracts, posterior capsulotomy b/l  . TONSILLECTOMY AND ADENOIDECTOMY  1990   Family History  Problem Relation Age of Onset  . Arthritis Mother   . Hyperlipidemia Mother   . Heart disease Mother   . Hypertension Mother   . Colon cancer Father   . Diabetes  Sister        type 2  . Heart disease Sister   . Hypertension Sister   . Hyperlipidemia Sister   . Mental illness Maternal Uncle   . Stroke Maternal Grandmother   . Pneumonia Maternal Grandfather   . Cerebral palsy Son        MR  . Colon cancer Cousin    Social History   Tobacco Use  . Smoking status: Never Smoker  . Smokeless tobacco: Never Used  Vaping Use  . Vaping Use: Never used  Substance Use Topics  . Alcohol use: No  . Drug use: No   Current Outpatient Medications  Medication Sig Dispense Refill  . azelastine (ASTELIN) 0.1 % nasal spray Place 2 sprays into both nostrils 2 (two) times daily. Use in each nostril as directed 30 mL 12  . Cholecalciferol (VITAMIN D3) 1000 units CAPS Take by mouth.      . fish oil-omega-3 fatty acids 1000 MG capsule Take 1,200 mg by mouth daily.    Marland Kitchen FLUoxetine (PROZAC) 20 MG capsule TAKE 1 CAPSULE BY MOUTH EVERY DAY 90 capsule 1  . glucosamine-chondroitin 500-400 MG tablet Take 1 tablet by mouth 2 (two) times daily.     . Multiple Vitamin (MULTIVITAMIN) tablet Take 1 tablet by mouth daily.    . mupirocin ointment (BACTROBAN) 2 % Apply 1 application daily as needed topically.  22 g 0  . neomycin-polymyxin-hydrocortisone (CORTISPORIN) otic solution Place 3 drops into both ears at bedtime. For 5 days prior to having flushing (Patient taking differently: Place 3 drops into both ears at bedtime as needed. For 5 days prior to having flushing) 10 mL 1  . Probiotic Product (PROBIOTIC-10 PO) Take 1 capsule by mouth daily.    . ramipril (ALTACE) 10 MG capsule Take 1 capsule (10 mg total) by mouth daily. 90 capsule 1  . simvastatin (ZOCOR) 20 MG tablet TAKE 1 TABLET BY MOUTH DAILY AT 6 PM 90 tablet 1   No current facility-administered medications for this visit.   Allergies  Allergen Reactions  . Sulfamethoxazole-Trimethoprim Other (See Comments)    Very sore, sore in corner of lip     Review of Systems: All systems reviewed and negative except where noted in HPI.   Lab Results  Component Value Date   WBC 5.8 11/02/2019   HGB 13.4 11/02/2019   HCT 40.7 11/02/2019   MCV 88.0 11/02/2019   PLT 214.0 11/02/2019    Lab Results  Component Value Date   CREATININE 0.77 11/02/2019   BUN 14 11/02/2019   NA 137 11/02/2019   K 4.4 11/02/2019   CL 100 11/02/2019   CO2 34 (H) 11/02/2019    Lab Results  Component Value Date   ALT 16 11/02/2019   AST 21 11/02/2019   ALKPHOS 76 11/02/2019   BILITOT 0.4 11/02/2019     Physical Exam: BP 100/72   Pulse 80   Ht 4\' 11"  (1.499 m)   Wt 138 lb 2 oz (62.7 kg)   BMI 27.90 kg/m  Constitutional: Pleasant,well-developed, female in no acute distress. HEENT: Normocephalic and atraumatic. Conjunctivae are normal.  No scleral icterus. Neck supple.  Cardiovascular: Normal rate, regular rhythm.  Pulmonary/chest: Effort normal and breath sounds normal.  Abdominal: Soft, nondistended, nontender.  There are no masses palpable.  Extremities: no edema Lymphadenopathy: No cervical adenopathy noted. Neurological: Alert and oriented to person place and time. Skin: Skin is warm and dry. No rashes noted. Psychiatric: Normal mood and affect.  Behavior is normal.   ASSESSMENT AND PLAN: 79 year old female here for reestablishment of the following issues:  Change in bowel habits / family history of colon cancer - we discussed that we often stop doing routine colon cancer screening with colonoscopy at age 53 for average risk individuals, and her father's history of colon cancer should not influence her too much at this point in her life.  In general we discussed if she wanted to have any further screening exams, however she has had a significant change in her bowel habits in recent months which have persisted, as outlined above.  Given her bowel habit changes we both agreed that a colonoscopy is reasonable to further evaluate this, I am concerned about underlying microscopic colitis, and want to rule that out.  Following discussion of colonoscopy and anesthesia, she wants to proceed.  In the interim recommend she stop the probiotic, could be contributing to her gas and bloating, recommend fiber supplementation, specifically Citrucel, to provide more regularity, hopefully this will be less likely to produce gas.  Further recommendations pending the results. In the interim, if she has to travel or go out and wants to reduce her risk for an episode of incontinence, would recommend using Imodium as needed.  She agreed  [place] Cellar, MD [facility]  CC: Mosie Lukes, MD

## 2020-02-11 NOTE — Patient Instructions (Signed)
If you are age 79 or older, your body mass index should be between 23-30. Your Body mass index is 27.9 kg/m. If this is out of the aforementioned range listed, please consider follow up with your Primary Care Provider.  If you are age 69 or younger, your body mass index should be between 19-25. Your Body mass index is 27.9 kg/m. If this is out of the aformentioned range listed, please consider follow up with your Primary Care Provider.   Stop taking a probiotic.  Take Citrucel, over the counter Daily.  Tale Imodium over the counter as needed.  Thank you for entrusting me with your care and for choosing [facility], Dr. [place] Cellar

## 2020-02-11 NOTE — Telephone Encounter (Signed)
Patient had questions about citrucel.  All questions answered

## 2020-02-11 NOTE — Telephone Encounter (Signed)
Patient calling with questions on symptoms recorded on her file and medication given today.

## 2020-03-14 ENCOUNTER — Encounter: Payer: Self-pay | Admitting: Certified Registered Nurse Anesthetist

## 2020-03-15 ENCOUNTER — Ambulatory Visit (AMBULATORY_SURGERY_CENTER): Payer: Medicare Other | Admitting: Gastroenterology

## 2020-03-15 ENCOUNTER — Other Ambulatory Visit: Payer: Self-pay

## 2020-03-15 ENCOUNTER — Encounter: Payer: Self-pay | Admitting: Gastroenterology

## 2020-03-15 VITALS — BP 141/69 | HR 74 | Temp 97.1°F | Resp 16 | Ht 59.0 in | Wt 138.0 lb

## 2020-03-15 DIAGNOSIS — K219 Gastro-esophageal reflux disease without esophagitis: Secondary | ICD-10-CM | POA: Diagnosis not present

## 2020-03-15 DIAGNOSIS — R194 Change in bowel habit: Secondary | ICD-10-CM | POA: Diagnosis not present

## 2020-03-15 DIAGNOSIS — K5730 Diverticulosis of large intestine without perforation or abscess without bleeding: Secondary | ICD-10-CM | POA: Diagnosis not present

## 2020-03-15 DIAGNOSIS — Z800 Family history of malignant neoplasm of digestive organs: Secondary | ICD-10-CM

## 2020-03-15 DIAGNOSIS — K5520 Angiodysplasia of colon without hemorrhage: Secondary | ICD-10-CM

## 2020-03-15 DIAGNOSIS — K648 Other hemorrhoids: Secondary | ICD-10-CM | POA: Diagnosis not present

## 2020-03-15 DIAGNOSIS — I10 Essential (primary) hypertension: Secondary | ICD-10-CM | POA: Diagnosis not present

## 2020-03-15 DIAGNOSIS — D120 Benign neoplasm of cecum: Secondary | ICD-10-CM | POA: Diagnosis not present

## 2020-03-15 MED ORDER — SODIUM CHLORIDE 0.9 % IV SOLN: 500.0000 mL | INTRAVENOUS | Status: DC

## 2020-03-15 NOTE — Progress Notes (Signed)
Called to room to assist during endoscopic procedure.  Patient ID and intended procedure confirmed with present staff. Received instructions for my participation in the procedure from the performing physician.  

## 2020-03-15 NOTE — Progress Notes (Signed)
1521 Ephedrine 10 mg given IV due to low BP, MD updated.

## 2020-03-15 NOTE — Op Note (Signed)
[facility] Patient Name: Eshal Propps Procedure Date: 03/15/2020 2:49 PM MRN: [id number] Endoscopist: Remo Lipps P. Havery Moros , MD Age: 79 Referring MD:  Date of Birth: 04-27-1941 Gender: Female Account #: [id number] Procedure:                Colonoscopy Indications:              Change in bowel habits - improved with citrucel,                            father with history of colon cancer Medicines:                Monitored Anesthesia Care Procedure:                Pre-Anesthesia Assessment:                           - Prior to the procedure, a History and Physical                            was performed, and patient medications and                            allergies were reviewed. The patient's tolerance of                            previous anesthesia was also reviewed. The risks                            and benefits of the procedure and the sedation                            options and risks were discussed with the patient.                            All questions were answered, and informed consent                            was obtained. Prior Anticoagulants: The patient has                            taken no previous anticoagulant or antiplatelet                            agents. ASA Grade Assessment: II - A patient with                            mild systemic disease. After reviewing the risks                            and benefits, the patient was deemed in                            satisfactory condition to undergo the procedure.  After obtaining informed consent, the colonoscope                            was passed under direct vision. Throughout the                            procedure, the patient's blood pressure, pulse, and                            oxygen saturations were monitored continuously. The                            Colonoscope was introduced through the anus and                            advanced to the the  terminal ileum, with                            identification of the appendiceal orifice and IC                            valve. The colonoscopy was performed without                            difficulty. The patient tolerated the procedure                            well. The quality of the bowel preparation was                            good. The terminal ileum, ileocecal valve,                            appendiceal orifice, and rectum were photographed. Scope In: 3:00:37 PM Scope Out: 3:23:08 PM Scope Withdrawal Time: 0 hours 17 minutes 45 seconds  Total Procedure Duration: 0 hours 22 minutes 31 seconds  Findings:                 The perianal and digital rectal examinations were                            normal.                           The terminal ileum appeared normal.                           A single medium-sized angiodysplastic lesion was                            found in the cecum.                           A 3 mm polyp was found in the cecum. The polyp was  sessile. The polyp was removed with a cold snare.                            Resection and retrieval were complete.                           A few small-mouthed diverticula were found in the                            sigmoid colon.                           Internal hemorrhoids were found during                            retroflexion. The hemorrhoids were small.                           The exam was otherwise without abnormality. NO                            inflammatory changes noted.                           Biopsies for histology were taken with a cold                            forceps from the right colon, left colon and                            transverse colon for evaluation of microscopic                            colitis. Complications:            No immediate complications. Estimated blood loss:                            Minimal. Estimated Blood Loss:     Estimated  blood loss was minimal. Impression:               - The examined portion of the ileum was normal.                           - A single colonic angiodysplastic lesion.                           - One 3 mm polyp in the cecum, removed with a cold                            snare. Resected and retrieved.                           - Diverticulosis in the sigmoid colon.                           -  Internal hemorrhoids.                           - The examination was otherwise normal.                           - Biopsies were taken with a cold forceps from the                            right colon, left colon and transverse colon for                            evaluation of microscopic colitis. Recommendation:           - Patient has a contact number available for                            emergencies. The signs and symptoms of potential                            delayed complications were discussed with the                            patient. Return to normal activities tomorrow.                            Written discharge instructions were provided to the                            patient.                           - Resume previous diet.                           - Continue present medications.                           - Resume citrucel                           - Await pathology results.                           - No further surveillance colonoscopy is warranted Remo Lipps P. Winnifred Dufford, MD 03/15/2020 3:31:57 PM This report has been signed electronically.

## 2020-03-15 NOTE — Progress Notes (Signed)
Report given to PACU, vss 

## 2020-03-15 NOTE — Patient Instructions (Signed)
Information on polyps, diverticulosis and hemorrhoids given to you today.  Await pathology results.  Resume previous diet and medications.  Resume Citrucel.  YOU HAD AN ENDOSCOPIC PROCEDURE TODAY AT [facility]:   Refer to the procedure report that was given to you for any specific questions about what was found during the examination.  If the procedure report does not answer your questions, please call your gastroenterologist to clarify.  If you requested that your care partner not be given the details of your procedure findings, then the procedure report has been included in a sealed envelope for you to review at your convenience later.  YOU SHOULD EXPECT: Some feelings of bloating in the abdomen. Passage of more gas than usual.  Walking can help get rid of the air that was put into your GI tract during the procedure and reduce the bloating. If you had a lower endoscopy (such as a colonoscopy or flexible sigmoidoscopy) you may notice spotting of blood in your stool or on the toilet paper. If you underwent a bowel prep for your procedure, you may not have a normal bowel movement for a few days.  Please Note:  You might notice some irritation and congestion in your nose or some drainage.  This is from the oxygen used during your procedure.  There is no need for concern and it should clear up in a day or so.  SYMPTOMS TO REPORT IMMEDIATELY:   Following lower endoscopy (colonoscopy or flexible sigmoidoscopy):  Excessive amounts of blood in the stool  Significant tenderness or worsening of abdominal pains  Swelling of the abdomen that is new, acute  Fever of 100F or higher   For urgent or emergent issues, a gastroenterologist can be reached at any hour by calling [phone]. Do not use MyChart messaging for urgent concerns.    DIET:  We do recommend a small meal at first, but then you may proceed to your regular diet.  Drink plenty of fluids but you should avoid  alcoholic beverages for 24 hours.  ACTIVITY:  You should plan to take it easy for the rest of today and you should NOT DRIVE or use heavy machinery until tomorrow (because of the sedation medicines used during the test).    FOLLOW UP: Our staff will call the number listed on your records 48-72 hours following your procedure to check on you and address any questions or concerns that you may have regarding the information given to you following your procedure. If we do not reach you, we will leave a message.  We will attempt to reach you two times.  During this call, we will ask if you have developed any symptoms of COVID 19. If you develop any symptoms (ie: fever, flu-like symptoms, shortness of breath, cough etc.) before then, please call [phone].  If you test positive for Covid 19 in the 2 weeks post procedure, please call and report this information to [place].    If any biopsies were taken you will be contacted by phone or by letter within the next 1-3 weeks.  Please call us at [phone] if you have not heard about the biopsies in 3 weeks.    SIGNATURES/CONFIDENTIALITY: You and/or your care partner have signed paperwork which will be entered into your electronic medical record.  These signatures attest to the fact that that the information above on your After Visit Summary has been reviewed and is understood.  Full responsibility of the confidentiality of this discharge information  your care-partner.  ?

## 2020-03-15 NOTE — Progress Notes (Signed)
Vs CW ° °

## 2020-03-17 ENCOUNTER — Telehealth: Payer: Self-pay

## 2020-03-17 ENCOUNTER — Telehealth: Payer: Self-pay | Admitting: *Deleted

## 2020-03-17 NOTE — Telephone Encounter (Signed)
Follow up call made, left message. 

## 2020-03-17 NOTE — Telephone Encounter (Signed)
Second attempt follow up call to pt, lm on vm. 

## 2020-03-30 ENCOUNTER — Telehealth: Payer: Self-pay | Admitting: Family Medicine

## 2020-03-30 NOTE — Progress Notes (Signed)
°  Chronic Care Management   Outreach Note  03/30/2020 Name: Terri Sandoval MRN: [id number] DOB: [birth year]  Referred by: Mosie Lukes, MD Reason for referral : No chief complaint on file.   An unsuccessful telephone outreach was attempted today. The patient was referred to the pharmacist for assistance with care management and care coordination.   Follow Up Plan:   Carley Perdue [employer] Scheduler

## 2020-04-03 ENCOUNTER — Encounter: Payer: Self-pay | Admitting: Family Medicine

## 2020-04-03 ENCOUNTER — Other Ambulatory Visit (HOSPITAL_BASED_OUTPATIENT_CLINIC_OR_DEPARTMENT_OTHER): Payer: Self-pay | Admitting: Family Medicine

## 2020-04-03 ENCOUNTER — Telehealth (INDEPENDENT_AMBULATORY_CARE_PROVIDER_SITE_OTHER): Payer: Medicare Other | Admitting: Family Medicine

## 2020-04-03 ENCOUNTER — Ambulatory Visit (HOSPITAL_BASED_OUTPATIENT_CLINIC_OR_DEPARTMENT_OTHER)
Admission: RE | Admit: 2020-04-03 | Discharge: 2020-04-03 | Disposition: A | Discharge status: Home / Self Care | Payer: Medicare Other | Source: Ambulatory Visit | Attending: Family Medicine | Admitting: Family Medicine

## 2020-04-03 ENCOUNTER — Other Ambulatory Visit: Payer: Self-pay

## 2020-04-03 VITALS — BP 124/73 | HR 75 | Temp 97.8°F | Wt 138.0 lb

## 2020-04-03 DIAGNOSIS — S2232XA Fracture of one rib, left side, initial encounter for closed fracture: Secondary | ICD-10-CM | POA: Diagnosis not present

## 2020-04-03 DIAGNOSIS — S20212A Contusion of left front wall of thorax, initial encounter: Secondary | ICD-10-CM

## 2020-04-03 DIAGNOSIS — Z1231 Encounter for screening mammogram for malignant neoplasm of breast: Secondary | ICD-10-CM

## 2020-04-03 DIAGNOSIS — W182XXA Fall in (into) shower or empty bathtub, initial encounter: Secondary | ICD-10-CM | POA: Insufficient documentation

## 2020-04-03 NOTE — Progress Notes (Addendum)
[place] [facility] [address] [place], [place] [address] [phone]  Date:  04/03/2020   Name:  Terri Sandoval   DOB:  04-Mar-1941   MRN:  [id number]  PCP:  Mosie Lukes, MD    Chief Complaint: Fall (fell saturday when getting up out of bed. Hit left side of ribs, pain with breathing in)   History of Present Illness:  Terri Sandoval is a 79 y.o. very pleasant female patient who presents with the following:  Connected with pt via mychart video-primary patient of my partner Dr. Charlett Blake, I have seen her once previously in 2020.   History of hypertension, hyperlipidemia Patient location is home, provider location is home.  Patient any confirmatory factors, she gives consent for virtual visit today The patient and myself are present on the call Today is Monday- on Saturday she slipped in the shower and fell onto her left side on the edge of the tub The area is still sore, she is taking Tylenol as needed.  No vomiting, she is in no distress would like to make sure there is no fracture  It hurts to take a deep breath, or to yawn Did not hit her head She is otherwise not injured She has been sleeping ok She is eating ok   Patient Active Problem List   Diagnosis Date Noted  . Diarrhea 11/02/2019  . Lip lesion 05/04/2019  . Allergy 11/15/2018  . Fall 12/04/2017  . Hyperglycemia 12/04/2017  . Abnormal results of thyroid function studies 05/26/2017  . Vitamin D deficiency 11/07/2015  . Preventative health care 11/07/2015  . Verruca 11/07/2015  . BPV (benign positional vertigo) 05/08/2015  . Skin cancer 10/31/2014  . Atrophic vaginitis 11/08/2013  . Cerumen impaction 06/11/2013  . Medicare annual wellness visit, subsequent 05/16/2013  . Osteoporosis 03/09/2013  . GERD (gastroesophageal reflux disease)   . Hypertension   . Anxiety   . Hyperlipidemia   . Arthritis   . Chicken pox   . Diverticulosis   . Colon polyps   .  Urine incontinence     Past Medical History:  Diagnosis Date  . Adrenal insufficiency ([place])   . Anxiety   . Arthritis   . Atrophic vaginitis 11/08/2013  . BPV (benign positional vertigo) 05/08/2015  . Cataract   . Chicken pox as a child  . Colon polyps    removed during colonscopy  . Diverticulitis   . Diverticulosis   . GERD (gastroesophageal reflux disease)   . Hyperlipidemia   . Hypertension   . Medicare annual wellness visit, subsequent 05/16/2013  . Osteoporosis, unspecified 03/09/2013  . Preventative health care 11/07/2015  . Skin cancer    behind ear  . Urine incontinence    sometimes  . Verruca 11/07/2015  . Vitamin D deficiency 11/07/2015    Past Surgical History:  Procedure Laterality Date  . ABDOMINAL HYSTERECTOMY  97   has ovaries  . BREAST BIOPSY Left     FNA  . CATARACT EXTRACTION  L 09/11/10, R 10/10/10  . [place]  . EYE SURGERY Bilateral 10/10/2010, 07/28/17   cataracts, posterior capsulotomy b/l  . TONSILLECTOMY AND ADENOIDECTOMY  1990    Social History   Tobacco Use  . Smoking status: Never Smoker  . Smokeless tobacco: Never Used  Vaping Use  . Vaping Use: Never used  Substance Use Topics  . Alcohol use: No  .  Drug use: No    Family History  Problem Relation Age of Onset  . Arthritis Mother   . Hyperlipidemia Mother   . Heart disease Mother   . Hypertension Mother   . Colon cancer Father   . Diabetes Sister        type 2  . Heart disease Sister   . Hypertension Sister   . Hyperlipidemia Sister   . Mental illness Maternal Uncle   . Stroke Maternal Grandmother   . Pneumonia Maternal Grandfather   . Cerebral palsy Son        MR  . Colon cancer Cousin     Allergies  Allergen Reactions  . Sulfamethoxazole-Trimethoprim Other (See Comments)    Very sore, sore in corner of lip    Medication list has been reviewed and updated.  Current Outpatient Medications on File Prior to Visit   Medication Sig Dispense Refill  . azelastine (ASTELIN) 0.1 % nasal spray Place 2 sprays into both nostrils 2 (two) times daily. Use in each nostril as directed 30 mL 12  . Cholecalciferol (VITAMIN D3) 1000 units CAPS Take by mouth.    . fish oil-omega-3 fatty acids 1000 MG capsule Take 1,200 mg by mouth daily.    Marland Kitchen FLUoxetine (PROZAC) 20 MG capsule TAKE 1 CAPSULE BY MOUTH EVERY DAY 90 capsule 1  . glucosamine-chondroitin 500-400 MG tablet Take 1 tablet by mouth 2 (two) times daily.     . methylcellulose (CITRUCEL) oral powder Use as directed Daily    . Multiple Vitamin (MULTIVITAMIN) tablet Take 1 tablet by mouth daily.    . ramipril (ALTACE) 10 MG capsule Take 1 capsule (10 mg total) by mouth daily. 90 capsule 1  . simvastatin (ZOCOR) 20 MG tablet TAKE 1 TABLET BY MOUTH DAILY AT 6 PM 90 tablet 1   No current facility-administered medications on file prior to visit.    Review of Systems:  As per HPI- otherwise negative.   Physical Examination: Vitals:   04/03/20 0839  BP: 124/73  Pulse: 75  Temp: 97.8 F (36.6 C)  SpO2: 96%   Vitals:   04/03/20 0839  Weight: 138 lb (62.6 kg)   Body mass index is 27.87 kg/m. Ideal Body Weight:    Connected with patient via video monitor.  She looks well, in no distress.  No cough, wheezing, shortness of breath is noted.  Vital signs are as above Patient should be the area of concern, it is underneath the left bra line, about the ribs.  No visible bruising appreciated over video  Assessment and Plan: Fall in shower - Plan: DG Ribs Unilateral W/Chest Left  Contusion of ribs, left, initial encounter - Plan: DG Ribs Unilateral W/Chest Left  Virtual visit today for concern of fall in shower and possible rib injury.  I have ordered left rib films to be done at the med center, patient will come in at her convenience today and have x-rays taken. I will be in touch with these reports Continue Tylenol as needed for the time being Video used for  the entirety of visit today  Signed Lamar Blinks, MD Received her rib films reports as below  DG Ribs Unilateral W/Chest Left  Result Date: 04/03/2020 CLINICAL DATA:  Left rib pain after fall. EXAM: LEFT RIBS AND CHEST - 3+ VIEW COMPARISON:  None. FINDINGS: Nondisplaced fracture is seen involving the anterior portion of the left ninth rib. There is no evidence of pneumothorax or pleural effusion. Both lungs are clear. Heart  size and mediastinal contours are within normal limits. IMPRESSION: Nondisplaced left ninth rib fracture. Electronically Signed   By: Marijo Conception M.D.   On: 04/03/2020 10:06   Called patient to discuss- LMOM and also mychart message

## 2020-04-12 DIAGNOSIS — H43393 Other vitreous opacities, bilateral: Secondary | ICD-10-CM | POA: Diagnosis not present

## 2020-04-12 DIAGNOSIS — H02831 Dermatochalasis of right upper eyelid: Secondary | ICD-10-CM | POA: Diagnosis not present

## 2020-04-12 DIAGNOSIS — H52203 Unspecified astigmatism, bilateral: Secondary | ICD-10-CM | POA: Diagnosis not present

## 2020-04-12 DIAGNOSIS — H02834 Dermatochalasis of left upper eyelid: Secondary | ICD-10-CM | POA: Diagnosis not present

## 2020-04-12 DIAGNOSIS — H5202 Hypermetropia, left eye: Secondary | ICD-10-CM | POA: Diagnosis not present

## 2020-04-12 DIAGNOSIS — H524 Presbyopia: Secondary | ICD-10-CM | POA: Diagnosis not present

## 2020-04-12 DIAGNOSIS — Z961 Presence of intraocular lens: Secondary | ICD-10-CM | POA: Diagnosis not present

## 2020-04-12 DIAGNOSIS — H43811 Vitreous degeneration, right eye: Secondary | ICD-10-CM | POA: Diagnosis not present

## 2020-04-12 DIAGNOSIS — H04123 Dry eye syndrome of bilateral lacrimal glands: Secondary | ICD-10-CM | POA: Diagnosis not present

## 2020-04-12 DIAGNOSIS — H0100B Unspecified blepharitis left eye, upper and lower eyelids: Secondary | ICD-10-CM | POA: Diagnosis not present

## 2020-04-12 DIAGNOSIS — H35372 Puckering of macula, left eye: Secondary | ICD-10-CM | POA: Diagnosis not present

## 2020-04-12 DIAGNOSIS — H0100A Unspecified blepharitis right eye, upper and lower eyelids: Secondary | ICD-10-CM | POA: Diagnosis not present

## 2020-04-19 ENCOUNTER — Telehealth: Payer: Self-pay | Admitting: Family Medicine

## 2020-04-19 NOTE — Progress Notes (Signed)
°  Chronic Care Management   Outreach Note  04/19/2020 Name: Terri Sandoval MRN: [id number] DOB: 01-Sep-1940  Referred by: Mosie Lukes, MD Reason for referral : No chief complaint on file.   A second unsuccessful telephone outreach was attempted today. The patient was referred to pharmacist for assistance with care management and care coordination.  Follow Up Plan:   Carley Perdue [employer] Scheduler

## 2020-04-20 ENCOUNTER — Telehealth: Payer: Self-pay | Admitting: Family Medicine

## 2020-04-20 NOTE — Progress Notes (Signed)
  Chronic Care Management   Note  04/20/2020 Name: Terri Sandoval MRN: [id number] DOB: [birth year]  Terri Sandoval is a 79 y.o. year old female who is a primary care patient of Mosie Lukes, MD. I reached out to [name] by phone today in response to a referral sent by Ms. Toney Sang [place]'s PCP, Mosie Lukes, MD.   Ms. Solinger was given information about Chronic Care Management services today including:  1. CCM service includes personalized support from designated clinical staff supervised by her physician, including individualized plan of care and coordination with other care providers 2. 24/7 contact phone numbers for assistance for urgent and routine care needs. 3. Service will only be billed when office clinical staff spend 20 minutes or more in a month to coordinate care. 4. Only one practitioner may furnish and bill the service in a calendar month. 5. The patient may stop CCM services at any time (effective at the end of the month) by phone call to the office staff.   Patient agreed to services and verbal consent obtained.   Follow up plan:   Carley Perdue [employer] Scheduler

## 2020-05-04 ENCOUNTER — Other Ambulatory Visit: Payer: Self-pay

## 2020-05-04 ENCOUNTER — Encounter: Payer: Self-pay | Admitting: Family Medicine

## 2020-05-04 ENCOUNTER — Ambulatory Visit (INDEPENDENT_AMBULATORY_CARE_PROVIDER_SITE_OTHER): Payer: Medicare Other | Admitting: Family Medicine

## 2020-05-04 VITALS — BP 128/68 | HR 74 | Temp 97.1°F | Wt 141.3 lb

## 2020-05-04 DIAGNOSIS — R739 Hyperglycemia, unspecified: Secondary | ICD-10-CM

## 2020-05-04 DIAGNOSIS — E782 Mixed hyperlipidemia: Secondary | ICD-10-CM | POA: Diagnosis not present

## 2020-05-04 DIAGNOSIS — E559 Vitamin D deficiency, unspecified: Secondary | ICD-10-CM

## 2020-05-04 DIAGNOSIS — Z23 Encounter for immunization: Secondary | ICD-10-CM

## 2020-05-04 DIAGNOSIS — M818 Other osteoporosis without current pathological fracture: Secondary | ICD-10-CM

## 2020-05-04 DIAGNOSIS — R197 Diarrhea, unspecified: Secondary | ICD-10-CM | POA: Diagnosis not present

## 2020-05-04 DIAGNOSIS — I10 Essential (primary) hypertension: Secondary | ICD-10-CM

## 2020-05-04 NOTE — Assessment & Plan Note (Signed)
Well controlled, no changes to meds. Encouraged heart healthy diet such as the DASH diet and exercise as tolerated.  °

## 2020-05-04 NOTE — Patient Instructions (Addendum)
Can have the bone scan and MGM together in the medcenter schedule late march or early april Hypertension, Adult High blood pressure (hypertension) is when the force of blood pumping through the arteries is too strong. The arteries are the blood vessels that carry blood from the heart throughout the body. Hypertension forces the heart to work harder to pump blood and may cause arteries to become narrow or stiff. Untreated or uncontrolled hypertension can cause a heart attack, heart failure, a stroke, kidney disease, and other problems. A blood pressure reading consists of a higher number over a lower number. Ideally, your blood pressure should be below 120/80. The first ("top") number is called the systolic pressure. It is a measure of the pressure in your arteries as your heart beats. The second ("bottom") number is called the diastolic pressure. It is a measure of the pressure in your arteries as the heart relaxes. What are the causes? The exact cause of this condition is not known. There are some conditions that result in or are related to high blood pressure. What increases the risk? Some risk factors for high blood pressure are under your control. The following factors may make you more likely to develop this condition:  Smoking.  Having type 2 diabetes mellitus, high cholesterol, or both.  Not getting enough exercise or physical activity.  Being overweight.  Having too much fat, sugar, calories, or salt (sodium) in your diet.  Drinking too much alcohol. Some risk factors for high blood pressure may be difficult or impossible to change. Some of these factors include:  Having chronic kidney disease.  Having a family history of high blood pressure.  Age. Risk increases with age.  Race. You may be at higher risk if you are African American.  Gender. Men are at higher risk than women before age 61. After age 44, women are at higher risk than men.  Having obstructive sleep  apnea.  Stress. What are the signs or symptoms? High blood pressure may not cause symptoms. Very high blood pressure (hypertensive crisis) may cause:  Headache.  Anxiety.  Shortness of breath.  Nosebleed.  Nausea and vomiting.  Vision changes.  Severe chest pain.  Seizures. How is this diagnosed? This condition is diagnosed by measuring your blood pressure while you are seated, with your arm resting on a flat surface, your legs uncrossed, and your feet flat on the floor. The cuff of the blood pressure monitor will be placed directly against the skin of your upper arm at the level of your heart. It should be measured at least twice using the same arm. Certain conditions can cause a difference in blood pressure between your right and left arms. Certain factors can cause blood pressure readings to be lower or higher than normal for a short period of time:  When your blood pressure is higher when you are in a health care provider's office than when you are at home, this is called white coat hypertension. Most people with this condition do not need medicines.  When your blood pressure is higher at home than when you are in a health care provider's office, this is called masked hypertension. Most people with this condition may need medicines to control blood pressure. If you have a high blood pressure reading during one visit or you have normal blood pressure with other risk factors, you may be asked to:  Return on a different day to have your blood pressure checked again.  Monitor your blood pressure at home  for 1 week or longer. If you are diagnosed with hypertension, you may have other blood or imaging tests to help your health care provider understand your overall risk for other conditions. How is this treated? This condition is treated by making healthy lifestyle changes, such as eating healthy foods, exercising more, and reducing your alcohol intake. Your health care provider may  prescribe medicine if lifestyle changes are not enough to get your blood pressure under control, and if:  Your systolic blood pressure is above 130.  Your diastolic blood pressure is above 80. Your personal target blood pressure may vary depending on your medical conditions, your age, and other factors. Follow these instructions at home: Eating and drinking   Eat a diet that is high in fiber and potassium, and low in sodium, added sugar, and fat. An example eating plan is called the DASH (Dietary Approaches to Stop Hypertension) diet. To eat this way: ? Eat plenty of fresh fruits and vegetables. Try to fill one half of your plate at each meal with fruits and vegetables. ? Eat whole grains, such as whole-wheat pasta, brown rice, or whole-grain bread. Fill about one fourth of your plate with whole grains. ? Eat or drink low-fat dairy products, such as skim milk or low-fat yogurt. ? Avoid fatty cuts of meat, processed or cured meats, and poultry with skin. Fill about one fourth of your plate with lean proteins, such as fish, chicken without skin, beans, eggs, or tofu. ? Avoid pre-made and processed foods. These tend to be higher in sodium, added sugar, and fat.  Reduce your daily sodium intake. Most people with hypertension should eat less than 1,500 mg of sodium a day.  Do not drink alcohol if: ? Your health care provider tells you not to drink. ? You are pregnant, may be pregnant, or are planning to become pregnant.  If you drink alcohol: ? Limit how much you use to:  0-1 drink a day for women.  0-2 drinks a day for men. ? Be aware of how much alcohol is in your drink. In the [place] one drink equals one 12 oz bottle of beer (355 mL), one 5 oz glass of wine (148 mL), or one 1 oz glass of hard liquor (44 mL). Lifestyle   Work with your health care provider to maintain a healthy body weight or to lose weight. Ask what an ideal weight is for you.  Get at least 30 minutes of exercise  most days of the week. Activities may include walking, swimming, or biking.  Include exercise to strengthen your muscles (resistance exercise), such as Pilates or lifting weights, as part of your weekly exercise routine. Try to do these types of exercises for 30 minutes at least 3 days a week.  Do not use any products that contain nicotine or tobacco, such as cigarettes, e-cigarettes, and chewing tobacco. If you need help quitting, ask your health care provider.  Monitor your blood pressure at home as told by your health care provider.  Keep all follow-up visits as told by your health care provider. This is important. Medicines  Take over-the-counter and prescription medicines only as told by your health care provider. Follow directions carefully. Blood pressure medicines must be taken as prescribed.  Do not skip doses of blood pressure medicine. Doing this puts you at risk for problems and can make the medicine less effective.  Ask your health care provider about side effects or reactions to medicines that you should watch for. Contact  a health care provider if you:  Think you are having a reaction to a medicine you are taking.  Have headaches that keep coming back (recurring).  Feel dizzy.  Have swelling in your ankles.  Have trouble with your vision. Get help right away if you:  Develop a severe headache or confusion.  Have unusual weakness or numbness.  Feel faint.  Have severe pain in your chest or abdomen.  Vomit repeatedly.  Have trouble breathing. Summary  Hypertension is when the force of blood pumping through your arteries is too strong. If this condition is not controlled, it may put you at risk for serious complications.  Your personal target blood pressure may vary depending on your medical conditions, your age, and other factors. For most people, a normal blood pressure is less than 120/80.  Hypertension is treated with lifestyle changes, medicines, or a  combination of both. Lifestyle changes include losing weight, eating a healthy, low-sodium diet, exercising more, and limiting alcohol. This information is not intended to replace advice given to you by your health care provider. Make sure you discuss any questions you have with your health care provider. Document Revised: 03/11/2018 Document Reviewed: 03/11/2018 Elsevier Patient Education  2020 [employer].

## 2020-05-04 NOTE — Assessment & Plan Note (Signed)
hgba1c acceptable, minimize simple carbs. Increase exercise as tolerated.  

## 2020-05-04 NOTE — Assessment & Plan Note (Signed)
Supplement and monitor 

## 2020-05-07 NOTE — Assessment & Plan Note (Signed)
She has been seen by Gastroenterology and they suggested she start [place] and that has helped her bowel movements become much more formed and controlled

## 2020-05-07 NOTE — Assessment & Plan Note (Signed)
Encouraged to get adequate exercise, calcium and vitamin d intake 

## 2020-05-07 NOTE — Progress Notes (Signed)
Subjective:    Patient ID: Terri Sandoval, female    DOB: 10-Mar-1941, 79 y.o.   MRN: [id number]  No chief complaint on file.   HPI Patient is in today for follow up on chronic medical concerns. No recent febrile illness or hospitalizations. She fell and broke a rib a couple months ago and it is steadily improving. She notes she has seen gastroenterology and they had her start Citracel  And that has cleared up her diarrhea. Denies CP/palp/SOB/HA/congestion/fevers/GI or GU c/o. Taking meds as prescribed  Past Medical History:  Diagnosis Date  . Adrenal insufficiency ([place])   . Anxiety   . Arthritis   . Atrophic vaginitis 11/08/2013  . BPV (benign positional vertigo) 05/08/2015  . Cataract   . Chicken pox as a child  . Colon polyps    removed during colonscopy  . Diverticulitis   . Diverticulosis   . GERD (gastroesophageal reflux disease)   . Hyperlipidemia   . Hypertension   . Medicare annual wellness visit, subsequent 05/16/2013  . Osteoporosis, unspecified 03/09/2013  . Preventative health care 11/07/2015  . Skin cancer    behind ear  . Urine incontinence    sometimes  . [place] 11/07/2015  . Vitamin D deficiency 11/07/2015    Past Surgical History:  Procedure Laterality Date  . ABDOMINAL HYSTERECTOMY  97   has ovaries  . BREAST BIOPSY Left     FNA  . CATARACT EXTRACTION  L 09/11/10, R 10/10/10  . [place]  . EYE SURGERY Bilateral 10/10/2010, 07/28/17   cataracts, posterior capsulotomy b/l  . TONSILLECTOMY AND ADENOIDECTOMY  1990    Family History  Problem Relation Age of Onset  . Arthritis Mother   . Hyperlipidemia Mother   . Heart disease Mother   . Hypertension Mother   . Colon cancer Father   . Diabetes Sister        type 2  . Heart disease Sister   . Hypertension Sister   . Hyperlipidemia Sister   . Mental illness Maternal Uncle   . Stroke Maternal Grandmother   . Pneumonia Maternal Grandfather   . Cerebral palsy  Son        MR  . Colon cancer Cousin     Social History   Socioeconomic History  . Marital status: Married    Spouse name: Not on file  . Number of children: 2  . Years of education: Not on file  . Highest education level: Not on file  Occupational History  . Occupation: retired  Tobacco Use  . Smoking status: Never Smoker  . Smokeless tobacco: Never Used  Vaping Use  . Vaping Use: Never used  Substance and Sexual Activity  . Alcohol use: No  . Drug use: No  . Sexual activity: Never    Comment: lives with husband and disabled son, no dietary restrictions, not exercising regularly  Other Topics Concern  . Not on file  Social History Narrative  . Not on file   Social Determinants of Health   Financial Resource Strain: Low Risk   . Difficulty of Paying Living Expenses: Not hard at all  Food Insecurity: No Food Insecurity  . Worried About Charity fundraiser in the Last Year: Never true  . Ran Out of Food in the Last Year: Never true  Transportation Needs: No Transportation Needs  . Lack of Transportation (Medical): No  . Lack of Transportation (Non-Medical): No  Physical Activity:   .  Days of Exercise per Week: Not on file  . Minutes of Exercise per Session: Not on file  Stress:   . Feeling of Stress : Not on file  Social Connections:   . Frequency of Communication with Friends and Family: Not on file  . Frequency of Social Gatherings with Friends and Family: Not on file  . Attends Religious Services: Not on file  . Active Member of Clubs or Organizations: Not on file  . Attends Archivist Meetings: Not on file  . Marital Status: Not on file  Intimate Partner Violence:   . Fear of Current or Ex-Partner: Not on file  . Emotionally Abused: Not on file  . Physically Abused: Not on file  . Sexually Abused: Not on file    Outpatient Medications Prior to Visit  Medication Sig Dispense Refill  . alclomethasone (ACLOVATE) 0.05 % ointment Apply topically 2  (two) times daily.    Marland Kitchen azelastine (ASTELIN) 0.1 % nasal spray Place 2 sprays into both nostrils 2 (two) times daily. Use in each nostril as directed 30 mL 12  . azelastine (ASTELIN) 0.1 % nasal spray Place into the nose.    . Cholecalciferol (VITAMIN D3) 1000 units CAPS Take by mouth.    . fish oil-omega-3 fatty acids 1000 MG capsule Take 1,200 mg by mouth daily.    Marland Kitchen FLUoxetine (PROZAC) 20 MG capsule TAKE 1 CAPSULE BY MOUTH EVERY DAY 90 capsule 1  . glucosamine-chondroitin 500-400 MG tablet Take 1 tablet by mouth 2 (two) times daily.     . methylcellulose (CITRUCEL) oral powder Use as directed Daily    . methylcellulose (CITRUCEL) oral powder daily.    . Multiple Vitamin (MULTIVITAMIN) tablet Take 1 tablet by mouth daily.    . mupirocin ointment (BACTROBAN) 2 % 1 application 3 (three) times daily.    . ramipril (ALTACE) 10 MG capsule Take 1 capsule (10 mg total) by mouth daily. 90 capsule 1  . simvastatin (ZOCOR) 20 MG tablet TAKE 1 TABLET BY MOUTH DAILY AT 6 PM 90 tablet 1   No facility-administered medications prior to visit.    Allergies  Allergen Reactions  . Sulfamethoxazole-Trimethoprim Other (See Comments)    Very sore, sore in corner of lip    Review of Systems  Constitutional: Negative for fever and malaise/fatigue.  HENT: Negative for congestion.   Eyes: Negative for blurred vision.  Respiratory: Negative for shortness of breath.   Cardiovascular: Negative for chest pain, palpitations and leg swelling.  Gastrointestinal: Negative for abdominal pain, blood in stool and nausea.  Genitourinary: Negative for dysuria and frequency.  Musculoskeletal: Positive for joint pain. Negative for falls.  Skin: Negative for rash.  Neurological: Negative for dizziness, loss of consciousness and headaches.  Endo/Heme/Allergies: Negative for environmental allergies.  Psychiatric/Behavioral: Negative for depression. The patient is not nervous/anxious.        Objective:    Physical  Exam Vitals and nursing note reviewed.  Constitutional:      General: She is not in acute distress.    Appearance: She is well-developed.  HENT:     Head: Normocephalic and atraumatic.     Nose: Nose normal.  Eyes:     General:        Right eye: No discharge.        Left eye: No discharge.  Cardiovascular:     Rate and Rhythm: Normal rate and regular rhythm.     Heart sounds: No murmur heard.   Pulmonary:  Effort: Pulmonary effort is normal.     Breath sounds: Normal breath sounds.  Abdominal:     General: Bowel sounds are normal.     Palpations: Abdomen is soft.     Tenderness: There is no abdominal tenderness.  Musculoskeletal:     Cervical back: Normal range of motion and neck supple.  Skin:    General: Skin is warm and dry.  Neurological:     Mental Status: She is alert and oriented to person, place, and time.     BP 128/68 (BP Location: Right Arm, Patient Position: Sitting, Cuff Size: Normal)   Pulse 74   Temp (!) 97.1 F (36.2 C) (Temporal)   Wt 141 lb 4.8 oz (64.1 kg)   BMI 28.54 kg/m  Wt Readings from Last 3 Encounters:  05/04/20 141 lb 4.8 oz (64.1 kg)  04/03/20 138 lb (62.6 kg)  03/15/20 138 lb (62.6 kg)    Diabetic Foot Exam - Simple   No data filed     Lab Results  Component Value Date   WBC 5.8 11/02/2019   HGB 13.4 11/02/2019   HCT 40.7 11/02/2019   PLT 214.0 11/02/2019   GLUCOSE 84 11/02/2019   CHOL 170 11/02/2019   TRIG 142.0 11/02/2019   HDL 68.90 11/02/2019   LDLCALC 72 11/02/2019   ALT 16 11/02/2019   AST 21 11/02/2019   NA 137 11/02/2019   K 4.4 11/02/2019   CL 100 11/02/2019   CREATININE 0.77 11/02/2019   BUN 14 11/02/2019   CO2 34 (H) 11/02/2019   TSH 3.47 11/02/2019   HGBA1C 6.1 11/02/2019   MICROALBUR <0.7 05/04/2018    Lab Results  Component Value Date   TSH 3.47 11/02/2019   Lab Results  Component Value Date   WBC 5.8 11/02/2019   HGB 13.4 11/02/2019   HCT 40.7 11/02/2019   MCV 88.0 11/02/2019   PLT 214.0  11/02/2019   Lab Results  Component Value Date   NA 137 11/02/2019   K 4.4 11/02/2019   CO2 34 (H) 11/02/2019   GLUCOSE 84 11/02/2019   BUN 14 11/02/2019   CREATININE 0.77 11/02/2019   BILITOT 0.4 11/02/2019   ALKPHOS 76 11/02/2019   AST 21 11/02/2019   ALT 16 11/02/2019   PROT 6.9 11/02/2019   ALBUMIN 4.3 11/02/2019   CALCIUM 9.5 11/02/2019   GFR 72.44 11/02/2019   Lab Results  Component Value Date   CHOL 170 11/02/2019   Lab Results  Component Value Date   HDL 68.90 11/02/2019   Lab Results  Component Value Date   LDLCALC 72 11/02/2019   Lab Results  Component Value Date   TRIG 142.0 11/02/2019   Lab Results  Component Value Date   CHOLHDL 2 11/02/2019   Lab Results  Component Value Date   HGBA1C 6.1 11/02/2019       Assessment & Plan:   Problem List Items Addressed This Visit    Hypertension    Well controlled, no changes to meds. Encouraged heart healthy diet such as the DASH diet and exercise as tolerated.       Relevant Orders   CBC   Comprehensive metabolic panel   TSH   Hyperlipidemia   Relevant Orders   Lipid panel   Osteoporosis    Encouraged to get adequate exercise, calcium and vitamin d intake      Relevant Orders   DG Bone Density   Vitamin D deficiency    Supplement and monitor  Relevant Orders   VITAMIN D 25 Hydroxy (Vit-D Deficiency, Fractures)   Hyperglycemia    hgba1c acceptable, minimize simple carbs. Increase exercise as tolerated.       Relevant Orders   Hemoglobin A1c   Diarrhea    She has been seen by Gastroenterology and they suggested she start [place] and that has helped her bowel movements become much more formed and controlled       Other Visit Diagnoses    Influenza vaccine administered    -  Primary   Relevant Orders   Flu Vaccine QUAD High Dose(Fluad)      I am having Aeralyn E. Oliveri maintain her glucosamine-chondroitin, fish oil-omega-3 fatty acids, multivitamin, Vitamin D3, azelastine,  FLUoxetine, simvastatin, ramipril, Citrucel, mupirocin ointment, alclomethasone, Citrucel, and azelastine.  No orders of the defined types were placed in this encounter.    Penni Homans, MD

## 2020-05-15 NOTE — Addendum Note (Signed)
Addended by: Manuela Schwartz on: 05/15/2020 02:23 PM   Modules accepted: Orders

## 2020-05-16 ENCOUNTER — Other Ambulatory Visit (INDEPENDENT_AMBULATORY_CARE_PROVIDER_SITE_OTHER): Payer: Medicare Other

## 2020-05-16 ENCOUNTER — Other Ambulatory Visit: Payer: Self-pay

## 2020-05-16 ENCOUNTER — Ambulatory Visit (HOSPITAL_BASED_OUTPATIENT_CLINIC_OR_DEPARTMENT_OTHER): Payer: Medicare Other

## 2020-05-16 DIAGNOSIS — I10 Essential (primary) hypertension: Secondary | ICD-10-CM | POA: Diagnosis not present

## 2020-05-16 DIAGNOSIS — R7989 Other specified abnormal findings of blood chemistry: Secondary | ICD-10-CM | POA: Diagnosis not present

## 2020-05-16 DIAGNOSIS — E782 Mixed hyperlipidemia: Secondary | ICD-10-CM

## 2020-05-16 DIAGNOSIS — E559 Vitamin D deficiency, unspecified: Secondary | ICD-10-CM

## 2020-05-16 DIAGNOSIS — R739 Hyperglycemia, unspecified: Secondary | ICD-10-CM

## 2020-05-18 LAB — COMPREHENSIVE METABOLIC PANEL
AG Ratio: 1.6 (calc) (ref 1.0–2.5)
ALT: 17 U/L (ref 6–29)
AST: 21 U/L (ref 10–35)
Albumin: 4.1 g/dL (ref 3.6–5.1)
Alkaline phosphatase (APISO): 69 U/L (ref 37–153)
BUN: 14 mg/dL (ref 7–25)
CO2: 28 mmol/L (ref 20–32)
Calcium: 9 mg/dL (ref 8.6–10.4)
Chloride: 102 mmol/L (ref 98–110)
Creat: 0.84 mg/dL (ref 0.60–0.93)
Globulin: 2.5 g/dL (calc) (ref 1.9–3.7)
Glucose, Bld: 102 mg/dL — ABNORMAL HIGH (ref 65–99)
Potassium: 4.2 mmol/L (ref 3.5–5.3)
Sodium: 139 mmol/L (ref 135–146)
Total Bilirubin: 0.5 mg/dL (ref 0.2–1.2)
Total Protein: 6.6 g/dL (ref 6.1–8.1)

## 2020-05-18 LAB — HEMOGLOBIN A1C
Hgb A1c MFr Bld: 5.7 % of total Hgb — ABNORMAL HIGH (ref <5.7)
Mean Plasma Glucose: 117 (calc)
eAG (mmol/L): 6.5 (calc)

## 2020-05-18 LAB — LIPID PANEL
Cholesterol: 166 mg/dL (ref <200)
HDL: 72 mg/dL (ref >=50)
LDL Cholesterol (Calc): 76 mg/dL (calc)
Non-HDL Cholesterol (Calc): 94 mg/dL (calc) (ref <130)
Total CHOL/HDL Ratio: 2.3 (calc) (ref <5.0)
Triglycerides: 93 mg/dL (ref <150)

## 2020-05-18 LAB — TSH: TSH: 4.54 mIU/L — ABNORMAL HIGH (ref 0.40–4.50)

## 2020-05-18 LAB — CBC
HCT: 39.7 % (ref 35.0–45.0)
Hemoglobin: 12.8 g/dL (ref 11.7–15.5)
MCH: 29.4 pg (ref 27.0–33.0)
MCHC: 32.2 g/dL (ref 32.0–36.0)
MCV: 91.3 fL (ref 80.0–100.0)
MPV: 10.3 fL (ref 7.5–12.5)
Platelets: 238 10*3/uL (ref 140–400)
RBC: 4.35 10*6/uL (ref 3.80–5.10)
RDW: 12.5 % (ref 11.0–15.0)
WBC: 5.4 10*3/uL (ref 3.8–10.8)

## 2020-05-18 LAB — T4, FREE: Free T4: 1 ng/dL (ref 0.8–1.8)

## 2020-05-18 LAB — TEST AUTHORIZATION

## 2020-05-18 LAB — VITAMIN D 25 HYDROXY (VIT D DEFICIENCY, FRACTURES): Vit D, 25-Hydroxy: 57 ng/mL (ref 30–100)

## 2020-05-19 ENCOUNTER — Telehealth: Payer: Self-pay

## 2020-05-19 NOTE — Telephone Encounter (Signed)
-----   Message from Mosie Lukes, MD sent at 05/18/2020  1:11 PM EDT ----- TSH up slightly but free T4 the actual thyroid hormone is normal so no changes will recheck with next blood draw.

## 2020-05-19 NOTE — Telephone Encounter (Signed)
Pt informed of levels and that she will be rechecked in future no current changes

## 2020-05-19 NOTE — Telephone Encounter (Signed)
Attempted to call pt to go over lab result. LVM to call back.

## 2020-05-29 ENCOUNTER — Other Ambulatory Visit (HOSPITAL_BASED_OUTPATIENT_CLINIC_OR_DEPARTMENT_OTHER): Payer: Self-pay | Admitting: Internal Medicine

## 2020-05-29 ENCOUNTER — Ambulatory Visit: Payer: Medicare Other | Attending: Internal Medicine

## 2020-05-29 DIAGNOSIS — Z23 Encounter for immunization: Secondary | ICD-10-CM

## 2020-05-29 NOTE — Progress Notes (Signed)
   Covid-19 Vaccination Clinic  Name:  OLENE GODFREY    MRN: [id number] DOB: [birth year]  05/29/2020  Ms. Wolbert was observed post Covid-19 immunization for 15 minutes without incident. She was provided with Vaccine Information Sheet and instruction to access the V-Safe system.   Ms. Duchemin was instructed to call 911 with any severe reactions post vaccine: Marland Kitchen Difficulty breathing  . Swelling of face and throat  . A fast heartbeat  . A bad rash all over body  . Dizziness and weakness   Immunizations Administered    Name Date Dose VIS Date Route   Pfizer COVID-19 Vaccine 05/29/2020  1:47 PM 0.3 mL 05/03/2020 Intramuscular   Manufacturer: [place]   Lot: [id number]   NDC: [address]-1

## 2020-05-30 MED FILL — PFIZER-BIONTECH COVID-19 VA: 30 | 1 days supply | Qty: 0 | Fill #0

## 2020-06-01 ENCOUNTER — Other Ambulatory Visit: Payer: Self-pay | Admitting: Family Medicine

## 2020-06-15 ENCOUNTER — Other Ambulatory Visit: Payer: Self-pay

## 2020-06-15 DIAGNOSIS — R739 Hyperglycemia, unspecified: Secondary | ICD-10-CM

## 2020-06-15 DIAGNOSIS — I10 Essential (primary) hypertension: Secondary | ICD-10-CM

## 2020-06-26 ENCOUNTER — Ambulatory Visit: Payer: Medicare Other | Admitting: Pharmacist

## 2020-06-26 ENCOUNTER — Other Ambulatory Visit: Payer: Self-pay

## 2020-06-26 DIAGNOSIS — E782 Mixed hyperlipidemia: Secondary | ICD-10-CM

## 2020-06-26 DIAGNOSIS — I10 Essential (primary) hypertension: Secondary | ICD-10-CM

## 2020-06-26 DIAGNOSIS — M818 Other osteoporosis without current pathological fracture: Secondary | ICD-10-CM

## 2020-06-26 DIAGNOSIS — R739 Hyperglycemia, unspecified: Secondary | ICD-10-CM

## 2020-06-26 NOTE — Patient Instructions (Addendum)
Visit Information  Goals Addressed            This Visit's Progress   . Chronic Care Management Pharmacy Care Plan       CARE PLAN ENTRY (see longitudinal plan of care for additional care plan information)  Current Barriers:  . Chronic Disease Management support, education, and care coordination needs related to Hypertension, Hyperlipidemia, Pre-Diabetes, Depression, Osteoporosis, Allergic Rhinitis   Hypertension BP Readings from Last 3 Encounters:  05/04/20 128/68  04/03/20 124/73  03/15/20 (!) 141/69   . Pharmacist Clinical Goal(s): o Over the next 180 days, patient will work with PharmD and providers to maintain BP goal <140/90 . Current regimen:  o Ramipiril 81m daily . Interventions: o Discussed BP goal . Patient self care activities - Over the next 180 days, patient will: o Maintain hypertension medication regimen.   Hyperlipidemia Lab Results  Component Value Date/Time   LDLCALC 76 05/16/2020 08:12 AM   . Pharmacist Clinical Goal(s): o Over the next 180 days, patient will work with PharmD and providers to maintain LDL goal < 100 . Current regimen:  . Simvastatin 273mdaily . Fish Oil 120041maily . Interventions: o Discussed LDL goal . Patient self care activities - Over the next 180 days, patient will: o Maintain cholesterol medication regimen.   Pre-Diabetes Lab Results  Component Value Date/Time   HGBA1C 5.7 (H) 05/16/2020 08:12 AM   HGBA1C 6.1 11/02/2019 11:19 AM   . Pharmacist Clinical Goal(s): o Over the next 180 days, patient will work with PharmD and providers to maintain A1c goal <6.5% . Current regimen:  o Diet and exercise management   . Interventions: o Discussed a1c goal . Patient self care activities - Over the next 180 days, patient will: o Maintain a1c less than 6.5%  Osteoporosis . Pharmacist Clinical Goal(s) o Over the next 180 days, patient will work with PharmD and providers to reduce risk of fracture due to  osteoporosis . Current regimen:   Vitamin D 2000 units daily  Calcium 600m33mice daily . Interventions: o Recommended patient to schedule bone density scan . Patient self care activities - Over the next 180 days, patient will: o Schedule visit for bone density scan  Health Maintenance  . Pharmacist Clinical Goal(s) o Over the next 90 days, patient will work with PharmD and providers to complete health maintenance screenings/vaccinations . Interventions: o Recommended Td booster . Patient self care activities - Over the next 90 days, patient will: o Complete Td booster    Medication management . Pharmacist Clinical Goal(s): o Over the next 180 days, patient will work with PharmD and providers to maintain optimal medication adherence . Current pharmacy: [facility] . Interventions o Comprehensive medication review performed. o Continue current medication management strategy . Patient self care activities - Over the next 180 days, patient will: o Focus on medication adherence by filling and taking medications appropriately  o Take medications as prescribed o Report any questions or concerns to PharmD and/or provider(s)  Initial goal documentation        Terri Sandoval given information about Chronic Care Management services today including:  1. CCM service includes personalized support from designated clinical staff supervised by her physician, including individualized plan of care and coordination with other care providers 2. 24/7 contact phone numbers for assistance for urgent and routine care needs. 3. Standard insurance, coinsurance, copays and deductibles apply for chronic care management only during months in which we provide at least 20 minutes of these  services. Most insurances cover these services at 100%, however patients may be responsible for any copay, coinsurance and/or deductible if applicable. This service may help you avoid the need for more expensive face-to-face  services. 4. Only one practitioner may furnish and bill the service in a calendar month. 5. The patient may stop CCM services at any time (effective at the end of the month) by phone call to the office staff.  Patient agreed to services and verbal consent obtained.   The patient verbalized understanding of instructions, educational materials, and care plan provided today and agreed to receive a mailed copy of patient instructions, educational materials, and care plan.  Telephone follow up appointment with pharmacy team member scheduled for: 12/26/2019  Terri Sandoval, [facility]    Osteoporosis  Osteoporosis happens when your bones get thin and weak. This can cause your bones to break (fracture) more easily. You can do things at home to make your bones stronger. Follow these instructions at home:  Activity  Exercise as told by your doctor. Ask your doctor what activities are safe for you. You should do: ? Exercises that make your muscles work to hold your body weight up (weight-bearing exercises). These include tai chi, yoga, and walking. ? Exercises to make your muscles stronger. One example is lifting weights. Lifestyle  Limit alcohol intake to no more than 1 drink a Terri Sandoval for nonpregnant women and 2 drinks a Terri Sandoval for men. One drink equals 12 oz of beer, 5 oz of wine, or 1 oz of hard liquor.  Do not use any products that have nicotine or tobacco in them. These include cigarettes and e-cigarettes. If you need help quitting, ask your doctor. Preventing falls  Use tools to help you move around (mobility aids) as needed. These include canes, walkers, scooters, and crutches.  Keep rooms well-lit and free of clutter.  Put away things that could make you trip. These include cords and rugs.  Install safety rails on stairs. Install grab bars in bathrooms.  Use rubber mats in slippery areas, like bathrooms.  Wear shoes that: ? Fit you well. ? Support your feet. ? Have closed toes. ? Have rubber  soles or low heels.  Tell your doctor about all of the medicines you are taking. Some medicines can make you more likely to fall. General instructions  Eat plenty of calcium and vitamin D. These nutrients are good for your bones. Good sources of calcium and vitamin D include: ? Some fatty fish, such as salmon and tuna. ? Foods that have calcium and vitamin D added to them (fortified foods). For example, some breakfast cereals are fortified with calcium and vitamin D. ? Egg yolks. ? Cheese. ? Liver.  Take over-the-counter and prescription medicines only as told by your doctor.  Keep all follow-up visits as told by your doctor. This is important. Contact a doctor if:  You have not been tested (screened) for osteoporosis and you are: ? A woman who is age 75 or older. ? A man who is age 42 or older. Get help right away if:  You fall.  You get hurt. Summary  Osteoporosis happens when your bones get thin and weak.  Weak bones can break (fracture) more easily.  Eat plenty of calcium and vitamin D. These nutrients are good for your bones.  Tell your doctor about all of the medicines that you take. This information is not intended to replace advice given to you by your health care provider. Make sure you discuss  any questions you have with your health care provider. Document Revised: 06/13/2017 Document Reviewed: 04/25/2017 Elsevier Patient Education  2020 [employer].

## 2020-06-26 NOTE — Chronic Care Management (AMB) (Signed)
Chronic Care Management Pharmacy  Name: Terri Sandoval  MRN: [id number] DOB: 15-Apr-1941  Chief Complaint/ HPI  Terri Sandoval,  79 y.o. , female presents for their Initial CCM visit with the clinical pharmacist via telephone.  PCP : Mosie Lukes, MD  Their chronic conditions include: Hypertension, Hyperlipidemia, Pre-Diabetes, Depression, Osteoporosis, Allergic Rhinitis  Office Visits: 05/04/20: Visit w/ Dr. Charlett Blake -  Seen by gastro for diarrhea. They recommended citrucel and this has helped patient. Flu vaccine given. TSH elevated, but free T4 WNL, so no med changes noted.  04/03/20: Visit w/ Dr. Lorelei Pont - Fall in shower. Xray ordered; showed fracture of left 9th rib. Continue tylenol as needed.   Consult Visit: 04/12/20: Ophthalmology visit w/ Dr. Deatra Ina - No med changes noted.  03/15/20: Colonoscopy w/ Dr. Havery Moros - Negative for microscopic colitis. Small adenoma removed. Do no recommend further colonoscopy.   02/11/20: Gastro visit w/ Dr. Havery Moros - Loose stools. Concern for father's hx of colon cancer. Decision to proceed with colonoscopy. Recommend patient D/C Probiotic and start citrucel to provide more regularity. Immodium as needed to prevent incontinence.   Medications: Outpatient Encounter Medications as of 06/26/2020  Medication Sig  . alclomethasone (ACLOVATE) 0.05 % ointment Apply topically 2 (two) times daily.  Marland Kitchen azelastine (ASTELIN) 0.1 % nasal spray Place 2 sprays into both nostrils 2 (two) times daily. Use in each nostril as directed  . Cholecalciferol (VITAMIN D3) 50 MCG (2000 UT) capsule Take 1 capsule by mouth daily.  . fish oil-omega-3 fatty acids 1000 MG capsule Take 1,200 mg by mouth daily.  Marland Kitchen FLUoxetine (PROZAC) 20 MG capsule Take 1 capsule (20 mg total) by mouth daily.  Marland Kitchen glucosamine-chondroitin 500-400 MG tablet Take 1 tablet by mouth 2 (two) times daily.   . methylcellulose (CITRUCEL) oral powder Use as directed Daily  . Multiple Vitamin  (MULTIVITAMIN) tablet Take 1 tablet by mouth daily. Vitamin pack from [facility]  . ramipril (ALTACE) 10 MG capsule Take 1 capsule (10 mg total) by mouth daily.  . simvastatin (ZOCOR) 20 MG tablet Take 1 tablet (20 mg total) by mouth daily at 6 PM.  . mupirocin ointment (BACTROBAN) 2 % 1 application 3 (three) times daily.  . [DISCONTINUED] azelastine (ASTELIN) 0.1 % nasal spray Place into the nose.  . [DISCONTINUED] methylcellulose (CITRUCEL) oral powder daily.   No facility-administered encounter medications on file as of 06/26/2020.   SDOH Screenings   Alcohol Screen: Not on file  Depression (PHQ2-9): Low Risk   . PHQ-2 Score: 3  Financial Resource Strain: Low Risk   . Difficulty of Paying Living Expenses: Not hard at all  Food Insecurity: No Food Insecurity  . Worried About Charity fundraiser in the Last Year: Never true  . Ran Out of Food in the Last Year: Never true  Housing: Low Risk   . Last Housing Risk Score: 0  Physical Activity: Not on file  Social Connections: Not on file  Stress: Not on file  Tobacco Use: Low Risk   . Smoking Tobacco Use: Never Smoker  . Smokeless Tobacco Use: Never Used  Transportation Needs: No Transportation Needs  . Lack of Transportation (Medical): No  . Lack of Transportation (Non-Medical): No    Current Diagnosis/Assessment:  Goals Addressed            This Visit's Progress   . Chronic Care Management Pharmacy Care Plan       CARE PLAN ENTRY (see longitudinal plan of care for additional care  plan information)  Current Barriers:  . Chronic Disease Management support, education, and care coordination needs related to Hypertension, Hyperlipidemia, Pre-Diabetes, Depression, Osteoporosis, Allergic Rhinitis   Hypertension BP Readings from Last 3 Encounters:  05/04/20 128/68  04/03/20 124/73  03/15/20 (!) 141/69   . Pharmacist Clinical Goal(s): o Over the next 180 days, patient will work with PharmD and providers to maintain BP goal  <140/90 . Current regimen:  o Ramipiril 85m daily . Interventions: o Discussed BP goal . Patient self care activities - Over the next 180 days, patient will: o Maintain hypertension medication regimen.   Hyperlipidemia Lab Results  Component Value Date/Time   LDLCALC 76 05/16/2020 08:12 AM   . Pharmacist Clinical Goal(s): o Over the next 180 days, patient will work with PharmD and providers to maintain LDL goal < 100 . Current regimen:  . Simvastatin 246mdaily . Fish Oil 120023maily . Interventions: o Discussed LDL goal . Patient self care activities - Over the next 180 days, patient will: o Maintain cholesterol medication regimen.   Pre-Diabetes Lab Results  Component Value Date/Time   HGBA1C 5.7 (H) 05/16/2020 08:12 AM   HGBA1C 6.1 11/02/2019 11:19 AM   . Pharmacist Clinical Goal(s): o Over the next 180 days, patient will work with PharmD and providers to maintain A1c goal <6.5% . Current regimen:  o Diet and exercise management   . Interventions: o Discussed a1c goal . Patient self care activities - Over the next 180 days, patient will: o Maintain a1c less than 6.5%  Osteoporosis . Pharmacist Clinical Goal(s) o Over the next 180 days, patient will work with PharmD and providers to reduce risk of fracture due to osteoporosis . Current regimen:   Vitamin D 2000 units daily  Calcium 600m32mice daily . Interventions: o Recommended patient to schedule bone density scan . Patient self care activities - Over the next 180 days, patient will: o Schedule visit for bone density scan  Health Maintenance  . Pharmacist Clinical Goal(s) o Over the next 90 days, patient will work with PharmD and providers to complete health maintenance screenings/vaccinations . Interventions: o Recommended Td booster . Patient self care activities - Over the next 90 days, patient will: o Complete Td booster    Medication management . Pharmacist Clinical Goal(s): o Over the next 180  days, patient will work with PharmD and providers to maintain optimal medication adherence . Current pharmacy: [facility] . Interventions o Comprehensive medication review performed. o Continue current medication management strategy . 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 Took care of him for 50 years, and now he is in a group home since 2015.  Oldest son works in [place] [employer] of social services.  Two grand daughters. She also had to take care of her mother and states her husband has health problems. Feels she has lots of support with the community she lives in.  Hypertension   BP goal is:  <140/90  Office blood pressures are  BP Readings from Last 3 Encounters:  05/04/20 128/68  04/03/20 124/73  03/15/20 (!) 141/69   Patient checks BP at home infrequently Patient home  BP readings are ranging: Unable to assess  Patient has failed these meds in the past: None noted  Patient is currently controlled on the following medications:  . Ramipiril 18m daily  We discussed BP goal  Plan -Continue current medications     Hyperlipidemia   LDL goal < 100  Last lipids Lab Results  Component Value Date   CHOL 166 05/16/2020   HDL 72 05/16/2020   LDLCALC 76 05/16/2020   TRIG 93 05/16/2020   CHOLHDL 2.3 05/16/2020   Hepatic Function Latest Ref Rng & Units 05/16/2020 11/02/2019 05/04/2019  Total Protein 6.1 - 8.1 g/dL 6.6 6.9 6.8  Albumin 3.5 - 5.2 g/dL - 4.3 3.9  AST 10 - 35 U/L 21 21 20   ALT 6 - 29 U/L 17 16 17   Alk Phosphatase 39 - 117 U/L - 76 74  Total Bilirubin 0.2 - 1.2 mg/dL 0.5 0.4 0.3  Bilirubin,  Direct 0.0 - 0.3 mg/dL - - -     The 10-year ASCVD risk score (Mikey BussingDC Jr., et al., 2013) is: 29.3%   Values used to calculate the score:     Age: [age]years     Sex: Female     Is Non-Hispanic African American: No     Diabetic: No     Tobacco smoker: No     Systolic Blood Pressure: 1569mmHg     Is BP treated: Yes     HDL Cholesterol: 72 mg/dL     Total Cholesterol: 166 mg/dL   Patient has failed these meds in past: None noted  Patient is currently controlled on the following medications:  . Simvastatin 276mdaily . Fish Oil 120040maily  We discussed:  LDL goal  Plan -Continue current medications  Pre-Diabetes   A1c goal <6.5%  Recent Relevant Labs: Lab Results  Component Value Date/Time   HGBA1C 5.7 (H) 05/16/2020 08:12 AM   HGBA1C 6.1 11/02/2019 11:19 AM   GFR 72.44 11/02/2019 11:19 AM   GFR 82.32 05/04/2019 11:14 AM   MICROALBUR <0.7 05/04/2018 08:51 AM    Patient has failed these meds in past: None noted  Patient is currently controlled and trending down on the following medications: . None  Realizes she needs to exercise more.  [facility] doesn't feel she's as disciplined as her husband to go. Feels she has gained weight since moving to [place].  Places her sweets in the freezer to help refrain from eating them constantly. She is hesitant to set a goal. "I will do my best".  We discussed: A1c goal  Plan -Continue control with diet and exercise   Depression   [facility] 06/26/2020 01/04/2020 12/04/2017  Decreased Interest 0 0 0  Down, Depressed, Hopeless - 0 0  PHQ - 2 Score 0 0 0  Altered sleeping 0 - -  Tired, decreased energy 1 - -  Change in appetite 0 - -  Feeling bad or failure about yourself  0 - -  Trouble concentrating 1 - -  Moving slowly or fidgety/restless 1 - -  Suicidal thoughts 0 - -  PHQ-9 Score 3 - -    Patient has failed these meds in past: None noted  Patient is currently controlled on the following  medications:  . Fluoxetine 24m32mily  States she has taken this for years. Still feels she has a level of stress, but doing well with current regimen.  She has considered stopping, but she is ok  to staying on regimen.  Plan -Continue current medications  Osteoporosis   Last DEXA Scan: 05/22/2015  T-Score femoral neck: -2.6 (L)   T-Score lumbar spine: -1.2  Vit D, 25-Hydroxy  Date Value Ref Range Status  05/16/2020 57 30 - 100 ng/mL Final    Comment:    Vitamin D Status         25-OH Vitamin D: . Deficiency:                    <20 ng/mL Insufficiency:             20 - 29 ng/mL Optimal:                 > or = 30 ng/mL . For 25-OH Vitamin D testing on patients on  D2-supplementation and patients for whom quantitation  of D2 and D3 fractions is required, the Quest[facility]ureD(TM) 25-OH VIT D, (D2,D3), LC/MS/MS is recommended: order  code [phone] (patients >48yr). See Note 1 . Note 1 . For additional information, please refer to  http://education.Quest[facility].com/faq/FAQ199  (This link is being provided for informational/ educational purposes only.)      Patient is a candidate for pharmacologic treatment due to T-Score < -2.5 in femoral neck  Patient has failed these meds in past: alendronate ("did not do well") Patient is currently uncontrolled on the following medications:   Vitamin D 2000 units daily  Calcium 6069mtwice daily  Taking calcium? Yes in multivitamin pack (120070may taking 1 tablet twice daily) States she will schedule bone density next year with her mammogram Took fosamax for 12 years, then stopped August 2011 (Broke pelvis and wrist October 2008). Drug Holiday? Interested in [employer] due to what she read about side effects. She is open to continuing discussion after having fractured her rib  We discussed:  Recommend weight-bearing and muscle strengthening exercises for building and maintaining bone density. Recommended patient to schedule  bone density scan  Plan -Continue current medications  -Schedule bone density scan  Allergic Rhinitis    Patient has failed these meds in past: None noted  Patient is currently controlled on the following medications: . Azelastine 0.1% 2 sprays each nostril twice daily  Mainly used OTC saline vs azelastine.  Will also use OTC fluticasone if needed.   Plan -Continue current medications  Vaccines   Reviewed and discussed patient's vaccination history.    Immunization History  Administered Date(s) Administered  . Fluad Quad(high Dose 65+) 05/04/2019  . Influenza, High Dose Seasonal PF 05/09/2016, 05/22/2017, 05/07/2018  . Influenza,inj,Quad PF,6+ Mos 04/13/2013, 04/20/2014, 05/08/2015  . Influenza-Unspecified 05/04/2020  . PFIZER SARS-COV-2 Vaccination 08/03/2019, 08/24/2019, 05/29/2020  . Pneumococcal Conjugate-13 11/12/2013  . Pneumococcal Polysaccharide-23 04/25/2011  . Tdap 01/12/2010  . Zoster 07/16/2011  . Zoster Recombinat (Shingrix) 06/10/2017, 08/08/2017, 09/08/2017    Plan -Recommended patient receive Td vaccine in pharmacy.   Medication Management   [id number] [place] - [id number]: [phone]: [phone] pill box? Yes Pt endorses 100% compliance  Miscellaneous Meds Aclomethasone 0.05% ointment - from derm for lip lesion, uses prn Aquaphor healing ointment - nightly Glucosamine-Chondroitin 1500-400m73mtwice daily Citrucel powder - Pt states this is a "miracle drug" recommended per gastro; using every other night Multivitamin - packets from [facility] 2% ointment - for swollen cuticle per derm (  Dr. Susie Cassette)  Plan  Continue current medication management strategy   Follow up:  6 month phone visit  Melvenia Beam Zebulin Siegel, PharmD, BCACP Clinical Pharmacist [facility] [phone]

## 2020-07-21 IMAGING — MG DIGITAL SCREENING BILATERAL MAMMOGRAM WITH TOMO AND CAD
6 of 10 series · 6 of 30 positions shown · non-contrast
Comparison: Previous exam(s).

CLINICAL DATA: Screening.

EXAM:
DIGITAL SCREENING BILATERAL MAMMOGRAM WITH TOMO AND CAD

[L MLO synth-2D]
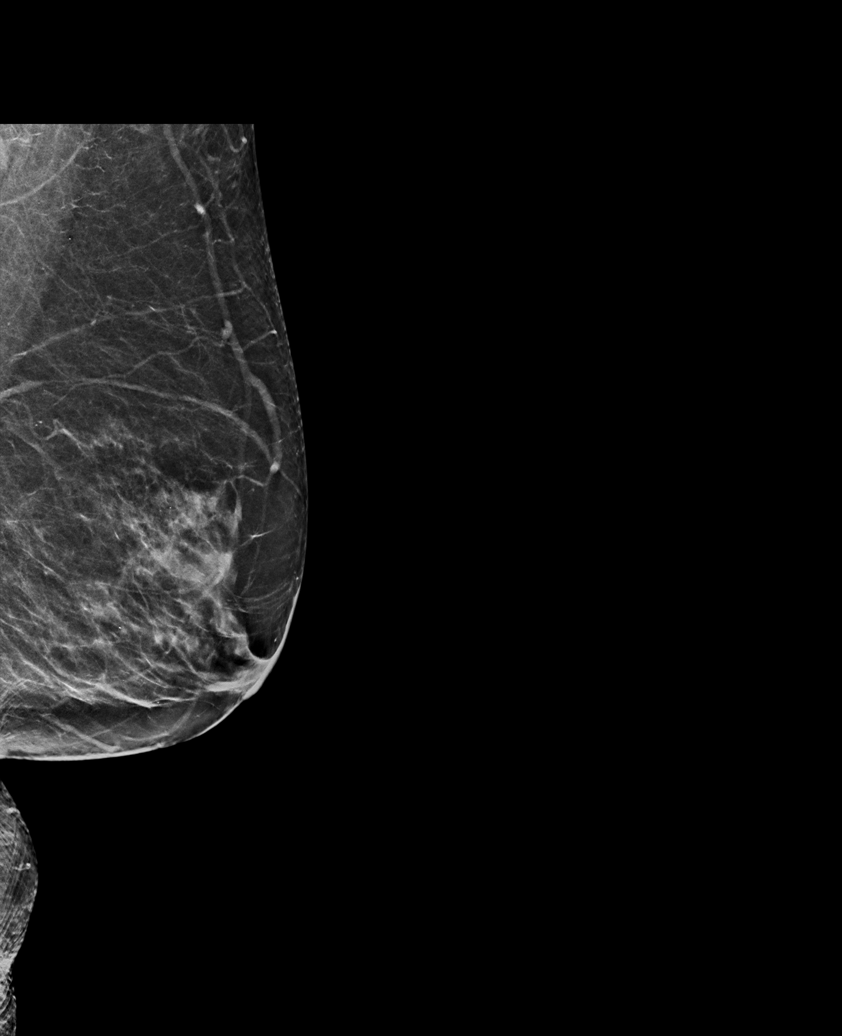

[R MLO synth-2D (1 of 2)]
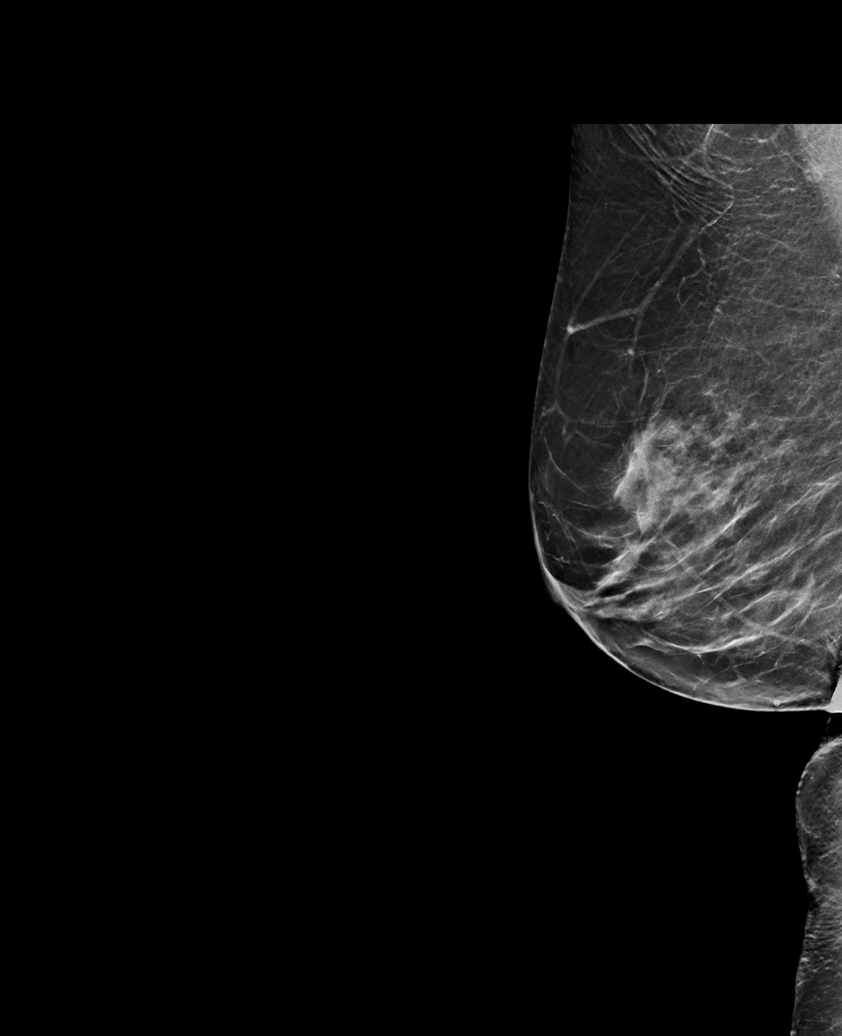

[L CC synth-2D]
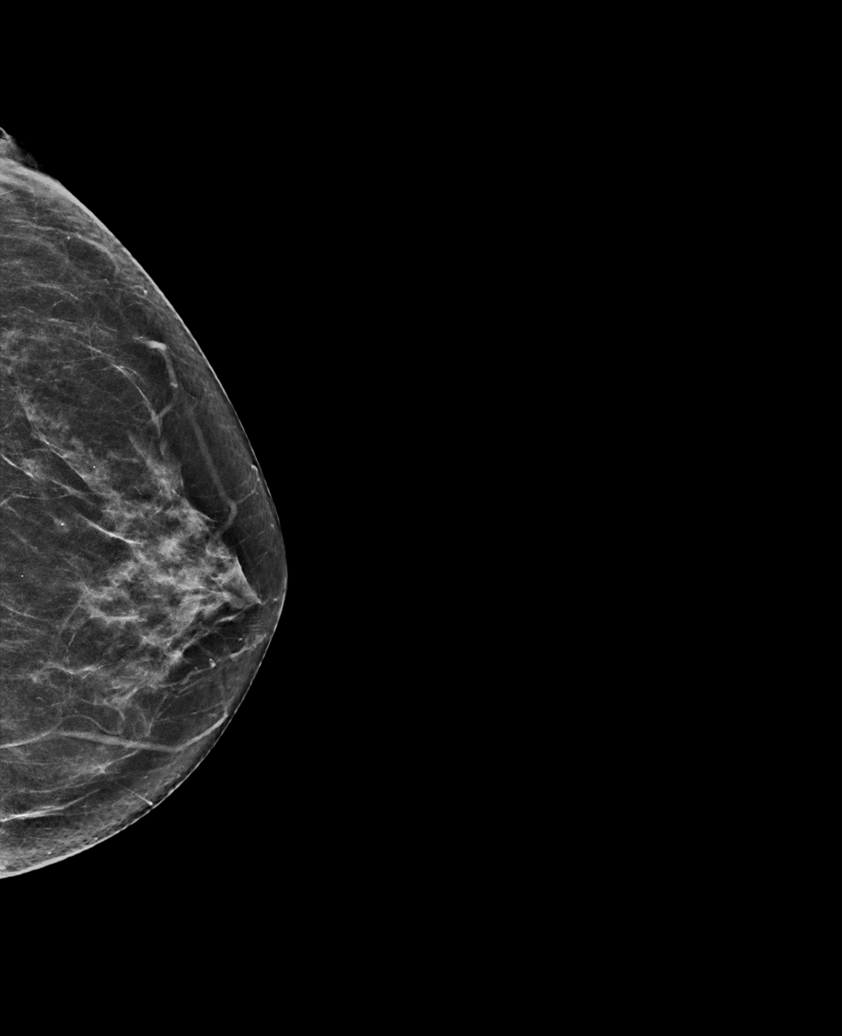

[R MLO synth-2D (2 of 2)]
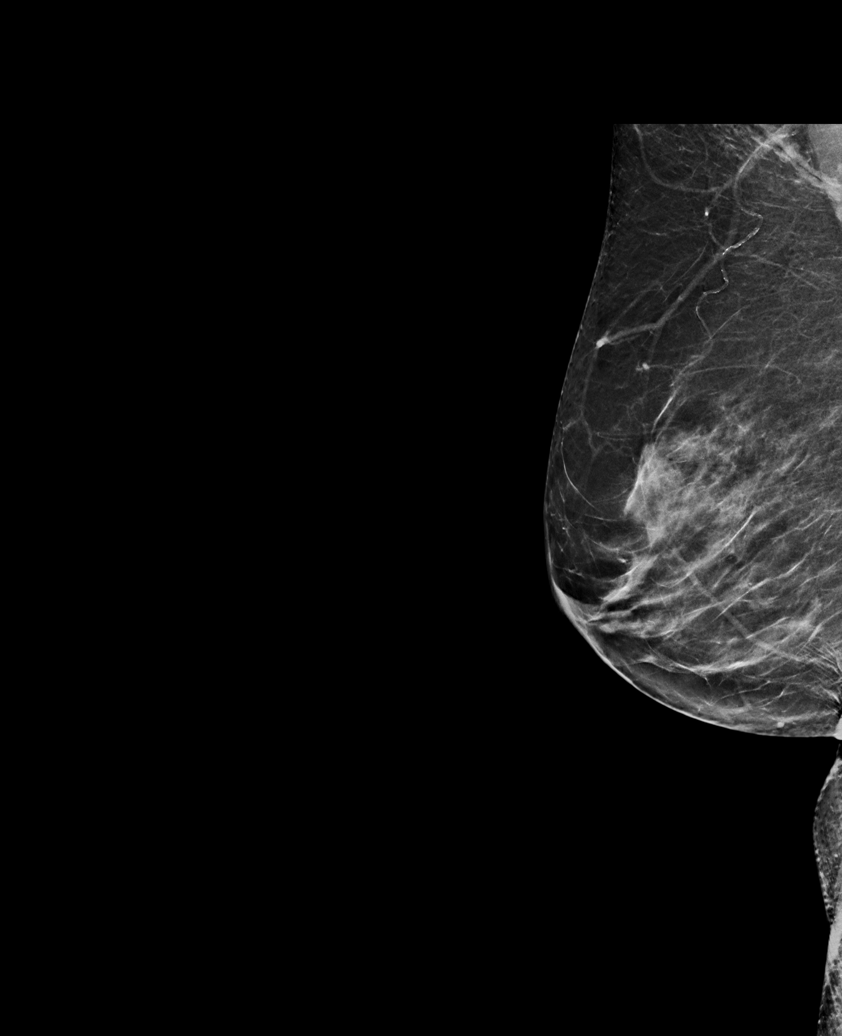

[R CC synth-2D]
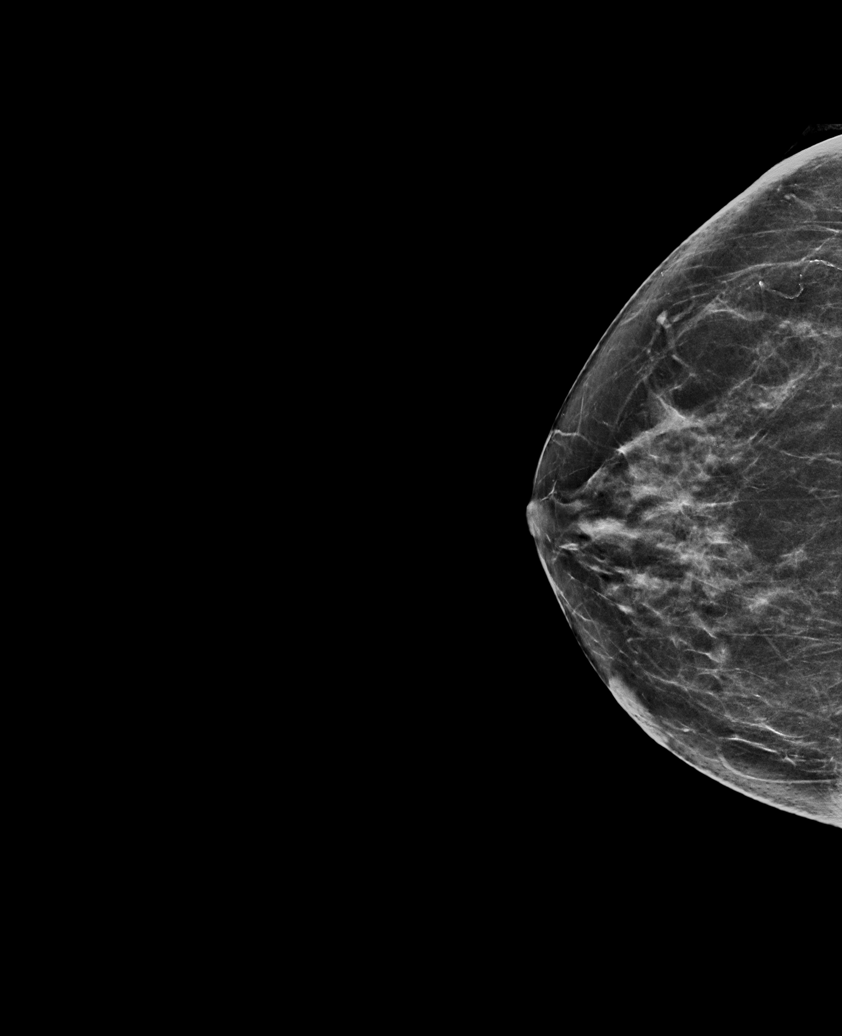

[R MLO tomo · tomo slice 35/69.0]
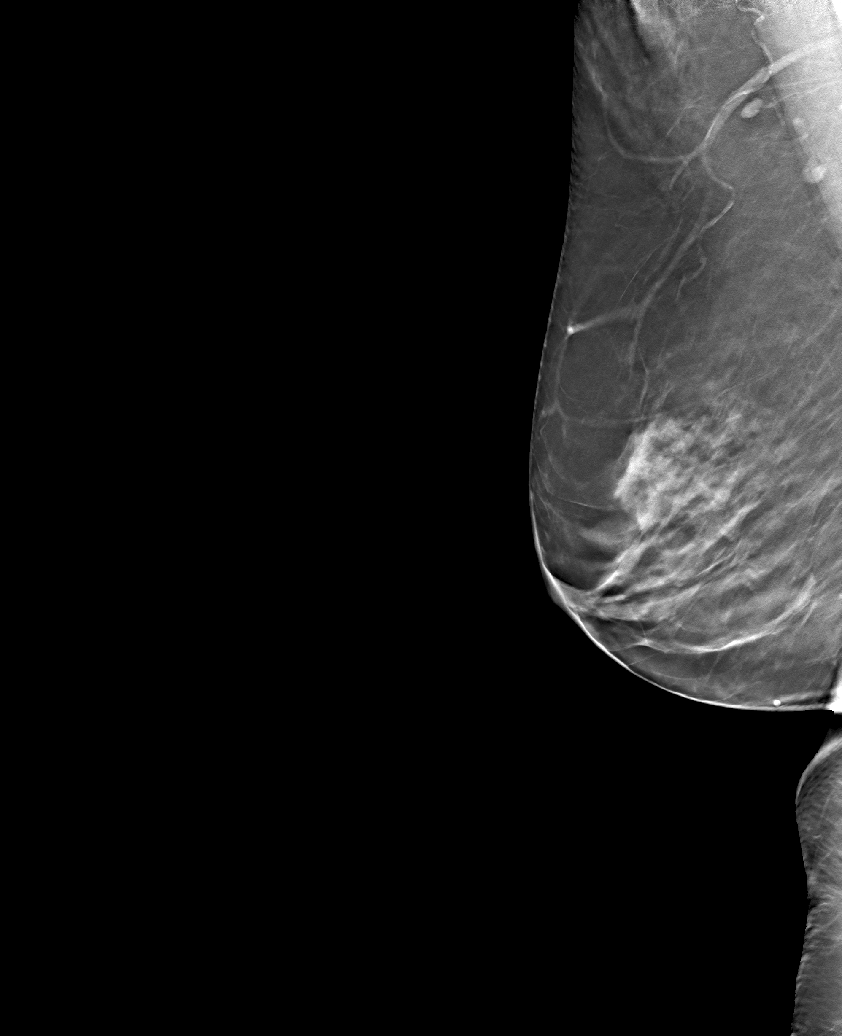

[6 of 30 positions shown; findings below may reference images not displayed]

ACR Breast Density Category b: There are scattered areas of
fibroglandular density.
FINDINGS: There are no findings suspicious for malignancy. Images were
processed with CAD.
IMPRESSION: No mammographic evidence of malignancy. A result letter of this
screening mammogram will be mailed directly to the patient.

RECOMMENDATION:
Screening mammogram in one year. (Code:[id number])

BI-RADS CATEGORY  1: Negative.

## 2020-09-20 ENCOUNTER — Telehealth: Payer: Self-pay | Admitting: Pharmacist

## 2020-09-20 NOTE — Progress Notes (Addendum)
Chronic Care Management Pharmacy Assistant   Name: Terri Sandoval  MRN: [id number] DOB: July 04, 1941  Reason for Encounter: Disease State For CHL   Conditions to be addressed/monitored: Hypertension, Hyperlipidemia, Pre-Diabetes, Depression, Osteoporosis, Allergic Rhinitis  Recent office visits: None since 06/26/20  Recent consult visits: None since 06/26/20  Hospital visits:  None in previous 6 months  Medications: Outpatient Encounter Medications as of 09/20/2020  Medication Sig   alclomethasone (ACLOVATE) 0.05 % ointment Apply topically 2 (two) times daily.   azelastine (ASTELIN) 0.1 % nasal spray Place 2 sprays into both nostrils 2 (two) times daily. Use in each nostril as directed   Cholecalciferol (VITAMIN D3) 50 MCG (2000 UT) capsule Take 1 capsule by mouth daily.   fish oil-omega-3 fatty acids 1000 MG capsule Take 1,200 mg by mouth daily.   FLUoxetine (PROZAC) 20 MG capsule Take 1 capsule (20 mg total) by mouth daily.   glucosamine-chondroitin 500-400 MG tablet Take 1 tablet by mouth 2 (two) times daily.    methylcellulose (CITRUCEL) oral powder Use as directed Daily   Multiple Vitamin (MULTIVITAMIN) tablet Take 1 tablet by mouth daily. Vitamin pack from [facility]   mupirocin ointment (BACTROBAN) 2 % 1 application 3 (three) times daily.   ramipril (ALTACE) 10 MG capsule Take 1 capsule (10 mg total) by mouth daily.   simvastatin (ZOCOR) 20 MG tablet Take 1 tablet (20 mg total) by mouth daily at 6 PM.   No facility-administered encounter medications on file as of 09/20/2020.   09/20/2020 Name: Terri Sandoval MRN: [id number] DOB: 1941/01/03 Terri Sandoval is a 80 y.o. year old female who is a primary care patient of Mosie Lukes, MD.  Comprehensive medication review performed; Spoke to patient regarding cholesterol  Lipid Panel    Component Value Date/Time   CHOL 166 05/16/2020 0812   TRIG 93 05/16/2020 0812   HDL 72 05/16/2020 0812   LDLCALC 76 05/16/2020 0812    10-year  ASCVD risk score: The 10-year ASCVD risk score Mikey Bussing DC Brooke Bonito., et al., 2013) is: 32.7%   Values used to calculate the score:     Age: 80 years     Sex: Female     Is Non-Hispanic African American: No     Diabetic: No     Tobacco smoker: No     Systolic Blood Pressure: 093 mmHg     Is BP treated: Yes     HDL Cholesterol: 72 mg/dL     Total Cholesterol: 166 mg/dL  Current antihyperlipidemic regimen:  Simvastatin 20 mg daily   Previous antihyperlipidemic medications tried: None noted.  ASCVD risk enhancing conditions: age >49 and HTN Pre DM.   What recent interventions/DTPs have been made by any provider to improve Cholesterol control since last CPP Visit: None.   Any recent hospitalizations or ED visits since last visit with CPP? Patent stated no.  What diet changes have been made to improve Cholesterol?  Patient stated she eats fruit daily. She stated she is working on eating more vegetables. Patient stated she eats out on sundays as well as fast food restaurants some times. Patient stated she drinks water off and on all day and ice tea for dinner (home-made).  What exercise is being done to improve Cholesterol?  Patient stated she does things around the house , but she doesn't do any regular exercises. She stated she's had a few falls and is afraid of walking outside.   Adherence Review: Does the patient have >5 day  gap between last estimated fill dates? Per Misc Rpts Yes.  Star Rating Drugs: Ramipril 10 mg 04/20/20 90 DS and Simvastatin 20 mg 04/05/20 90 DS.  Patient stated she does not have any questions or concerns about her medications at this time.   Follow-UP: Pharmacist Review  Charlann Lange, RMA Clinical Pharmacist Assistant [phone]  10 minutes spent in review, coordination, and documentation.  Reviewed by: Beverly Milch, PharmD Clinical Pharmacist [facility] [phone]

## 2020-09-21 ENCOUNTER — Encounter: Payer: Self-pay | Admitting: Family Medicine

## 2020-10-02 ENCOUNTER — Ambulatory Visit (HOSPITAL_BASED_OUTPATIENT_CLINIC_OR_DEPARTMENT_OTHER)
Admission: RE | Admit: 2020-10-02 | Discharge: 2020-10-02 | Disposition: A | Discharge status: Home / Self Care | Payer: Medicare Other | Source: Ambulatory Visit | Attending: Family Medicine | Admitting: Family Medicine

## 2020-10-02 ENCOUNTER — Encounter (HOSPITAL_BASED_OUTPATIENT_CLINIC_OR_DEPARTMENT_OTHER): Payer: Self-pay

## 2020-10-02 ENCOUNTER — Other Ambulatory Visit: Payer: Self-pay

## 2020-10-02 DIAGNOSIS — M818 Other osteoporosis without current pathological fracture: Secondary | ICD-10-CM | POA: Diagnosis not present

## 2020-10-02 DIAGNOSIS — M810 Age-related osteoporosis without current pathological fracture: Secondary | ICD-10-CM | POA: Diagnosis not present

## 2020-10-02 DIAGNOSIS — Z1231 Encounter for screening mammogram for malignant neoplasm of breast: Secondary | ICD-10-CM | POA: Diagnosis not present

## 2020-11-02 ENCOUNTER — Encounter: Payer: Self-pay | Admitting: Family Medicine

## 2020-11-02 ENCOUNTER — Ambulatory Visit (INDEPENDENT_AMBULATORY_CARE_PROVIDER_SITE_OTHER): Payer: Medicare Other | Admitting: Family Medicine

## 2020-11-02 ENCOUNTER — Other Ambulatory Visit: Payer: Self-pay

## 2020-11-02 VITALS — BP 106/70 | HR 72 | Temp 98.0°F | Resp 16 | Wt 141.6 lb

## 2020-11-02 DIAGNOSIS — I10 Essential (primary) hypertension: Secondary | ICD-10-CM | POA: Diagnosis not present

## 2020-11-02 DIAGNOSIS — M533 Sacrococcygeal disorders, not elsewhere classified: Secondary | ICD-10-CM

## 2020-11-02 DIAGNOSIS — R7989 Other specified abnormal findings of blood chemistry: Secondary | ICD-10-CM | POA: Diagnosis not present

## 2020-11-02 DIAGNOSIS — G8929 Other chronic pain: Secondary | ICD-10-CM

## 2020-11-02 DIAGNOSIS — E782 Mixed hyperlipidemia: Secondary | ICD-10-CM

## 2020-11-02 DIAGNOSIS — W19XXXD Unspecified fall, subsequent encounter: Secondary | ICD-10-CM

## 2020-11-02 DIAGNOSIS — E559 Vitamin D deficiency, unspecified: Secondary | ICD-10-CM

## 2020-11-02 DIAGNOSIS — R739 Hyperglycemia, unspecified: Secondary | ICD-10-CM

## 2020-11-02 LAB — CBC
HCT: 41.6 % (ref 36.0–46.0)
Hemoglobin: 13.7 g/dL (ref 12.0–15.0)
MCHC: 32.9 g/dL (ref 30.0–36.0)
MCV: 88.7 fl (ref 78.0–100.0)
Platelets: 231 10*3/uL (ref 150.0–400.0)
RBC: 4.69 Mil/uL (ref 3.87–5.11)
RDW: 14.8 % (ref 11.5–15.5)
WBC: 5.5 10*3/uL (ref 4.0–10.5)

## 2020-11-02 LAB — T4, FREE: Free T4: 0.69 ng/dL (ref 0.60–1.60)

## 2020-11-02 LAB — LIPID PANEL
Cholesterol: 152 mg/dL (ref 0–200)
HDL: 73.6 mg/dL (ref >39.00)
LDL Cholesterol: 64 mg/dL (ref 0–99)
NonHDL: 78.29
Total CHOL/HDL Ratio: 2
Triglycerides: 72 mg/dL (ref 0.0–149.0)
VLDL: 14.4 mg/dL (ref 0.0–40.0)

## 2020-11-02 LAB — COMPREHENSIVE METABOLIC PANEL
ALT: 19 U/L (ref 0–35)
AST: 22 U/L (ref 0–37)
Albumin: 4.2 g/dL (ref 3.5–5.2)
Alkaline Phosphatase: 63 U/L (ref 39–117)
BUN: 18 mg/dL (ref 6–23)
CO2: 31 mEq/L (ref 19–32)
Calcium: 9.9 mg/dL (ref 8.4–10.5)
Chloride: 100 mEq/L (ref 96–112)
Creatinine, Ser: 0.86 mg/dL (ref 0.40–1.20)
GFR: 64.3 mL/min (ref >60.00)
Glucose, Bld: 80 mg/dL (ref 70–99)
Potassium: 4.2 mEq/L (ref 3.5–5.1)
Sodium: 139 mEq/L (ref 135–145)
Total Bilirubin: 0.4 mg/dL (ref 0.2–1.2)
Total Protein: 7 g/dL (ref 6.0–8.3)

## 2020-11-02 LAB — TSH: TSH: 3.34 u[IU]/mL (ref 0.35–4.50)

## 2020-11-02 LAB — VITAMIN D 25 HYDROXY (VIT D DEFICIENCY, FRACTURES): VITD: 54.68 ng/mL (ref 30.00–100.00)

## 2020-11-02 LAB — HEMOGLOBIN A1C: Hgb A1c MFr Bld: 6.1 % (ref 4.6–6.5)

## 2020-11-02 NOTE — Patient Instructions (Addendum)
Moist heat, gentle stretching COVID booster in next month  Sacroiliac Joint Dysfunction  Sacroiliac joint dysfunction is a condition that causes inflammation on one or both sides of the sacroiliac (SI) joint. The SI joint is the joint between two bones of the pelvis called the sacrum and the ilium. The sacrum is the bone at the base of the spine. The ilium is the large bone that forms the hip. This condition causes deep aching or burning pain in the low back. In some cases, the pain may also spread into one or both buttocks, hips, or thighs. What are the causes? This condition may be caused by:  Pregnancy. During pregnancy, extra stress is put on the SI joints because the pelvis widens.  Injury, such as: ? Injuries from car crashes. ? Sports-related injuries. ? Work-related injuries.  Having one leg that is shorter than the other.  Conditions that affect the joints, such as: ? Rheumatoid arthritis. ? Gout. ? Psoriatic arthritis. ? Joint infection (septic arthritis). Sometimes, the cause of SI joint dysfunction is not known. What are the signs or symptoms? Symptoms of this condition include:  Aching or burning pain in the lower back. The pain may also spread to other areas, such as: ? Buttocks. ? Groin. ? Thighs.  Muscle spasms in or around the painful areas.  Increased pain when standing, walking, running, stair climbing, bending, or lifting. How is this diagnosed? This condition is diagnosed with a physical exam and your medical history. During the exam, the health care provider may move one or both of your legs to different positions to check for pain. Various tests may be done to confirm the diagnosis, including:  Imaging tests to look for other causes of pain. These may include: ? MRI. ? CT scan. ? Bone scan.  Diagnostic injection. A numbing medicine is injected into the SI joint using a needle. If your pain is temporarily improved or stopped after the injection, this  can indicate that SI joint dysfunction is the problem. How is this treated? Treatment depends on the cause and severity of your condition. Treatment options can be noninvasive and may include:  Ice or heat applied to the lower back area after an injury. This may help reduce pain and muscle spasms.  Medicines to relieve pain or inflammation or to relax the muscles.  Wearing a back brace (sacroiliac brace) to help support the joint while your back is healing.  Physical therapy to increase muscle strength around the joint and flexibility at the joint. This may also involve learning proper body positions and ways of moving to relieve stress on the joint.  Direct manipulation of the SI joint.  Use of a device that provides electrical stimulation to help reduce pain at the joint. Other treatments may include:  Injections of steroid medicine into the joint to reduce pain and swelling.  Radiofrequency ablation. This treatment uses heat to burn away nerves that are carrying pain messages from the joint.  Surgery to put in screws and plates that limit or prevent joint motion. This is rare. Follow these instructions at home: Medicines  Take over-the-counter and prescription medicines only as told by your health care provider.  Ask your health care provider if the medicine prescribed to you: ? Requires you to avoid driving or using machinery. ? Can cause constipation. You may need to take these actions to prevent or treat constipation:  Drink enough fluid to keep your urine pale yellow.  Take over-the-counter or prescription medicines.  Eat foods that are high in fiber, such as beans, whole grains, and fresh fruits and vegetables.  Limit foods that are high in fat and processed sugars, such as fried or sweet foods. If you have a brace:  Wear the brace as told by your health care provider. Remove it only as told by your health care provider.  Keep the brace clean.  If the brace is not  waterproof: ? Do not let it get wet. ? Cover it with a watertight covering when you take a bath or a shower. Managing pain, stiffness, and swelling  Icing can help with pain and swelling. Heat may help with muscle tension or spasms. Ask your health care provider if you should use ice or heat.  If directed, put ice on the affected area: ? If you have a removable brace, remove it as told by your health care provider. ? Put ice in a plastic bag. ? Place a towel between your skin and the bag. ? Leave the ice on for 20 minutes, 2-3 times a day. ? Remove the ice if your skin turns bright red. This is very important. If you cannot feel pain, heat, or cold, you have a greater risk of damage to the area.  If directed, apply heat to the affected area as often as told by your health care provider. Use the heat source that your health care provider recommends, such as a moist heat pack or a heating pad. ? Place a towel between your skin and the heat source. ? Leave the heat on for 20-30 minutes. ? Remove the heat if your skin turns bright red. This is especially important if you are unable to feel pain, heat, or cold. You may have a greater risk of getting burned.      General instructions  Rest as needed. Return to your normal activities as told by your health care provider. Ask your health care provider what activities are safe for you.  Do exercises as told by your health care provider or physical therapist.  Keep all follow-up visits. This is important. Contact a health care provider if:  Your pain is not controlled with medicine.  You have a fever.  Your pain is getting worse. Get help right away if:  You have weakness, numbness, or tingling in your legs or feet.  You lose control of your bladder or bowels. Summary  Sacroiliac (SI) joint dysfunction is a condition that causes inflammation on one or both sides of the SI joint.  This condition causes deep aching or burning pain in  the low back. In some cases, the pain may also spread into one or both buttocks, hips, or thighs.  Treatment depends on the cause and severity of your condition. It may include medicines to reduce pain and swelling or to relax muscles. This information is not intended to replace advice given to you by your health care provider. Make sure you discuss any questions you have with your health care provider. Document Revised: 11/11/2019 Document Reviewed: 11/11/2019 Elsevier Patient Education  [place].

## 2020-11-02 NOTE — Progress Notes (Signed)
Patient ID: Terri Sandoval, female    DOB: 08-28-1940  Age: 80 y.o. MRN: [id number]    Subjective:  Subjective  HPI Terri Sandoval presents for office visit today for follow up on chronic medical concerns including HTN and hyperlipidemia. She reports having left hip pain that was due to a fall that happened about 2 months ago. She denies any chest pain, SOB, fever, abdominal pain, cough, chills, sore throat, dysuria, urinary incontinence, back pain, HA, or N/VD. She states that she has bought a [employer] 2 workout machine that she uses for home physical exercise. She reports that when she stand, her pelvic pain gets worse and she feels a stretching sensation when she bends down. She states when she is stepping down when walking, her left hip pain worsens. She endorses using a cane which helps with her balance, alleviates her left hip pain, and gives her confidence when walking. She endorses sleeping for 7-8 hours daily and goes to bed by 10 pm. She endorses that she had their son at their house recently which caused them some stress but they still enjoyed the family time. She reports that she has had her 3 COVID-19 shots and expresses interest in getting her 4th booster. She endorses avoiding crowded areas at church and wearing masks when in public.   Review of Systems  Constitutional: Negative for chills, fatigue and fever.  HENT: Negative for congestion, rhinorrhea, sinus pressure, sinus pain and sore throat.   Eyes: Negative for pain.  Respiratory: Negative for cough and shortness of breath.   Cardiovascular: Negative for chest pain, palpitations and leg swelling.  Gastrointestinal: Negative for abdominal pain, blood in stool, constipation, diarrhea, nausea and vomiting.  Genitourinary: Negative for decreased urine volume, flank pain, frequency, vaginal bleeding and vaginal discharge.  Musculoskeletal: Positive for arthralgias (local to pelvic joints). Negative for back pain.       (+) left hip  pain  Neurological: Negative for headaches.    History Past Medical History:  Diagnosis Date  . Adrenal insufficiency ([place])   . Anxiety   . Arthritis   . Atrophic vaginitis 11/08/2013  . BPV (benign positional vertigo) 05/08/2015  . Cataract   . Chicken pox as a child  . Colon polyps    removed during colonscopy  . Diverticulitis   . Diverticulosis   . GERD (gastroesophageal reflux disease)   . Hyperlipidemia   . Hypertension   . Medicare annual wellness visit, subsequent 05/16/2013  . Osteoporosis, unspecified 03/09/2013  . Preventative health care 11/07/2015  . Skin cancer    behind ear  . Urine incontinence    sometimes  . [place] 11/07/2015  . Vitamin D deficiency 11/07/2015    She has a past surgical history that includes Abdominal hysterectomy (97); Tonsillectomy and adenoidectomy (1990); Dilation and curettage of uterus (1990, 1993, 1997); Breast biopsy (Left); Cataract extraction (L 09/11/10, R 10/10/10); and Eye surgery (Bilateral, 10/10/2010, 07/28/17).   Her family history includes Arthritis in her mother; Cerebral palsy in her son; Colon cancer in her cousin and father; Diabetes in her sister; Heart disease in her mother and sister; Hyperlipidemia in her mother and sister; Hypertension in her mother and sister; Mental illness in her maternal uncle; Pneumonia in her maternal grandfather; Stroke in her maternal grandmother.She reports that she has never smoked. She has never used smokeless tobacco. She reports that she does not drink alcohol and does not use drugs.  Current Outpatient Medications on File Prior to Visit  Medication  Sig Dispense Refill  . alclomethasone (ACLOVATE) 0.05 % ointment Apply topically 2 (two) times daily.    . Cholecalciferol (VITAMIN D3) 50 MCG (2000 UT) capsule Take 1 capsule by mouth daily.    . fish oil-omega-3 fatty acids 1000 MG capsule Take 1,200 mg by mouth daily.    Marland Kitchen FLUoxetine (PROZAC) 20 MG capsule Take 1 capsule (20 mg total) by mouth  daily. 90 capsule 1  . glucosamine-chondroitin 500-400 MG tablet Take 1 tablet by mouth 2 (two) times daily.     . methylcellulose (CITRUCEL) oral powder Use as directed Daily    . Multiple Vitamin (MULTIVITAMIN) tablet Take 1 tablet by mouth daily. Vitamin pack from [facility]    . mupirocin ointment (BACTROBAN) 2 % 1 application 3 (three) times daily.    . ramipril (ALTACE) 10 MG capsule Take 1 capsule (10 mg total) by mouth daily. 90 capsule 1  . simvastatin (ZOCOR) 20 MG tablet Take 1 tablet (20 mg total) by mouth daily at 6 PM. 90 tablet 1   No current facility-administered medications on file prior to visit.     Objective:  Objective  Physical Exam Constitutional:      General: She is not in acute distress.    Appearance: Normal appearance. She is not ill-appearing or toxic-appearing.  HENT:     Head: Normocephalic and atraumatic.     Right Ear: Tympanic membrane, ear canal and external ear normal.     Left Ear: Tympanic membrane, ear canal and external ear normal.     Nose: No congestion or rhinorrhea.  Eyes:     Extraocular Movements: Extraocular movements intact.     Pupils: Pupils are equal, round, and reactive to light.  Cardiovascular:     Rate and Rhythm: Normal rate and regular rhythm.     Pulses: Normal pulses.     Heart sounds: Normal heart sounds. No murmur heard.   Pulmonary:     Effort: Pulmonary effort is normal. No respiratory distress.     Breath sounds: Normal breath sounds. No wheezing, rhonchi or rales.  Abdominal:     General: Bowel sounds are normal.     Palpations: Abdomen is soft. There is no mass.     Tenderness: There is no abdominal tenderness. There is no guarding.     Hernia: No hernia is present.  Musculoskeletal:        General: Normal range of motion.     Cervical back: Normal range of motion and neck supple.  Skin:    General: Skin is warm and dry.  Neurological:     Mental Status: She is alert and oriented to person, place, and time.   Psychiatric:        Behavior: Behavior normal.    BP 106/70   Pulse 72   Temp 98 F (36.7 C)   Resp 16   Wt 141 lb 9.6 oz (64.2 kg)   SpO2 90%   BMI 28.60 kg/m  Wt Readings from Last 3 Encounters:  11/02/20 141 lb 9.6 oz (64.2 kg)  05/04/20 141 lb 4.8 oz (64.1 kg)  04/03/20 138 lb (62.6 kg)     Lab Results  Component Value Date   WBC 5.4 05/16/2020   HGB 12.8 05/16/2020   HCT 39.7 05/16/2020   PLT 238 05/16/2020   GLUCOSE 102 (H) 05/16/2020   CHOL 166 05/16/2020   TRIG 93 05/16/2020   HDL 72 05/16/2020   LDLCALC 76 05/16/2020   ALT 17 05/16/2020  AST 21 05/16/2020   NA 139 05/16/2020   K 4.2 05/16/2020   CL 102 05/16/2020   CREATININE 0.84 05/16/2020   BUN 14 05/16/2020   CO2 28 05/16/2020   TSH 4.54 (H) 05/16/2020   HGBA1C 5.7 (H) 05/16/2020   MICROALBUR <0.7 05/04/2018    DG Bone Density  Result Date: 10/02/2020 EXAM: DUAL X-RAY ABSORPTIOMETRY (DXA) FOR BONE MINERAL DENSITY IMPRESSION: Kadyn Chovan A Wren Pryce Your patient Terri Sandoval completed a BMD test on 10/02/2020 using the [place] (analysis version: 16.SP2) manufactured by [employer]. The following summarizes the results of our evaluation. [place] PATIENT: Name: Terri Sandoval, Terri Sandoval Patient ID: [id number] Birth Date: 03/26/1941 Height: 59.0 in. Gender: Female Measured: 10/02/2020 Weight: 141.0 lbs. Indications: Advanced Age, Caucasian, Estrogen Deficiency, Family Hx of Osteoporosis, History of Osteoporosis, Hysterectomy, Post Menopausal Fractures: Pelvis, Ribs, Wrist Treatments: Calcium, Fosamax(Alendronate), Multivitamin, Vitamin D ASSESSMENT: The BMD measured at Femur Neck Left is 0.651 g/cm2 with a T-score of -2.8. This patient is considered osteoporotic according to [facility]) criteria. Lumbar spine was excluded due to degenerative changes. Compared with the prior study on, 111/01/2015 the BMD of the total mean shows no statistically signficant change. Site Region Measured Date Measured  Age WHO YA BMD Classification T-score DualFemur Neck Left 10/02/2020 79.2 Osteoporosis -2.8 0.651 g/cm2 DualFemur Neck Left 05/22/2015 73.8 Osteoporosis -2.6 0.672 g/cm2 DualFemur Total Mean 10/02/2020 79.2 Osteopenia -2.0 0.751 g/cm2 DualFemur Total Mean 05/22/2015 73.8 Osteopenia -2.1 0.739 g/cm2 Left Forearm Radius 33% 10/02/2020 79.2 Osteopenia -1.6 0.732 g/cm2 World Health Organization [facility]) criteria for post-menopausal, Caucasian Women: Normal       T-score at or above -1 SD Osteopenia   T-score between -1 and -2.5 SD Osteoporosis T-score at or below -2.5 SD RECOMMENDATION: 1. All patients should optimize calcium and vitamin D intake. 2. Consider FDA-approved medical therapies in postmenopausal women and men aged 46 years and older, based on the following: a. A hip or vertebral(clinical or morphometric) fracture. b. T-Score < -2.5 at the femoral neck or spine after appropriate evaluation to exclude secondary causes c. Low bone mass (T-score between -1.0 and -2.5 at the femoral neck or spine) and a 10 year probability of a hip fracture >3% or a 10 year probability of major osteoporosis-related fracture > 20% based on the [place]-adapted WHO algorithm d. Clinical judgement and/or patient preferences may indicate treatment for people with 10-year fracture probabilities above or below these levels FOLLOW-UP: Patients with diagnosis of osteoporosis or at high risk for fracture should have regular bone mineral density tests. For patients eligible for Medicare, routine testing is allowed once every 2 years. The testing frequency can be increased to one year for patients who have rapidly progressing disease, those who are receiving or discontinuing medical therapy to restore bone mass, or have additional risk factors. I have reviewed this report, and agree with the above findings. [facility] Radiology Electronically Signed   By: Lowella Grip III M.D.   On: 10/02/2020 14:22   MM 3D SCREEN BREAST BILATERAL  Result  Date: 10/03/2020 CLINICAL DATA:  Screening. EXAM: DIGITAL SCREENING BILATERAL MAMMOGRAM WITH TOMOSYNTHESIS AND CAD TECHNIQUE: Bilateral screening digital craniocaudal and mediolateral oblique mammograms were obtained. Bilateral screening digital breast tomosynthesis was performed. The images were evaluated with computer-aided detection. COMPARISON:  Previous exam(s). ACR Breast Density Category c: The breast tissue is heterogeneously dense, which may obscure small masses. FINDINGS: There are no findings suspicious for malignancy. The images were evaluated with computer-aided detection. IMPRESSION: No mammographic  evidence of malignancy. A result letter of this screening mammogram will be mailed directly to the patient. RECOMMENDATION: Screening mammogram in one year. (Code:SM-B-01Y) BI-RADS CATEGORY  1: Negative. Electronically Signed   By: Lajean Manes M.D.   On: 10/03/2020 08:52     Assessment & Plan:  Plan   No orders of the defined types were placed in this encounter.   Problem List Items Addressed This Visit    Hypertension - Primary    Well controlled, no changes to meds. Encouraged heart healthy diet such as the DASH diet and exercise as tolerated.       Relevant Orders   CBC   Comprehensive metabolic panel   TSH   T4, free   Hyperlipidemia    Encouraged heart healthy diet, increase exercise, avoid trans fats, consider a krill oil cap daily      Relevant Orders   Lipid panel   Vitamin D deficiency    Supplement and monitor      Relevant Orders   VITAMIN D 25 Hydroxy (Vit-D Deficiency, Fractures)   Fall   Relevant Orders   T4, free   Hyperglycemia    hgba1c acceptable, minimize simple carbs. Increase exercise as tolerated.       Relevant Orders   Hemoglobin A1c   Chronic left sacroiliac pain    She fell about a month ago and her left hip has hurt since. It hurts most when she first gets up in am and it is the posterior hip and can radiate down back of thigh. Is not  debilitating at this time and she does not desire referral or further work up at this time. She will let us know if worsens. Encouraged moist heat and gentle stretching as tolerated. May try NSAIDs and prescription meds as directed and report if symptoms worsen or seek immediate care      Abnormal TSH    Monitor labs and symptoms      Relevant Orders   TSH   T4, free      Follow-up: Return in about 6 months (around 05/04/2021) for annual exam.   I,David Hanna,acting as a scribe for Penni Homans, MD.,have documented all relevant documentation on the behalf of Penni Homans, MD,as directed by  Penni Homans, MD while in the presence of Penni Homans, MD.  I, Mosie Lukes, MD personally performed the services described in this documentation. All medical record entries made by the scribe were at my direction and in my presence. I have reviewed the chart and agree that the record reflects my personal performance and is accurate and complete

## 2020-11-02 NOTE — Assessment & Plan Note (Signed)
hgba1c acceptable, minimize simple carbs. Increase exercise as tolerated.  

## 2020-11-02 NOTE — Assessment & Plan Note (Signed)
Supplement and monitor 

## 2020-11-02 NOTE — Assessment & Plan Note (Signed)
Encouraged heart healthy diet, increase exercise, avoid trans fats, consider a krill oil cap daily 

## 2020-11-02 NOTE — Assessment & Plan Note (Addendum)
She fell about a month ago and her left hip has hurt since. It hurts most when she first gets up in am and it is the posterior hip and can radiate down back of thigh. Is not debilitating at this time and she does not desire referral or further work up at this time. She will let us know if worsens. Encouraged moist heat and gentle stretching as tolerated. May try NSAIDs and prescription meds as directed and report if symptoms worsen or seek immediate care

## 2020-11-02 NOTE — Assessment & Plan Note (Signed)
Well controlled, no changes to meds. Encouraged heart healthy diet such as the DASH diet and exercise as tolerated.  °

## 2020-11-02 NOTE — Assessment & Plan Note (Signed)
Monitor labs and symptoms

## 2020-11-28 ENCOUNTER — Other Ambulatory Visit: Payer: Self-pay | Admitting: Family Medicine

## 2020-12-25 ENCOUNTER — Ambulatory Visit (INDEPENDENT_AMBULATORY_CARE_PROVIDER_SITE_OTHER): Payer: Medicare Other | Admitting: Pharmacist

## 2020-12-25 DIAGNOSIS — I10 Essential (primary) hypertension: Secondary | ICD-10-CM | POA: Diagnosis not present

## 2020-12-25 DIAGNOSIS — E782 Mixed hyperlipidemia: Secondary | ICD-10-CM | POA: Diagnosis not present

## 2020-12-25 DIAGNOSIS — M818 Other osteoporosis without current pathological fracture: Secondary | ICD-10-CM

## 2020-12-25 DIAGNOSIS — F419 Anxiety disorder, unspecified: Secondary | ICD-10-CM

## 2020-12-25 DIAGNOSIS — E559 Vitamin D deficiency, unspecified: Secondary | ICD-10-CM

## 2020-12-25 NOTE — Chronic Care Management (AMB) (Signed)
Chronic Care Management Pharmacy Note  12/25/2020 Name:  Terri Sandoval MRN:  [id number] DOB:  11-25-1940  Summary: Bone density checked 10/02/2020 showed decrease in BMD with lowest T-Score of -2.8. Has history of pelvic and rib fractures. Took alendronate / Fosamax 1999 to 2011. No current therapy for bone health except MVI and vitamin D. Dr Charlett Blake has discussed Prolia but patient has hesitant due to side effects.   Recommendations/Changes made from today's visit: Consider starting Prolia Continue to get 1279m calcium from supplementation and food Continue to take vitamin D daily   Subjective: Terri Sandoval an 80y.o. year old female who is a primary patient of BMosie Lukes MD.  The CCM team was consulted for assistance with disease management and care coordination needs.    Engaged with patient by telephone for follow up visit in response to provider referral for pharmacy case management and/or care coordination services.   Consent to Services:  The patient was given information about Chronic Care Management services, agreed to services, and gave verbal consent prior to initiation of services.  Please see initial visit note for detailed documentation.   Patient Care Team: BMosie Lukes MD as PCP - General (Family Medicine) SMurvin Donning MD (Dermatology) Armbruster, SCarlota Raspberry MD as Consulting Physician (Gastroenterology) FBethann Goo [place] Referring Physician (Chiropractic Medicine) DLinward Natal MD as Consulting Physician (Ophthalmology) ECherre Robins PharmD (Pharmacist)  Recent office visits: 11/02/2020 - PCP (Dr BCharlett Blake F/U chronic medical concerns; no medication changes.   Recent consult visits: None in last 6 months  Hospital visits: None in previous 6 months  Objective:  Lab Results  Component Value Date   CREATININE 0.86 11/02/2020   CREATININE 0.84 05/16/2020   CREATININE 0.77 11/02/2019    Lab Results  Component Value Date   HGBA1C  6.1 11/02/2020   Last diabetic Eye exam: No results found for: HMDIABEYEEXA  Last diabetic Foot exam: No results found for: HMDIABFOOTEX      Component Value Date/Time   CHOL 152 11/02/2020 1126   TRIG 72.0 11/02/2020 1126   HDL 73.60 11/02/2020 1126   CHOLHDL 2 11/02/2020 1126   VLDL 14.4 11/02/2020 1126   LDLCALC 64 11/02/2020 1126   LDLCALC 76 05/16/2020 0812    Hepatic Function Latest Ref Rng & Units 11/02/2020 05/16/2020 11/02/2019  Total Protein 6.0 - 8.3 g/dL 7.0 6.6 6.9  Albumin 3.5 - 5.2 g/dL 4.2 - 4.3  AST 0 - 37 U/L 22 21 21   ALT 0 - 35 U/L 19 17 16   Alk Phosphatase 39 - 117 U/L 63 - 76  Total Bilirubin 0.2 - 1.2 mg/dL 0.4 0.5 0.4  Bilirubin, Direct 0.0 - 0.3 mg/dL - - -    Lab Results  Component Value Date/Time   TSH 3.34 11/02/2020 11:26 AM   TSH 4.54 (H) 05/16/2020 08:12 AM   FREET4 0.69 11/02/2020 11:26 AM   FREET4 1.0 05/16/2020 08:12 AM    CBC Latest Ref Rng & Units 11/02/2020 05/16/2020 11/02/2019  WBC 4.0 - 10.5 K/uL 5.5 5.4 5.8  Hemoglobin 12.0 - 15.0 g/dL 13.7 12.8 13.4  Hematocrit 36.0 - 46.0 % 41.6 39.7 40.7  Platelets 150.0 - 400.0 K/uL 231.0 238 214.0    Lab Results  Component Value Date/Time   VD25OH 54.68 11/02/2020 11:26 AM   VD25OH 57 05/16/2020 08:12 AM   VD25OH 60.23 11/02/2019 11:19 AM    Clinical ASCVD: No  The 10-year ASCVD risk score ([place]  Brooke Bonito., et al., 2013) is: 23.9%   Values used to calculate the score:     Age: 80 years     Sex: Female     Is Non-Hispanic African American: No     Diabetic: No     Tobacco smoker: No     Systolic Blood Pressure: 997 mmHg     Is BP treated: Yes     HDL Cholesterol: 73.6 mg/dL     Total Cholesterol: 152 mg/dL    Other: See care plan for DEXA  Depression screen [facility] 2/9 12/25/2020 06/26/2020 01/04/2020  Decreased Interest 0 0 0  Down, Depressed, Hopeless 0 - 0  PHQ - 2 Score 0 0 0  Altered sleeping - 0 -  Tired, decreased energy - 1 -  Change in appetite - 0 -  Feeling bad or failure  about yourself  - 0 -  Trouble concentrating - 1 -  Moving slowly or fidgety/restless - 1 -  Suicidal thoughts - 0 -  PHQ-9 Score - 3 -     Social History   Tobacco Use  Smoking Status Never  Smokeless Tobacco Never   BP Readings from Last 3 Encounters:  11/02/20 106/70  05/04/20 128/68  04/03/20 124/73   Pulse Readings from Last 3 Encounters:  11/02/20 72  05/04/20 74  04/03/20 75   Wt Readings from Last 3 Encounters:  11/02/20 141 lb 9.6 oz (64.2 kg)  05/04/20 141 lb 4.8 oz (64.1 kg)  04/03/20 138 lb (62.6 kg)    Assessment: Review of patient past medical history, allergies, medications, health status, including review of consultants reports, laboratory and other test data, was performed as part of comprehensive evaluation and provision of chronic care management services.   SDOH:  (Social Determinants of Health) assessments and interventions performed:  SDOH Interventions    Flowsheet Row Most Recent Value  SDOH Interventions   Financial Strain Interventions Intervention Not Indicated  Physical Activity Interventions Intervention Not Indicated       CCM Care Plan  Allergies  Allergen Reactions   Sulfamethoxazole-Trimethoprim Other (See Comments)    Very sore, sore in corner of lip    Medications Reviewed Today     Reviewed by [name], PharmD (Pharmacist) on 12/25/20 at 2  Med List Status: <None>   Medication Order Taking? Sig Documenting Provider Last Dose Status Informant  alclomethasone (ACLOVATE) 0.05 % ointment [id number] Yes Apply topically 2 (two) times daily. [provider] Taking Active   Calcium Carbonate Antacid (TUMS PO) [id number] Yes Take 1 tablet by mouth as needed. [provider] Taking Active   cetirizine (ZYRTEC) 10 MG tablet [id number] Yes Take 10 mg by mouth daily as needed for allergies. [provider] Taking Active   Cholecalciferol (VITAMIN D3) 50 MCG (2000 UT) capsule [id number] Yes Take 1 capsule  by mouth daily. [provider] Taking Active   fish oil-omega-3 fatty acids 1000 MG capsule [id number] Yes Take 1,200 mg by mouth daily. [provider] Taking Active   FLUoxetine (PROZAC) 20 MG capsule [id number] Yes TAKE 1 CAPSULE(20 MG) BY MOUTH DAILY Mosie Lukes, MD Taking Active   glucosamine-chondroitin 500-400 MG tablet [id number] Yes Take 1 tablet by mouth 2 (two) times daily.  [provider] Taking Active   methylcellulose (CITRUCEL) oral powder [id number] Yes Use as directed Daily Armbruster, Carlota Raspberry, MD Taking Active   Multiple Vitamin (MULTIVITAMIN) tablet [id number] Yes Take 1 tablet by mouth daily. Vitamin pack from [facility] [provider]  Taking Active   mupirocin ointment (BACTROBAN) 2 % [id number] Yes 1 application 3 (three) times daily. If needed [provider] Taking Active   ramipril (ALTACE) 10 MG capsule [id number] Yes TAKE 1 CAPSULE(10 MG) BY MOUTH DAILY Mosie Lukes, MD Taking Active   simvastatin (ZOCOR) 20 MG tablet [id number] Yes TAKE 1 TABLET(20 MG) BY MOUTH DAILY AT 6 PM Mosie Lukes, MD Taking Active   Skin Protectants, Misc. ([place]) [id number] Yes Apply topically at bedtime. [provider] Taking Active             Patient Active Problem List   Diagnosis Date Noted   Chronic left sacroiliac pain 11/02/2020   Abnormal TSH 11/02/2020   Diarrhea 11/02/2019   Lip lesion 05/04/2019   Allergy 11/15/2018   Fall 12/04/2017   Hyperglycemia 12/04/2017   Abnormal results of thyroid function studies 05/26/2017   Vitamin D deficiency 11/07/2015   Preventative health care 11/07/2015   Verruca 11/07/2015   BPV (benign positional vertigo) 05/08/2015   Skin cancer 10/31/2014   Atrophic vaginitis 11/08/2013   Cerumen impaction 06/11/2013   Medicare annual wellness visit, subsequent 05/16/2013   Osteoporosis 03/09/2013   GERD (gastroesophageal reflux disease)    Hypertension    Anxiety     Hyperlipidemia    Arthritis    Chicken pox    Diverticulosis    Colon polyps    Urine incontinence     Immunization History  Administered Date(s) Administered   Fluad Quad(high Dose 65+) 05/04/2019   Influenza, High Dose Seasonal PF 05/09/2016, 05/22/2017, 05/07/2018   Influenza,inj,Quad PF,6+ Mos 04/13/2013, 04/20/2014, 05/08/2015   Influenza-Unspecified 05/04/2020   PFIZER(Purple Top)SARS-COV-2 Vaccination 08/03/2019, 08/24/2019, 05/29/2020   Pneumococcal Conjugate-13 11/12/2013   Pneumococcal Polysaccharide-23 04/25/2011   Tdap 01/12/2010, 09/21/2020   Zoster Recombinat (Shingrix) 06/10/2017, 08/08/2017, 09/08/2017   Zoster, Live 07/16/2011    Conditions to be addressed/monitored: HTN, HLD, Anxiety, Depression, and osteoporosis; vit D deficiency; pre DM; GERD; OA  Care Plan : General Pharmacy (Adult)  Updates made by [name], PHARMD since 12/25/2020 12:00 AM     Problem: CHL AMB "PATIENT-SPECIFIC PROBLEM"   Priority: High  Onset Date: 12/25/2020     Long-Range Goal: Patient-Specific Goal   Start Date: 12/25/2020  This Visit's Progress: On track  Priority: High  Note:   Current Barriers:  Suboptimal therapeutic regimen for osteoporosis Chronic Disease Management support, education, and care coordination needs related to Hypertension, Hyperlipidemia, Pre-Diabetes, Depression, Osteoporosis, Allergic Rhinitis  Pharmacist Clinical Goal(s):  Over the next 180 days, patient will maintain control of HTN, pre DM and hyperlipidemia as evidenced by maintaining goals below  adhere to plan to optimize therapeutic regimen for osteoporosis as evidenced by report of adherence to recommended medication management changes through collaboration with PharmD and provider.   Interventions: 1:1 collaboration with Mosie Lukes, MD regarding development and update of comprehensive plan of care as evidenced by provider attestation and co-signature Inter-disciplinary care team  collaboration (see longitudinal plan of care) Comprehensive medication review performed; medication list updated in electronic medical record   Hypertension Controlled; BP goal <140/90 Not checking regularly at home but BP has been at goal for the last 3 OVs. Denies dizziness Current regimen:  Ramipiril 22m daily Interventions: Discussed blood pressure goal Reviewed refill records / adherence Maintain current hypertension medication regimen.   Hyperlipidemia Lab Results  Component Value Date/Time   LDLCALC 64 11/02/2020 11:26 AM   LDLCALC 76 05/16/2020 08:12 AM  Controlled; LDL  goal < 100 Current regimen:  Simvastatin 63m daily Fish Oil 12039mdaily Interventions: Discussed LDL goal Reviewed refill records / adherence Maintain current cholesterol medication regimen.   Pre-Diabetes Controlled;  A1c goal <6.5% Current regimen:  Diet and exercise management   Interventions: Discussed A1c goal Discussed diet and exercie Continue to limit intake of sugar containing foods and beverages Continue to use Cubbi exerciser - goal si to get at least 150 minutes of exercise per week.   Osteoporosis / vitamin D deficeincy Goals: prevent falls and reduce risk of fracture due to osteoporosis; normalize serum vitamin D through supplementation Last DEXA 10/02/2020:  Left neck of femur = -2.8 Left forearm = -1.6 Previous DEXA  05/22/2015: Left femoral neck: -2.6  Lumbar spine: -1.2 Previous medications: took Fosamax for 12 years; stopped in August 2011.  History of pelvic fracture from fall when getting out of car History of rib fracture from fall in bath tub (added grab bar after this fall) Current regimen:  Vitamin D 2000 units daily Multi-vitamin  with calcium daily Tums if needed for heartburn Interventions: Reviewed last bone density results; Will coordinate with PCP to consider pharmacotherapy (maybe Prolia)  Reviewed calcium intake form diet and supplements Continue to  practice home safety to prevent falls.  Continue to eat calcium rich foods and take multivitamin daily - goal is to get 120078mf calcium from combination of foods and supplements daily Continue to take vitamin D supplement daily  Health Maintenance  Interventions: Reviewed immunization records Recommended COVID 19 - 2nd booster   Medication management Current pharmacy: [facility] Interventions Comprehensive medication review performed. Continue current medication management strategy Reviewed refill history to assess adherence.   Patient Goals/Self-Care Activities Over the next 180 days, patient will:  take medications as prescribed, target a minimum of 150 minutes of moderate intensity exercise weekly, and consider pharmacotherapy for bone health; get 2nd COVID 19 booster  Follow Up Plan: Clinical pharmacist will f/u by phone in 6 months unless needed sooner.       Medication Assistance: None required.  Patient affirms current coverage meets needs.  [id number] [place] [address] [id number]: [phone]: [phone] Up:  Patient agrees to Care Plan and Follow-up.  Plan: Telephone follow up appointment with care management team member scheduled for:  6 months  TamCherre RobinsharmD Clinical Pharmacist [phone]

## 2020-12-25 NOTE — Patient Instructions (Signed)
Terri Sandoval,  It was a pleasure speaking with you today. Please feel free to contact me if you have any questions or concerns. Below is information regarding your health goals.   Keep up the good work!  [name], PharmD Clinical Pharmacist [facility] [phone]  Visit Information  PATIENT GOALS:  Goals Addressed             This Visit's Progress    Chronic Care Management Pharmacy Care Plan   On track    CARE PLAN ENTRY (see longitudinal plan of care for additional care plan information)  Current Barriers:  Chronic Disease Management support, education, and care coordination needs related to Hypertension, Hyperlipidemia, Pre-Diabetes, Depression, Osteoporosis, Allergic Rhinitis   Hypertension BP Readings from Last 3 Encounters:  11/02/20 106/70  05/04/20 128/68  04/03/20 124/73  Pharmacist Clinical Goal(s): Over the next 180 days, patient will work with PharmD and providers to maintain BP goal <140/90 Current regimen:  Ramipiril 10mg  daily Interventions: Discussed blood pressure goal Patient self care activities - Over the next 180 days, patient will: Maintain current hypertension medication regimen.   Hyperlipidemia Lab Results  Component Value Date/Time   LDLCALC 64 11/02/2020 11:26 AM   LDLCALC 76 05/16/2020 08:12 AM  Pharmacist Clinical Goal(s): Over the next 180 days, patient will work with PharmD and providers to maintain LDL goal < 100 Current regimen:  Simvastatin 20mg  daily Fish Oil 1200mg  daily Interventions: Discussed LDL goal Patient self care activities - Over the next 180 days, patient will: Maintain current cholesterol medication regimen.   Pre-Diabetes Lab Results  Component Value Date/Time   HGBA1C 6.1 11/02/2020 11:26 AM   HGBA1C 5.7 (H) 05/16/2020 08:12 AM  Pharmacist Clinical Goal(s): Over the next 180 days, patient will work with PharmD and providers to maintain A1c goal <6.5% Current regimen:   Diet and exercise management   Interventions: Discussed A1c goal Discussed diet and exercie Patient self care activities - Over the next 180 days, patient will: Maintain a1c less than 6.5% Continue to limit intake of sugar containing foods and beverages Continue to use Cubbi exerciser - goal si to get at least 150 minutes of exercise per week.   Osteoporosis Pharmacist Clinical Goal(s) Over the next 180 days, patient will work with PharmD and providers to reduce risk of fracture due to osteoporosis Current regimen:  Vitamin D 2000 units daily Multi-vitamin  with calcium daily Tums if needed for heartburn Interventions: Reviewed last bone density results Reviewed calcium intake form diet and supplements Patient self care activities - Over the next 180 days, patient will: Consider Prolia for bone health Continue to practice home safety to prevent falls.  Continue to eat calcium rich foods and take multivitamin daily - goal is to get 1200mg  of calcium from combination of foods and supplements daily  Health Maintenance  Pharmacist Clinical Goal(s) Over the next 90 days, patient will work with PharmD and providers to complete health maintenance screenings/vaccinations Interventions: Recommended COVID 19 - 2nd booster Patient self care activities - Over the next 90 days, patient will: Complete 2nd COVID 19 booster   Medication management Pharmacist Clinical Goal(s): Over the next 180 days, patient will work with PharmD and providers to maintain optimal medication adherence [facility] pharmacy: [facility] Interventions Comprehensive medication review performed. Continue current medication management strategy Patient self care activities - Over the next 180 days, patient will: Focus on medication adherence by filling and taking medications appropriately  Take medications as prescribed Report any  questions or concerns to PharmD and/or provider(s)  Please see past updates related to  this goal by clicking on the "Past Updates" button in the selected goal        Increase physical activity   On track       Patient verbalizes understanding of instructions provided today and agrees to view in [place].   Telephone follow up appointment with care management team member scheduled for: 6 months

## 2021-01-23 ENCOUNTER — Ambulatory Visit: Payer: Medicare Other | Attending: Internal Medicine

## 2021-01-23 DIAGNOSIS — Z23 Encounter for immunization: Secondary | ICD-10-CM

## 2021-01-23 NOTE — Progress Notes (Signed)
   Covid-19 Vaccination Clinic  Name:  Terri Sandoval    MRN: [id number] DOB: 07/03/1941  01/23/2021  [name] was observed post Covid-19 immunization for 15 minutes without incident. She was provided with Vaccine Information Sheet and instruction to access the V-Safe system.   [name] was instructed to call 911 with any severe reactions post vaccine: Difficulty breathing  Swelling of face and throat  A fast heartbeat  A bad rash all over body  Dizziness and weakness   Immunizations Administered     Name Date Dose VIS Date Route   PFIZER Comrnaty(Gray TOP) Covid-19 Vaccine 01/23/2021  9:57 AM 0.3 mL 06/22/2020 Intramuscular   Manufacturer: [place]   Lot: [id number]   [place]: [phone]

## 2021-01-26 ENCOUNTER — Other Ambulatory Visit (HOSPITAL_BASED_OUTPATIENT_CLINIC_OR_DEPARTMENT_OTHER): Payer: Self-pay

## 2021-01-26 MED ORDER — COVID-19 MRNA VAC-TRIS(PFIZER) 30 MCG/0.3ML IM SUSP
INTRAMUSCULAR | 0 refills | Status: DC
  Filled 2021-01-26: qty 0.3, 1d supply, fill #0

## 2021-02-05 DIAGNOSIS — L720 Epidermal cyst: Secondary | ICD-10-CM | POA: Diagnosis not present

## 2021-02-05 DIAGNOSIS — Z85828 Personal history of other malignant neoplasm of skin: Secondary | ICD-10-CM | POA: Diagnosis not present

## 2021-02-05 DIAGNOSIS — L821 Other seborrheic keratosis: Secondary | ICD-10-CM | POA: Diagnosis not present

## 2021-02-05 DIAGNOSIS — Z129 Encounter for screening for malignant neoplasm, site unspecified: Secondary | ICD-10-CM | POA: Diagnosis not present

## 2021-02-05 DIAGNOSIS — K130 Diseases of lips: Secondary | ICD-10-CM | POA: Diagnosis not present

## 2021-02-08 ENCOUNTER — Encounter: Payer: Self-pay | Admitting: Family Medicine

## 2021-03-15 ENCOUNTER — Ambulatory Visit: Payer: Medicare Other

## 2021-04-19 ENCOUNTER — Ambulatory Visit (INDEPENDENT_AMBULATORY_CARE_PROVIDER_SITE_OTHER): Payer: Medicare Other | Admitting: Family Medicine

## 2021-04-19 ENCOUNTER — Other Ambulatory Visit: Payer: Self-pay

## 2021-04-19 ENCOUNTER — Ambulatory Visit (INDEPENDENT_AMBULATORY_CARE_PROVIDER_SITE_OTHER): Payer: Medicare Other

## 2021-04-19 ENCOUNTER — Ambulatory Visit: Payer: Medicare Other

## 2021-04-19 ENCOUNTER — Encounter: Payer: Self-pay | Admitting: Family Medicine

## 2021-04-19 VITALS — BP 138/55 | HR 68 | Temp 98.4°F | Resp 16 | Ht 59.0 in | Wt 142.0 lb

## 2021-04-19 VITALS — BP 138/55 | HR 68 | Temp 98.4°F | Resp 16 | Ht 60.0 in | Wt 142.0 lb

## 2021-04-19 DIAGNOSIS — Z0000 Encounter for general adult medical examination without abnormal findings: Secondary | ICD-10-CM | POA: Diagnosis not present

## 2021-04-19 DIAGNOSIS — I10 Essential (primary) hypertension: Secondary | ICD-10-CM | POA: Diagnosis not present

## 2021-04-19 DIAGNOSIS — Z23 Encounter for immunization: Secondary | ICD-10-CM

## 2021-04-19 DIAGNOSIS — R7989 Other specified abnormal findings of blood chemistry: Secondary | ICD-10-CM

## 2021-04-19 DIAGNOSIS — E782 Mixed hyperlipidemia: Secondary | ICD-10-CM | POA: Diagnosis not present

## 2021-04-19 DIAGNOSIS — M818 Other osteoporosis without current pathological fracture: Secondary | ICD-10-CM | POA: Diagnosis not present

## 2021-04-19 DIAGNOSIS — R739 Hyperglycemia, unspecified: Secondary | ICD-10-CM

## 2021-04-19 DIAGNOSIS — E559 Vitamin D deficiency, unspecified: Secondary | ICD-10-CM

## 2021-04-19 NOTE — Progress Notes (Signed)
Subjective:   Terri Sandoval is a 80 y.o. female who presents for Medicare Annual (Subsequent) preventive examination.  Review of Systems     Cardiac Risk Factors include: advanced age (>102men, >39 women);dyslipidemia;hypertension     Objective:    Today's Vitals   04/19/21 1414  BP: (!) 138/55  Pulse: 68  Resp: 16  Temp: 98.4 F (36.9 C)  TempSrc: Oral  SpO2: 100%  Weight: 142 lb (64.4 kg)  Height: 4\' 11"  (1.499 m)   Body mass index is 28.68 kg/m.  Advanced Directives 04/19/2021 01/04/2020 07/25/2017 05/09/2016 11/07/2015 05/08/2015  Does Patient Have a Medical Advance Directive? Yes Yes Yes Yes Yes Yes  Type of Paramedic of [place];Living will [place];Living will [place];Living will [place];Living will - [place];Living will  Does patient want to make changes to medical advance directive? - No - Patient declined - - - Yes - information given  Copy of [place] in Chart? Yes - validated most recent copy scanned in chart (See row information) Yes - validated most recent copy scanned in chart (See row information) Yes Yes - No - copy requested    Current Medications (verified) Outpatient Encounter Medications as of 04/19/2021  Medication Sig   alclomethasone (ACLOVATE) 0.05 % ointment Apply topically 2 (two) times daily.   Calcium Carbonate Antacid (TUMS PO) Take 1 tablet by mouth as needed.   cetirizine (ZYRTEC) 10 MG tablet Take 10 mg by mouth daily as needed for allergies.   Cholecalciferol (VITAMIN D3) 50 MCG (2000 UT) capsule Take 1 capsule by mouth daily.   COVID-19 mRNA Vac-TriS, Pfizer, SUSP injection Inject into the muscle.   fish oil-omega-3 fatty acids 1000 MG capsule Take 1,200 mg by mouth daily.   FLUoxetine (PROZAC) 20 MG capsule TAKE 1 CAPSULE(20 MG) BY MOUTH DAILY   glucosamine-chondroitin 500-400 MG tablet Take 1 tablet by mouth 2  (two) times daily.    methylcellulose (CITRUCEL) oral powder Use as directed Daily   Multiple Vitamin (MULTIVITAMIN) tablet Take 1 tablet by mouth daily. Vitamin pack from [employer]   mupirocin ointment (BACTROBAN) 2 % 1 application 3 (three) times daily. If needed   ramipril (ALTACE) 10 MG capsule TAKE 1 CAPSULE(10 MG) BY MOUTH DAILY   simvastatin (ZOCOR) 20 MG tablet TAKE 1 TABLET(20 MG) BY MOUTH DAILY AT 6 PM   Skin Protectants, Misc. (AQUAPHOR LIP REPAIR EX) Apply topically at bedtime.   No facility-administered encounter medications on file as of 04/19/2021.    Allergies (verified) Sulfamethoxazole-trimethoprim   History: Past Medical History:  Diagnosis Date   Adrenal insufficiency ([place])    Anxiety    Arthritis    Atrophic vaginitis 11/08/2013   BPV (benign positional vertigo) 05/08/2015   Cataract    Chicken pox as a child   Colon polyps    removed during colonscopy   Diverticulitis    Diverticulosis    GERD (gastroesophageal reflux disease)    Hyperlipidemia    Hypertension    Medicare annual wellness visit, subsequent 05/16/2013   Osteoporosis, unspecified 03/09/2013   Preventative health care 11/07/2015   Skin cancer    behind ear   Urine incontinence    sometimes   [place] 11/07/2015   Vitamin D deficiency 11/07/2015   Past Surgical History:  Procedure Laterality Date   ABDOMINAL HYSTERECTOMY  97   has ovaries   BREAST BIOPSY Left     FNA  CATARACT EXTRACTION  L 09/11/10, R 10/10/10   DILATION AND CURETTAGE OF UTERUS  1990, 1993, 1997   EYE SURGERY Bilateral 10/10/2010, 07/28/17   cataracts, posterior capsulotomy b/l   TONSILLECTOMY AND ADENOIDECTOMY  1990   Family History  Problem Relation Age of Onset   Arthritis Mother    Hyperlipidemia Mother    Heart disease Mother    Hypertension Mother    Colon cancer Father    Diabetes Sister        type 2   Heart disease Sister    Hypertension Sister    Hyperlipidemia Sister    Mental illness Maternal Uncle     Stroke Maternal Grandmother    Pneumonia Maternal Grandfather    Cerebral palsy Son        MR   Colon cancer Cousin    Social History   Socioeconomic History   Marital status: Married    Spouse name: Not on file   Number of children: 2   Years of education: Not on file   Highest education level: Not on file  Occupational History   Occupation: retired  Tobacco Use   Smoking status: Never   Smokeless tobacco: Never  Vaping Use   Vaping Use: Never used  Substance and Sexual Activity   Alcohol use: No   Drug use: No   Sexual activity: Never    Comment: lives with husband and disabled son, no dietary restrictions, not exercising regularly  Other Topics Concern   Not on file  Social History Narrative   Not on file   Social Determinants of Health   Financial Resource Strain: Low Risk    Difficulty of Paying Living Expenses: Not hard at all  Food Insecurity: No Food Insecurity   Worried About Charity fundraiser in the Last Year: Never true   [place] in the Last Year: Never true  Transportation Needs: No Transportation Needs   Lack of Transportation (Medical): No   Lack of Transportation (Non-Medical): No  Physical Activity: Insufficiently Active   Days of Exercise per Week: 4 days   Minutes of Exercise per Session: 30 min  Stress: No Stress Concern Present   Feeling of Stress : Not at all  Social Connections: Moderately Integrated   Frequency of Communication with Friends and Family: Once a week   Frequency of Social Gatherings with Friends and Family: More than three times a week   Attends Religious Services: 1 to 4 times per year   Active Member of [employer] or Organizations: No   Attends Music therapist: Never   Marital Status: Married    Tobacco Counseling Counseling given: Not Answered   Clinical Intake:  Pre-visit preparation completed: Yes  Pain : No/denies pain     BMI - recorded: 28.68 Nutritional Status: BMI 25 -29  Overweight Nutritional Risks: None Diabetes: No  How often do you need to have someone help you when you read instructions, pamphlets, or other written materials from your doctor or pharmacy?: 1 - Never  Diabetic?No  Interpreter Needed?: No  Information entered by :: Orrin Brigham LPN   Activities of Daily Living In your present state of health, do you have any difficulty performing the following activities: 04/19/2021 04/19/2021  Hearing? Y N  Comment wears hearing aids in both ears occasionally -  Vision? N N  Difficulty concentrating or making decisions? N N  Walking or climbing stairs? N N  Dressing or bathing? N N  Doing errands,  shopping? N N  Preparing Food and eating ? N -  Using the Toilet? N -  In the past six months, have you accidently leaked urine? Y -  Comment when coughing, wears pad -  Do you have problems with loss of bowel control? N -  Managing your Medications? N -  Managing your Finances? N -  Housekeeping or managing your Housekeeping? [place] keeper comes 1 time per month -  Some recent data might be hidden    Patient Care Team: Mosie Lukes, MD as PCP - General (Family Medicine) Murvin Donning, MD (Dermatology) Armbruster, Carlota Raspberry, MD as Consulting Physician (Gastroenterology) Bethann Goo, [place] as Referring Physician (Chiropractic Medicine) Linward Natal, MD as Consulting Physician (Ophthalmology) [name], PharmD (Pharmacist)  Indicate any recent Medical Services you may have received from other than [facility] providers in the past year (date may be approximate).     Assessment:   This is a routine wellness examination for [place].  Hearing/Vision screen Hearing Screening - Comments:: Hearing aids in both ears, wears on occasion  Vision Screening - Comments:: Wears glasses for small print, exam last year 2021  Dietary issues and exercise activities discussed: Current Exercise Habits: Home exercise routine, Type of exercise:  Other - see comments (peddler), Time (Minutes): 30, Frequency (Times/Week): 2, Weekly Exercise (Minutes/Week): 60, Intensity: Mild, Exercise limited by: None identified   Goals Addressed   None    Depression Screen PHQ 2/9 Scores 04/19/2021 12/25/2020 06/26/2020 01/04/2020 12/04/2017 07/25/2017 05/09/2016  PHQ - 2 Score 0 0 0 0 0 0 0  PHQ- 9 Score - - 3 - - - -    Fall Risk Fall Risk  04/19/2021 12/25/2020 01/04/2020 12/04/2017 07/25/2017  Falls in the past year? 0 1 0 Yes No  Number falls in past yr: 0 1 0 1 -  Injury with Fall? 0 1 0 No -  Risk for fall due to : No Fall Risks History of fall(s) - - -  Follow up Falls prevention discussed Falls evaluation completed;Falls prevention discussed Education provided;Falls prevention discussed - -    FALL RISK PREVENTION PERTAINING TO THE HOME:  Any stairs in or around the home? No  Home free of loose throw rugs in walkways, pet beds, electrical cords, etc? Yes  Adequate lighting in your home to reduce risk of falls? Yes   ASSISTIVE DEVICES UTILIZED TO PREVENT FALLS:   Use of a cane, walker or w/c? Yes , uses cane if walking fast Grab bars in the bathroom? Yes  Shower chair or bench in shower? Yes  Elevated toilet seat or a handicapped toilet? Yes   TIMED UP AND GO:  Was the test performed? Yes .  Length of time to ambulate 10 feet: 10 sec.   Gait steady and fast without use of assistive device  Cognitive Function: Normal cognitive status assessed by direct observation by this Nurse Health Advisor. No abnormalities found.   MMSE - Mini Mental State Exam 07/25/2017 05/09/2016  Orientation to time 5 5  Orientation to Place 5 5  Registration 3 3  Attention/ Calculation 5 5  Recall 2 3  Language- name 2 objects 2 2  Language- repeat 1 1  Language- follow 3 step command 3 3  Language- read & follow direction 1 1  Write a sentence 1 1  Copy design 1 1  Total score 29 30        Immunizations Immunization History  Administered  Date(s) Administered   Fluad Quad(high Dose 65+) 05/04/2019, 04/19/2021   Influenza, High Dose Seasonal PF 05/09/2016, 05/22/2017, 05/07/2018   Influenza,inj,Quad PF,6+ Mos 04/13/2013, 04/20/2014, 05/08/2015   Influenza-Unspecified 05/04/2020   PFIZER Comirnaty(Gray Top)Covid-19 Tri-Sucrose Vaccine 01/23/2021   PFIZER(Purple Top)SARS-COV-2 Vaccination 08/03/2019, 08/24/2019, 05/29/2020   Pneumococcal Conjugate-13 11/12/2013   Pneumococcal Polysaccharide-23 04/25/2011   Tdap 01/12/2010, 09/21/2020   Zoster Recombinat (Shingrix) 06/10/2017, 08/08/2017, 09/08/2017   Zoster, Live 07/16/2011    TDAP status: Up to date  Flu Vaccine status: Up to date  Pneumococcal vaccine status: Up to date  Covid-19 vaccine status: Completed vaccines  Qualifies for Shingles Vaccine? Yes   Zostavax completed Yes   Shingrix Completed?: Yes  Screening Tests Health Maintenance  Topic Date Due   COVID-19 Vaccine (5 - Booster for Pfizer series) 05/26/2021   TETANUS/TDAP  09/22/2030   INFLUENZA VACCINE  Completed   DEXA SCAN  Completed   Zoster Vaccines- Shingrix  Completed   HPV VACCINES  Aged Out   Hepatitis C Screening  Discontinued    Health Maintenance  There are no preventive care reminders to display for this patient.   Colorectal cancer screening: Type of screening: Colonoscopy. Completed 03/15/20. Repeat every 10 years  Mammogram status: Completed 10/02/20. Repeat every year  Bone Density status: Completed 10/02/20. Results reflect: Bone density results: OSTEOPOROSIS. Repeat every 2 years.  Lung Cancer Screening: (Low Dose CT Chest recommended if Age 62-80 years, 30 pack-year currently smoking OR have quit w/in 15years.) does not qualify.    Additional Screening:  Hepatitis C Screening: does not qualify  Vision Screening: Recommended annual ophthalmology exams for early detection of glaucoma and other disorders of the eye. Is the patient up to date with their annual eye exam?  Yes   Who is the provider or what is the name of the office in which the patient attends annual eye exams? [place] Screening: Recommended annual dental exams for proper oral hygiene  Community Resource Referral / Chronic Care Management: CRR required this visit?  No   CCM required this visit?  No      Plan:     I have personally reviewed and noted the following in the patient's chart:   Medical and social history Use of alcohol, tobacco or illicit drugs  Current medications and supplements including opioid prescriptions.  Functional ability and status Nutritional status Physical activity Advanced directives List of other physicians Hospitalizations, surgeries, and ER visits in previous 12 months Vitals Screenings to include cognitive, depression, and falls Referrals and appointments  In addition, I have reviewed and discussed with patient certain preventive protocols, quality metrics, and best practice recommendations. A written personalized care plan for preventive services as well as general preventive health recommendations were provided to patient.   Patient will access AVS in my chart  Loma Messing, LPN   [id number]   Nurse Health Advisor  Nurse Notes: None

## 2021-04-19 NOTE — Patient Instructions (Addendum)
-   Lidocaine, biofreeze, icy hot, aspercreme, salonpas, CBD Daily cream online at [facility] for arm pain.  - Molnupiravir/Paxlovid is the new COVID medication we can give you if you get COVID so make sure you test if you have symptoms because we have to treat by day 5 of symptoms for it to be effective. If you are positive let us know so we can treat. If a home test is negative and your symptoms are persistent get a PCR test. Can check testing locations at [email or web address] If you are positive we will make an appointment with [place] and we will send in molnupiravir/Paxlovid if you would like it. Check with your pharmacy before we meet to confirm they have it in stock, if they do not then we can get the prescription at the [facility].

## 2021-04-19 NOTE — Patient Instructions (Addendum)
[name] , Thank you for taking time to come for your Medicare Wellness Visit. I appreciate your ongoing commitment to your health goals. Please review the following plan we discussed and let me know if I can assist you in the future.   Screening recommendations/referrals: Colonoscopy: no longer needed Mammogram: up to date, completed 10/02/20  Next due 10/02/21 Bone Density: up to date, Next due 09/2022 Recommended yearly ophthalmology/optometry visit for glaucoma screening and checkup Recommended yearly dental visit for hygiene and checkup  Vaccinations: Influenza vaccine: up to date Pneumococcal vaccine: up to date Tdap vaccine: up to date, 09/2030 Shingles vaccine: up to date   Covid-19: Will schedule with pharmacy  Advanced directives: copy on file  Conditions/risks identified: none  Next appointment: Follow up in one year for your annual wellness visit To be scheduled with follow-up visit with Dr. Charlett Blake   Preventive Care 65 Years and Older, Female Preventive care refers to lifestyle choices and visits with your health care provider that can promote health and wellness. What does preventive care include? A yearly physical exam. This is also called an annual well check. Dental exams once or twice a year. Routine eye exams. Ask your health care provider how often you should have your eyes checked. Personal lifestyle choices, including: Daily care of your teeth and gums. Regular physical activity. Eating a healthy diet. Avoiding tobacco and drug use. Limiting alcohol use. Practicing safe sex. Taking low-dose aspirin every day. Taking vitamin and mineral supplements as recommended by your health care provider. What happens during an annual well check? The services and screenings done by your health care provider during your annual well check will depend on your age, overall health, lifestyle risk factors, and family history of disease. Counseling  Your health care provider may  ask you questions about your: Alcohol use. Tobacco use. Drug use. Emotional well-being. Home and relationship well-being. Sexual activity. Eating habits. History of falls. Memory and ability to understand (cognition). Work and work Statistician. Reproductive health. Screening  You may have the following tests or measurements: Height, weight, and BMI. Blood pressure. Lipid and cholesterol levels. These may be checked every 5 years, or more frequently if you are over 57 years old. Skin check. Lung cancer screening. You may have this screening every year starting at age 80 if you have a 30-pack-year history of smoking and currently smoke or have quit within the past 15 years. Fecal occult blood test (FOBT) of the stool. You may have this test every year starting at age 80. Flexible sigmoidoscopy or colonoscopy. You may have a sigmoidoscopy every 5 years or a colonoscopy every 10 years starting at age 70. Hepatitis C blood test. Hepatitis B blood test. Sexually transmitted disease (STD) testing. Diabetes screening. This is done by checking your blood sugar (glucose) after you have not eaten for a while (fasting). You may have this done every 1-3 years. Bone density scan. This is done to screen for osteoporosis. You may have this done starting at age 80. Mammogram. This may be done every 1-2 years. Talk to your health care provider about how often you should have regular mammograms. Talk with your health care provider about your test results, treatment options, and if necessary, the need for more tests. Vaccines  Your health care provider may recommend certain vaccines, such as: Influenza vaccine. This is recommended every year. Tetanus, diphtheria, and acellular pertussis (Tdap, Td) vaccine. You may need a Td booster every 10 years. Zoster vaccine. You may need this  after age 73. Pneumococcal 13-valent conjugate (PCV13) vaccine. One dose is recommended after age 38. Pneumococcal  polysaccharide (PPSV23) vaccine. One dose is recommended after age 41. Talk to your health care provider about which screenings and vaccines you need and how often you need them. This information is not intended to replace advice given to you by your health care provider. Make sure you discuss any questions you have with your health care provider. Document Released: 07/28/2015 Document Revised: 03/20/2016 Document Reviewed: 05/02/2015 Elsevier Interactive Patient Education  2017 [place] Prevention in the Home Falls can cause injuries. They can happen to people of all ages. There are many things you can do to make your home safe and to help prevent falls. What can I do on the outside of my home? Regularly fix the edges of walkways and driveways and fix any cracks. Remove anything that might make you trip as you walk through a door, such as a raised step or threshold. Trim any bushes or trees on the path to your home. Use bright outdoor lighting. Clear any walking paths of anything that might make someone trip, such as rocks or tools. Regularly check to see if handrails are loose or broken. Make sure that both sides of any steps have handrails. Any raised decks and porches should have guardrails on the edges. Have any leaves, snow, or ice cleared regularly. Use sand or salt on walking paths during winter. Clean up any spills in your garage right away. This includes oil or grease spills. What can I do in the bathroom? Use night lights. Install grab bars by the toilet and in the tub and shower. Do not use towel bars as grab bars. Use non-skid mats or decals in the tub or shower. If you need to sit down in the shower, use a plastic, non-slip stool. Keep the floor dry. Clean up any water that spills on the floor as soon as it happens. Remove soap buildup in the tub or shower regularly. Attach bath mats securely with double-sided non-slip rug tape. Do not have throw rugs and other  things on the floor that can make you trip. What can I do in the bedroom? Use night lights. Make sure that you have a light by your bed that is easy to reach. Do not use any sheets or blankets that are too big for your bed. They should not hang down onto the floor. Have a firm chair that has side arms. You can use this for support while you get dressed. Do not have throw rugs and other things on the floor that can make you trip. What can I do in the kitchen? Clean up any spills right away. Avoid walking on wet floors. Keep items that you use a lot in easy-to-reach places. If you need to reach something above you, use a strong step stool that has a grab bar. Keep electrical cords out of the way. Do not use floor polish or wax that makes floors slippery. If you must use wax, use non-skid floor wax. Do not have throw rugs and other things on the floor that can make you trip. What can I do with my stairs? Do not leave any items on the stairs. Make sure that there are handrails on both sides of the stairs and use them. Fix handrails that are broken or loose. Make sure that handrails are as long as the stairways. Check any carpeting to make sure that it is firmly attached to the  stairs. Fix any carpet that is loose or worn. Avoid having throw rugs at the top or bottom of the stairs. If you do have throw rugs, attach them to the floor with carpet tape. Make sure that you have a light switch at the top of the stairs and the bottom of the stairs. If you do not have them, ask someone to add them for you. What else can I do to help prevent falls? Wear shoes that: Do not have high heels. Have rubber bottoms. Are comfortable and fit you well. Are closed at the toe. Do not wear sandals. If you use a stepladder: Make sure that it is fully opened. Do not climb a closed stepladder. Make sure that both sides of the stepladder are locked into place. Ask someone to hold it for you, if possible. Clearly  mark and make sure that you can see: Any grab bars or handrails. First and last steps. Where the edge of each step is. Use tools that help you move around (mobility aids) if they are needed. These include: Canes. Walkers. Scooters. Crutches. Turn on the lights when you go into a dark area. Replace any light bulbs as soon as they burn out. Set up your furniture so you have a clear path. Avoid moving your furniture around. If any of your floors are uneven, fix them. If there are any pets around you, be aware of where they are. Review your medicines with your doctor. Some medicines can make you feel dizzy. This can increase your chance of falling. Ask your doctor what other things that you can do to help prevent falls. This information is not intended to replace advice given to you by your health care provider. Make sure you discuss any questions you have with your health care provider. Document Released: 04/27/2009 Document Revised: 12/07/2015 Document Reviewed: 08/05/2014 Elsevier Interactive Patient Education  2017 [employer].

## 2021-04-19 NOTE — Progress Notes (Signed)
Patient ID: Terri Sandoval, female    DOB: 06-09-1941  Age: 80 y.o. MRN: [id number]    Subjective:   Chief Complaint  Patient presents with   Hypertension    Here for follow up   Vitamin D deficiency   Subjective   HPI Terri Sandoval presents for office visit today for follow up on HTN and GERD. She is doing well and has no febrile illnesses or recent ER visits to report. Her BP readings have been fine at home and denies CP/palp/SOB/HA/congestion/fevers/GI or GU c/o. Taking meds as prescribed. She feels pain in her right arm when laying on bed that does not change if laying on right arm or not. She gets a bulging sensation on her bilateral ankles containing veracious veins that is prominent when standing. She elevates her LE's when going to bed.   Review of Systems  Constitutional:  Negative for chills, fatigue and fever.  HENT:  Negative for congestion, rhinorrhea, sinus pressure, sinus pain and sore throat.   Eyes:  Negative for pain.  Respiratory:  Negative for cough and shortness of breath.   Cardiovascular:  Negative for chest pain, palpitations and leg swelling.  Gastrointestinal:  Negative for abdominal pain, blood in stool, diarrhea, nausea and vomiting.  Genitourinary:  Negative for decreased urine volume, flank pain, frequency, vaginal bleeding and vaginal discharge.  Musculoskeletal:  Negative for back pain.  Neurological:  Negative for headaches.   History Past Medical History:  Diagnosis Date   Adrenal insufficiency ([place])    Anxiety    Arthritis    Atrophic vaginitis 11/08/2013   BPV (benign positional vertigo) 05/08/2015   Cataract    Chicken pox as a child   Colon polyps    removed during colonscopy   Diverticulitis    Diverticulosis    GERD (gastroesophageal reflux disease)    Hyperlipidemia    Hypertension    Medicare annual wellness visit, subsequent 05/16/2013   Osteoporosis, unspecified 03/09/2013   Preventative health care 11/07/2015   Skin cancer     behind ear   Urine incontinence    sometimes   [place] 11/07/2015   Vitamin D deficiency 11/07/2015    She has a past surgical history that includes Abdominal hysterectomy (97); Tonsillectomy and adenoidectomy (1990); Dilation and curettage of uterus (1990, 1993, 1997); Breast biopsy (Left); Cataract extraction (L 09/11/10, R 10/10/10); and Eye surgery (Bilateral, 10/10/2010, 07/28/17).   Her family history includes Arthritis in her mother; Cerebral palsy in her son; Colon cancer in her cousin and father; Diabetes in her sister; Heart disease in her mother and sister; Hyperlipidemia in her mother and sister; Hypertension in her mother and sister; Mental illness in her maternal uncle; Pneumonia in her maternal grandfather; Stroke in her maternal grandmother.She reports that she has never smoked. She has never used smokeless tobacco. She reports that she does not drink alcohol and does not use drugs.  Current Outpatient Medications on File Prior to Visit  Medication Sig Dispense Refill   alclomethasone (ACLOVATE) 0.05 % ointment Apply topically 2 (two) times daily.     Calcium Carbonate Antacid (TUMS PO) Take 1 tablet by mouth as needed.     cetirizine (ZYRTEC) 10 MG tablet Take 10 mg by mouth daily as needed for allergies.     Cholecalciferol (VITAMIN D3) 50 MCG (2000 UT) capsule Take 1 capsule by mouth daily.     COVID-19 mRNA Vac-TriS, Pfizer, SUSP injection Inject into the muscle. 0.3 mL 0   fish oil-omega-3 fatty  acids 1000 MG capsule Take 1,200 mg by mouth daily.     FLUoxetine (PROZAC) 20 MG capsule TAKE 1 CAPSULE(20 MG) BY MOUTH DAILY 90 capsule 1   glucosamine-chondroitin 500-400 MG tablet Take 1 tablet by mouth 2 (two) times daily.      methylcellulose (CITRUCEL) oral powder Use as directed Daily     Multiple Vitamin (MULTIVITAMIN) tablet Take 1 tablet by mouth daily. Vitamin pack from [employer]     mupirocin ointment (BACTROBAN) 2 % 1 application 3 (three) times daily. If needed     ramipril  (ALTACE) 10 MG capsule TAKE 1 CAPSULE(10 MG) BY MOUTH DAILY 90 capsule 1   simvastatin (ZOCOR) 20 MG tablet TAKE 1 TABLET(20 MG) BY MOUTH DAILY AT 6 PM 90 tablet 1   Skin Protectants, Misc. (AQUAPHOR LIP REPAIR EX) Apply topically at bedtime.     No current facility-administered medications on file prior to visit.     Objective:  Objective  Physical Exam Constitutional:      General: She is not in acute distress.    Appearance: Normal appearance. She is not ill-appearing or toxic-appearing.  HENT:     Head: Normocephalic and atraumatic.     Right Ear: Tympanic membrane, ear canal and external ear normal.     Left Ear: Tympanic membrane, ear canal and external ear normal.     Nose: No congestion or rhinorrhea.  Eyes:     Extraocular Movements: Extraocular movements intact.     Pupils: Pupils are equal, round, and reactive to light.  Cardiovascular:     Rate and Rhythm: Normal rate and regular rhythm.     Pulses: Normal pulses.     Heart sounds: Normal heart sounds. No murmur heard. Pulmonary:     Effort: Pulmonary effort is normal. No respiratory distress.     Breath sounds: Normal breath sounds. No wheezing, rhonchi or rales.  Abdominal:     General: Bowel sounds are normal.     Palpations: Abdomen is soft. There is no mass.     Tenderness: There is no abdominal tenderness. There is no guarding.     Hernia: No hernia is present.  Musculoskeletal:        General: Normal range of motion.     Cervical back: Normal range of motion and neck supple.  Skin:    General: Skin is warm and dry.  Neurological:     Mental Status: She is alert and oriented to person, place, and time.  Psychiatric:        Behavior: Behavior normal.   BP (!) 138/55 (BP Location: Right Arm, Patient Position: Sitting, Cuff Size: Small)   Pulse 68   Temp 98.4 F (36.9 C) (Oral)   Resp 16   Ht 5' (1.524 m)   Wt 142 lb (64.4 kg)   SpO2 100%   BMI 27.73 kg/m  Wt Readings from Last 3 Encounters:   04/19/21 142 lb (64.4 kg)  04/19/21 142 lb (64.4 kg)  11/02/20 141 lb 9.6 oz (64.2 kg)     Lab Results  Component Value Date   WBC 5.0 04/19/2021   HGB 13.4 04/19/2021   HCT 39.8 04/19/2021   PLT 222.0 04/19/2021   GLUCOSE 103 (H) 04/19/2021   CHOL 158 04/19/2021   TRIG 181.0 (H) 04/19/2021   HDL 68.80 04/19/2021   LDLCALC 53 04/19/2021   ALT 15 04/19/2021   AST 21 04/19/2021   NA 139 04/19/2021   K 4.2 04/19/2021   CL 100  04/19/2021   CREATININE 0.84 04/19/2021   BUN 16 04/19/2021   CO2 32 04/19/2021   TSH 3.82 04/19/2021   HGBA1C 6.1 04/19/2021   MICROALBUR <0.7 05/04/2018    DG Bone Density  Result Date: 10/02/2020 EXAM: DUAL X-RAY ABSORPTIOMETRY (DXA) FOR BONE MINERAL DENSITY IMPRESSION: Terri Sandoval Your patient Terri Sandoval completed a BMD test on 10/02/2020 using the [place] (analysis version: 16.SP2) manufactured by [employer]. The following summarizes the results of our evaluation. [place] PATIENT: Name: Terri Sandoval, Terri Sandoval Patient ID: [id number] Birth Date: 1940-10-26 Height: 59.0 in. Gender: Female Measured: 10/02/2020 Weight: 141.0 lbs. Indications: Advanced Age, Caucasian, Estrogen Deficiency, Family Hx of Osteoporosis, History of Osteoporosis, Hysterectomy, Post Menopausal Fractures: Pelvis, Ribs, Wrist Treatments: Calcium, Fosamax(Alendronate), Multivitamin, Vitamin D ASSESSMENT: The BMD measured at Femur Neck Left is 0.651 g/cm2 with a T-score of -2.8. This patient is considered osteoporotic according to [place] [facility]) criteria. Lumbar spine was excluded due to degenerative changes. Compared with the prior study on, 111/01/2015 the BMD of the total mean shows no statistically signficant change. Site Region Measured Date Measured Age WHO YA BMD Classification T-score DualFemur Neck Left 10/02/2020 79.2 Osteoporosis -2.8 0.651 g/cm2 DualFemur Neck Left 05/22/2015 73.8 Osteoporosis -2.6 0.672 g/cm2 DualFemur Total Mean 10/02/2020 79.2 Osteopenia  -2.0 0.751 g/cm2 DualFemur Total Mean 05/22/2015 73.8 Osteopenia -2.1 0.739 g/cm2 Left Forearm Radius 33% 10/02/2020 79.2 Osteopenia -1.6 0.732 g/cm2 World Health Organization [facility]) criteria for post-menopausal, Caucasian Women: Normal       T-score at or above -1 SD Osteopenia   T-score between -1 and -2.5 SD Osteoporosis T-score at or below -2.5 SD RECOMMENDATION: 1. All patients should optimize calcium and vitamin D intake. 2. Consider FDA-approved medical therapies in postmenopausal women and men aged 16 years and older, based on the following: a. A hip or vertebral(clinical or morphometric) fracture. b. T-Score < -2.5 at the femoral neck or spine after appropriate evaluation to exclude secondary causes c. Low bone mass (T-score between -1.0 and -2.5 at the femoral neck or spine) and a 10 year probability of a hip fracture >3% or a 10 year probability of major osteoporosis-related fracture > 20% based on the [place]-adapted WHO algorithm d. Clinical judgement and/or patient preferences may indicate treatment for people with 10-year fracture probabilities above or below these levels FOLLOW-UP: Patients with diagnosis of osteoporosis or at high risk for fracture should have regular bone mineral density tests. For patients eligible for Medicare, routine testing is allowed once every 2 years. The testing frequency can be increased to one year for patients who have rapidly progressing disease, those who are receiving or discontinuing medical therapy to restore bone mass, or have additional risk factors. I have reviewed this report, and agree with the above findings. [facility] Electronically Signed   By: Lowella Grip III M.D.   On: 10/02/2020 14:22   MM 3D SCREEN BREAST BILATERAL  Result Date: 10/03/2020 CLINICAL DATA:  Screening. EXAM: DIGITAL SCREENING BILATERAL MAMMOGRAM WITH TOMOSYNTHESIS AND CAD TECHNIQUE: Bilateral screening digital craniocaudal and mediolateral oblique mammograms were obtained.  Bilateral screening digital breast tomosynthesis was performed. The images were evaluated with computer-aided detection. COMPARISON:  Previous exam(s). ACR Breast Density Category c: The breast tissue is heterogeneously dense, which may obscure small masses. FINDINGS: There are no findings suspicious for malignancy. The images were evaluated with computer-aided detection. IMPRESSION: No mammographic evidence of malignancy. A result letter of this screening mammogram will be mailed directly to the patient. RECOMMENDATION: Screening  mammogram in one year. (Code:SM-B-01Y) BI-RADS CATEGORY  1: Negative. Electronically Signed   By: Lajean Manes M.D.   On: 10/03/2020 08:52     Assessment & Plan:  Plan    No orders of the defined types were placed in this encounter.   Problem List Items Addressed This Visit     Hypertension    Well controlled, no changes to meds. Encouraged heart healthy diet such as the DASH diet and exercise as tolerated.       Relevant Orders   CBC (Completed)   TSH (Completed)   T4, free (Completed)   Hyperlipidemia    Encourage heart healthy diet such as MIND or DASH diet, increase exercise, avoid trans fats, simple carbohydrates and processed foods, consider a krill or fish or flaxseed oil cap daily.       Relevant Orders   Lipid panel (Completed)   TSH (Completed)   T4, free (Completed)   Osteoporosis    Encouraged to get adequate exercise, calcium and vitamin d intake      Relevant Orders   Comprehensive metabolic panel (Completed)   Vitamin D deficiency    Supplement and monitor      Relevant Orders   Comprehensive metabolic panel (Completed)   VITAMIN D 25 Hydroxy (Vit-D Deficiency, Fractures) (Completed)   Hyperglycemia    hgba1c acceptable, minimize simple carbs. Increase exercise as tolerated.       Relevant Orders   Hemoglobin A1c (Completed)   Abnormal TSH   Relevant Orders   TSH (Completed)   T4, free (Completed)   Other Visit Diagnoses      Needs flu shot    -  Primary   Relevant Orders   Flu Vaccine QUAD High Dose(Fluad) (Completed)       Follow-up: No follow-ups on file.  I, Suezanne Jacquet, acting as a scribe for Penni Homans, MD, have documented all relevent documentation on behalf of Penni Homans, MD, as directed by Penni Homans, MD while in the presence of Penni Homans, MD. DO:04/22/21.  I, Mosie Lukes, MD personally performed the services described in this documentation. All medical record entries made by the scribe were at my direction and in my presence. I have reviewed the chart and agree that the record reflects my personal performance and is accurate and complete

## 2021-04-20 LAB — CBC
HCT: 39.8 % (ref 36.0–46.0)
Hemoglobin: 13.4 g/dL (ref 12.0–15.0)
MCHC: 33.7 g/dL (ref 30.0–36.0)
MCV: 89.5 fl (ref 78.0–100.0)
Platelets: 222 10*3/uL (ref 150.0–400.0)
RBC: 4.44 Mil/uL (ref 3.87–5.11)
RDW: 14.1 % (ref 11.5–15.5)
WBC: 5 10*3/uL (ref 4.0–10.5)

## 2021-04-20 LAB — COMPREHENSIVE METABOLIC PANEL
ALT: 15 U/L (ref 0–35)
AST: 21 U/L (ref 0–37)
Albumin: 4.2 g/dL (ref 3.5–5.2)
Alkaline Phosphatase: 63 U/L (ref 39–117)
BUN: 16 mg/dL (ref 6–23)
CO2: 32 mEq/L (ref 19–32)
Calcium: 9.5 mg/dL (ref 8.4–10.5)
Chloride: 100 mEq/L (ref 96–112)
Creatinine, Ser: 0.84 mg/dL (ref 0.40–1.20)
GFR: 65.93 mL/min (ref >60.00)
Glucose, Bld: 103 mg/dL — ABNORMAL HIGH (ref 70–99)
Potassium: 4.2 mEq/L (ref 3.5–5.1)
Sodium: 139 mEq/L (ref 135–145)
Total Bilirubin: 0.3 mg/dL (ref 0.2–1.2)
Total Protein: 6.9 g/dL (ref 6.0–8.3)

## 2021-04-20 LAB — LIPID PANEL
Cholesterol: 158 mg/dL (ref 0–200)
HDL: 68.8 mg/dL (ref >39.00)
LDL Cholesterol: 53 mg/dL (ref 0–99)
NonHDL: 89.45
Total CHOL/HDL Ratio: 2
Triglycerides: 181 mg/dL — ABNORMAL HIGH (ref 0.0–149.0)
VLDL: 36.2 mg/dL (ref 0.0–40.0)

## 2021-04-20 LAB — T4, FREE: Free T4: 0.72 ng/dL (ref 0.60–1.60)

## 2021-04-20 LAB — TSH: TSH: 3.82 u[IU]/mL (ref 0.35–5.50)

## 2021-04-20 LAB — VITAMIN D 25 HYDROXY (VIT D DEFICIENCY, FRACTURES): VITD: 48.45 ng/mL (ref 30.00–100.00)

## 2021-04-20 LAB — HEMOGLOBIN A1C: Hgb A1c MFr Bld: 6.1 % (ref 4.6–6.5)

## 2021-04-22 NOTE — Assessment & Plan Note (Signed)
Encouraged to get adequate exercise, calcium and vitamin d intake 

## 2021-04-22 NOTE — Assessment & Plan Note (Signed)
Supplement and monitor 

## 2021-04-22 NOTE — Assessment & Plan Note (Signed)
hgba1c acceptable, minimize simple carbs. Increase exercise as tolerated.  

## 2021-04-22 NOTE — Assessment & Plan Note (Signed)
Encourage heart healthy diet such as MIND or DASH diet, increase exercise, avoid trans fats, simple carbohydrates and processed foods, consider a krill or fish or flaxseed oil cap daily.  °

## 2021-04-22 NOTE — Assessment & Plan Note (Signed)
Well controlled, no changes to meds. Encouraged heart healthy diet such as the DASH diet and exercise as tolerated.  °

## 2021-04-24 DIAGNOSIS — H43811 Vitreous degeneration, right eye: Secondary | ICD-10-CM | POA: Diagnosis not present

## 2021-04-24 DIAGNOSIS — H02831 Dermatochalasis of right upper eyelid: Secondary | ICD-10-CM | POA: Diagnosis not present

## 2021-04-24 DIAGNOSIS — H43393 Other vitreous opacities, bilateral: Secondary | ICD-10-CM | POA: Diagnosis not present

## 2021-04-24 DIAGNOSIS — H5202 Hypermetropia, left eye: Secondary | ICD-10-CM | POA: Diagnosis not present

## 2021-04-24 DIAGNOSIS — H02834 Dermatochalasis of left upper eyelid: Secondary | ICD-10-CM | POA: Diagnosis not present

## 2021-04-24 DIAGNOSIS — H04123 Dry eye syndrome of bilateral lacrimal glands: Secondary | ICD-10-CM | POA: Diagnosis not present

## 2021-04-24 DIAGNOSIS — H35372 Puckering of macula, left eye: Secondary | ICD-10-CM | POA: Diagnosis not present

## 2021-04-24 DIAGNOSIS — H40003 Preglaucoma, unspecified, bilateral: Secondary | ICD-10-CM | POA: Diagnosis not present

## 2021-04-24 DIAGNOSIS — H0100B Unspecified blepharitis left eye, upper and lower eyelids: Secondary | ICD-10-CM | POA: Diagnosis not present

## 2021-04-24 DIAGNOSIS — H52203 Unspecified astigmatism, bilateral: Secondary | ICD-10-CM | POA: Diagnosis not present

## 2021-04-24 DIAGNOSIS — H0100A Unspecified blepharitis right eye, upper and lower eyelids: Secondary | ICD-10-CM | POA: Diagnosis not present

## 2021-04-24 DIAGNOSIS — H524 Presbyopia: Secondary | ICD-10-CM | POA: Diagnosis not present

## 2021-05-22 ENCOUNTER — Other Ambulatory Visit: Payer: Self-pay | Admitting: Family Medicine

## 2021-05-30 ENCOUNTER — Ambulatory Visit: Payer: Medicare Other | Attending: Internal Medicine

## 2021-05-30 DIAGNOSIS — Z23 Encounter for immunization: Secondary | ICD-10-CM

## 2021-05-30 NOTE — Progress Notes (Signed)
   Covid-19 Vaccination Clinic  Name:  Terri Sandoval    MRN: [id number] DOB: 11-23-1940  05/30/2021  [name] was observed post Covid-19 immunization for 15 minutes without incident. She was provided with Vaccine Information Sheet and instruction to access the V-Safe system.   [name] was instructed to call 911 with any severe reactions post vaccine: Difficulty breathing  Swelling of face and throat  A fast heartbeat  A bad rash all over body  Dizziness and weakness   Immunizations Administered     Name Date Dose VIS Date Route   Pfizer Covid-19 Vaccine Bivalent Booster 05/30/2021 10:13 AM 0.3 mL 03/14/2021 Intramuscular   Manufacturer: [place]   Lot: [id number]   [place]: [phone]

## 2021-06-15 ENCOUNTER — Other Ambulatory Visit (HOSPITAL_BASED_OUTPATIENT_CLINIC_OR_DEPARTMENT_OTHER): Payer: Self-pay

## 2021-06-15 MED ORDER — PFIZER COVID-19 VAC BIVALENT 30 MCG/0.3ML IM SUSP
INTRAMUSCULAR | 0 refills | Status: DC
  Filled 2021-06-15: qty 0.3, 1d supply, fill #0

## 2021-06-25 ENCOUNTER — Ambulatory Visit: Payer: Medicare Other | Admitting: Pharmacist

## 2021-06-25 VITALS — BP 114/70

## 2021-06-25 DIAGNOSIS — M818 Other osteoporosis without current pathological fracture: Secondary | ICD-10-CM

## 2021-06-25 DIAGNOSIS — F419 Anxiety disorder, unspecified: Secondary | ICD-10-CM

## 2021-06-25 DIAGNOSIS — K219 Gastro-esophageal reflux disease without esophagitis: Secondary | ICD-10-CM

## 2021-06-25 DIAGNOSIS — I10 Essential (primary) hypertension: Secondary | ICD-10-CM

## 2021-06-25 DIAGNOSIS — E559 Vitamin D deficiency, unspecified: Secondary | ICD-10-CM

## 2021-06-25 DIAGNOSIS — E782 Mixed hyperlipidemia: Secondary | ICD-10-CM

## 2021-06-25 NOTE — Patient Instructions (Addendum)
Terri Sandoval,  It was a pleasure speaking with you today.  I have attached a summary of our visit today and information about your health goals.   Our next appointment is by telephone on December 24, 2021 at 10:00am  Please call the care guide team at [phone] if you need to cancel or reschedule your appointment.    If you have any questions or concerns, please feel free to contact me either at the phone number below or with a MyChart message.   Keep up the good work!  [name], PharmD Clinical Pharmacist [facility] [phone] (direct line)  [phone] (main office number)  CARE PLAN     Hypertension BP Readings from Last 3 Encounters:  06/25/2021 114/70  04/19/21 (!) 138/55  11/02/20 106/70   Pharmacist Clinical Goal(s): Over the next 180 days, patient will work with PharmD and providers to maintain BP goal <140/90 Current regimen:  Ramipiril 10mg  daily Interventions: Discussed blood pressure goal Patient self care activities - Over the next 180 days, patient will: Maintain current hypertension medication regimen.   Hyperlipidemia Lab Results  Component Value Date/Time   LDLCALC 53 04/19/2021 02:10 PM   LDLCALC 76 05/16/2020 08:12 AM   Pharmacist Clinical Goal(s): Over the next 180 days, patient will work with PharmD and providers to maintain LDL goal < 100 Current regimen:  Simvastatin 20mg  daily Fish Oil 1200mg  daily Interventions: Discussed LDL goal Patient self care activities - Over the next 180 days, patient will: Maintain current cholesterol medication regimen.   Pre-Diabetes Lab Results  Component Value Date/Time   HGBA1C 6.1 04/19/2021 02:10 PM   HGBA1C 6.1 11/02/2020 11:26 AM   Pharmacist Clinical Goal(s): Over the next 180 days, patient will work with PharmD and providers to maintain A1c goal <6.5% Current regimen:  Diet and exercise management   Interventions: Discussed A1c goal Discussed diet and  exercie Patient self care activities - Over the next 180 days, patient will: Maintain a1c less than 6.5% Continue to limit intake of sugar containing foods and beverages Continue to use [employer] exerciser - goal si to get at least 150 minutes of exercise per week.   Osteoporosis Pharmacist Clinical Goal(s) Over the next 180 days, patient will work with PharmD and providers to reduce risk of fracture due to osteoporosis Current regimen:  Vitamin D 2000 units daily Multi-vitamin  with calcium daily Tums if needed for heartburn Interventions: Reviewed last bone density results Reviewed calcium intake form diet and supplements Patient self care activities - Over the next 180 days, patient will: Continue to practice home safety to prevent falls.  Continue to eat calcium rich foods and take multivitamin daily - goal is to get 1200mg  of calcium from combination of foods and supplements daily   Medication management Pharmacist Clinical Goal(s): Over the next 180 days, patient will work with PharmD and providers to maintain optimal medication adherence Current pharmacy: [facility] Interventions Comprehensive medication review performed. Continue current medication management strategy Patient self care activities - Over the next 180 days, patient will: Focus on medication adherence by filling and taking medications appropriately  Take medications as prescribed Report any questions or concerns to PharmD and/or provider(s) OK to try AZO Bladder Control for night time urinary frequency   Patient verbalizes understanding of instructions provided today and agrees to view in MyChart.

## 2021-06-25 NOTE — Chronic Care Management (AMB) (Signed)
Chronic Care Management Pharmacy Note  06/25/2021 Name:  Terri Sandoval MRN:  [id number] DOB:  03-14-1941  Summary:    Recommendations/Changes made from today's visit:    Subjective: Terri Sandoval is an 80 y.o. year old female who is a primary patient of Mosie Lukes, MD.  The CCM team was consulted for assistance with disease management and care coordination needs.    Engaged with patient by telephone for follow up visit in response to provider referral for pharmacy case management and/or care coordination services.   Consent to Services:  The patient was given information about Chronic Care Management services, agreed to services, and gave verbal consent prior to initiation of services.  Please see initial visit note for detailed documentation.   Patient Care Team: Mosie Lukes, MD as PCP - General (Family Medicine) Armbruster, Carlota Raspberry, MD as Consulting Physician (Gastroenterology) [name], RPH-CPP (Pharmacist) Tsamis, Constance Haw, MD as Referring Physician (Ophthalmology) Susie Cassette, Katharine Look, MD as Referring Physician (Dermatology)  Recent office visits: 04/19/2021 - PCP (Dr Charlett Blake) F/U chronic medical conditions; no medication changes noted.  11/02/2020 - PCP (Dr Charlett Blake) F/U chronic medical concerns; no medication changes.   Recent consult visits: 04/24/2021 - ophthalmology (Dr Deatra Ina - [facility]) F/U epiretinal membrane / Blepharitis of both upper and lower eyelid  / Dry eye syndrome of both lacrimal glands / Dermatochalasis of both upper eyelids / PVD (posterior vitreous detachment), right eye /  Vitreous floater, bilateral / Glaucoma suspect of both eyes / Astigmatism with presbyopia, bilateral; Recommended eye lid hygiene, continue to use dry eye drops.  Return in about 4 months (around 08/25/2021) for Glaucoma eval, IOP check.   Hospital visits: None in previous 6 months  Objective:  Lab Results  Component Value Date   CREATININE 0.84  04/19/2021   CREATININE 0.86 11/02/2020   CREATININE 0.84 05/16/2020    Lab Results  Component Value Date   HGBA1C 6.1 04/19/2021   Last diabetic Eye exam: No results found for: HMDIABEYEEXA  Last diabetic Foot exam: No results found for: HMDIABFOOTEX      Component Value Date/Time   CHOL 158 04/19/2021 1410   TRIG 181.0 (H) 04/19/2021 1410   HDL 68.80 04/19/2021 1410   CHOLHDL 2 04/19/2021 1410   VLDL 36.2 04/19/2021 1410   LDLCALC 53 04/19/2021 1410   LDLCALC 76 05/16/2020 0812    Hepatic Function Latest Ref Rng & Units 04/19/2021 11/02/2020 05/16/2020  Total Protein 6.0 - 8.3 g/dL 6.9 7.0 6.6  Albumin 3.5 - 5.2 g/dL 4.2 4.2 -  AST 0 - 37 U/L 21 22 21   ALT 0 - 35 U/L 15 19 17   Alk Phosphatase 39 - 117 U/L 63 63 -  Total Bilirubin 0.2 - 1.2 mg/dL 0.3 0.4 0.5  Bilirubin, Direct 0.0 - 0.3 mg/dL - - -    Lab Results  Component Value Date/Time   TSH 3.82 04/19/2021 02:10 PM   TSH 3.34 11/02/2020 11:26 AM   FREET4 0.72 04/19/2021 02:10 PM   FREET4 0.69 11/02/2020 11:26 AM    CBC Latest Ref Rng & Units 04/19/2021 11/02/2020 05/16/2020  WBC 4.0 - 10.5 K/uL 5.0 5.5 5.4  Hemoglobin 12.0 - 15.0 g/dL 13.4 13.7 12.8  Hematocrit 36.0 - 46.0 % 39.8 41.6 39.7  Platelets 150.0 - 400.0 K/uL 222.0 231.0 238    Lab Results  Component Value Date/Time   VD25OH 48.45 04/19/2021 02:10 PM   VD25OH 54.68 11/02/2020 11:26 AM  Clinical ASCVD: No  The 10-year ASCVD risk score (Arnett DK, et al., 2019) is: 26.8%   Values used to calculate the score:     Age: 35 years     Sex: Female     Is Non-Hispanic African American: No     Diabetic: No     Tobacco smoker: No     Systolic Blood Pressure: 161 mmHg     Is BP treated: Yes     HDL Cholesterol: 68.8 mg/dL     Total Cholesterol: 158 mg/dL    Other: See care plan for DEXA  Depression screen [facility] 2/9 04/19/2021 12/25/2020 06/26/2020  Decreased Interest 0 0 0  Down, Depressed, Hopeless 0 0 -  PHQ - 2 Score 0 0 0  Altered sleeping - -  0  Tired, decreased energy - - 1  Change in appetite - - 0  Feeling bad or failure about yourself  - - 0  Trouble concentrating - - 1  Moving slowly or fidgety/restless - - 1  Suicidal thoughts - - 0  PHQ-9 Score - - 3     Social History   Tobacco Use  Smoking Status Never  Smokeless Tobacco Never   BP Readings from Last 3 Encounters:  06/25/21 114/70  04/19/21 (!) 138/55  04/19/21 (!) 138/55   Pulse Readings from Last 3 Encounters:  04/19/21 68  04/19/21 68  11/02/20 72   Wt Readings from Last 3 Encounters:  04/19/21 142 lb (64.4 kg)  04/19/21 142 lb (64.4 kg)  11/02/20 141 lb 9.6 oz (64.2 kg)    Assessment: Review of patient past medical history, allergies, medications, health status, including review of consultants reports, laboratory and other test data, was performed as part of comprehensive evaluation and provision of chronic care management services.   SDOH:  (Social Determinants of Health) assessments and interventions performed:  SDOH Interventions    Flowsheet Row Most Recent Value  SDOH Interventions   Intimate Partner Violence Interventions Intervention Not Indicated  Physical Activity Interventions Intervention Not Indicated        CCM Care Plan  Allergies  Allergen Reactions   Sulfamethoxazole-Trimethoprim Other (See Comments)    Very sore, sore in corner of lip    Medications Reviewed Today     Reviewed by [name], RPH-CPP (Pharmacist) on 06/25/21 at 26  Med List Status: <None>   Medication Order Taking? Sig Documenting Provider Last Dose Status Informant  alclomethasone (ACLOVATE) 0.05 % ointment [id number] Yes Apply topically 2 (two) times daily. [provider] Taking Active   Calcium Carbonate Antacid (TUMS PO) [id number] Yes Take 1 tablet by mouth as needed. [provider] Taking Active   cetirizine (ZYRTEC) 10 MG tablet [id number] Yes Take 10 mg by mouth daily as needed for allergies. [provider]  Taking Active   Cholecalciferol (VITAMIN D3) 50 MCG (2000 UT) capsule [id number] Yes Take 1 capsule by mouth daily. [provider] Taking Active   fish oil-omega-3 fatty acids 1000 MG capsule [id number] Yes Take 1,200 mg by mouth daily. [provider] Taking Active   FLUoxetine (PROZAC) 20 MG capsule [id number] Yes TAKE 1 CAPSULE(20 MG) BY MOUTH DAILY Mosie Lukes, MD Taking Active   glucosamine-chondroitin 500-400 MG tablet [id number] Yes Take 1 tablet by mouth 2 (two) times daily.  [provider] Taking Active   methylcellulose (CITRUCEL) oral powder [id number] Yes Use as directed Daily  Patient taking differently: Use as directed Daily if needed   Armbruster, Carlota Raspberry,  MD Taking Active   Multiple Vitamin (MULTIVITAMIN) tablet [id number] Yes Take 1 tablet by mouth daily. Vitamin pack from [facility] [provider] Taking Active   mupirocin ointment (BACTROBAN) 2 % [id number] Yes 1 application 3 (three) times daily. If needed [provider] Taking Active   ramipril (ALTACE) 10 MG capsule [id number] Yes TAKE 1 CAPSULE(10 MG) BY MOUTH DAILY Mosie Lukes, MD Taking Active   simvastatin (ZOCOR) 20 MG tablet [id number] Yes TAKE 1 TABLET(20 MG) BY MOUTH DAILY AT 6 PM Mosie Lukes, MD Taking Active   Skin Protectants, Misc. ([place]) [id number] Yes Apply topically at bedtime. [provider] Taking Active             Patient Active Problem List   Diagnosis Date Noted   Chronic left sacroiliac pain 11/02/2020   Abnormal TSH 11/02/2020   Diarrhea 11/02/2019   Lip lesion 05/04/2019   Allergy 11/15/2018   Fall 12/04/2017   Hyperglycemia 12/04/2017   Abnormal results of thyroid function studies 05/26/2017   Vitamin D deficiency 11/07/2015   Preventative health care 11/07/2015   Verruca 11/07/2015   BPV (benign positional vertigo) 05/08/2015   Skin cancer 10/31/2014   Atrophic vaginitis 11/08/2013   Cerumen impaction 06/11/2013    Medicare annual wellness visit, subsequent 05/16/2013   Osteoporosis 03/09/2013   GERD (gastroesophageal reflux disease)    Hypertension    Anxiety    Hyperlipidemia    Arthritis    Chicken pox    Diverticulosis    Colon polyps    Urine incontinence     Immunization History  Administered Date(s) Administered   Fluad Quad(high Dose 65+) 05/04/2019, 04/19/2021   Influenza, High Dose Seasonal PF 05/09/2016, 05/22/2017, 05/07/2018   Influenza,inj,Quad PF,6+ Mos 04/13/2013, 04/20/2014, 05/08/2015   Influenza-Unspecified 05/04/2020   PFIZER Comirnaty(Gray Top)Covid-19 Tri-Sucrose Vaccine 01/23/2021   PFIZER(Purple Top)SARS-COV-2 Vaccination 08/03/2019, 08/24/2019, 05/29/2020   Pfizer Covid-19 Vaccine Bivalent Booster 44yr & up 05/30/2021   Pneumococcal Conjugate-13 11/12/2013   Pneumococcal Polysaccharide-23 04/25/2011   Tdap 01/12/2010, 09/21/2020   Zoster Recombinat (Shingrix) 06/10/2017, 08/08/2017, 09/08/2017   Zoster, Live 07/16/2011    Conditions to be addressed/monitored: HTN, HLD, Anxiety, Depression, and osteoporosis; vit D deficiency; pre DM; GERD; OA  Care Plan : General Pharmacy (Adult)  Updates made by ECherre Robins RPH-CPP since 06/25/2021 12:00 AM     Problem: medication and chronic care management   Priority: High  Onset Date: 12/25/2020     Long-Range Goal: Medication and Chronic care Management   Start Date: 12/25/2020  Recent Progress: On track  Priority: High  Note:   Current Barriers:  Suboptimal therapeutic regimen for osteoporosis Chronic Disease Management support, education, and care coordination needs related to Hypertension, Hyperlipidemia, Pre-Diabetes, Depression, Osteoporosis, Allergic Rhinitis  Pharmacist Clinical Goal(s):  Over the next 180 days, patient will maintain control of HTN, pre DM and hyperlipidemia as evidenced by maintaining goals below  adhere to plan to optimize therapeutic regimen for osteoporosis as evidenced by report of  adherence to recommended medication management changes through collaboration with PharmD and provider.   Interventions: 1:1 collaboration with BMosie Lukes MD regarding development and update of comprehensive plan of care as evidenced by provider attestation and co-signature Inter-disciplinary care team collaboration (see longitudinal plan of care) Comprehensive medication review performed; medication list updated in electronic medical record   Hypertension Controlled; BP goal <140/90 Blood pressure today was 114/70  Denies dizziness Current regimen:  Ramipiril 119mdaily Interventions: Discussed blood pressure  goal Reviewed refill records / adherence Maintain current hypertension medication regimen.   Hyperlipidemia Controlled; LDL goal < 100 Current regimen:  Simvastatin 58m daily Fish Oil 12010mdaily Interventions: Discussed LDL goal Reviewed refill records / adherence Maintain current cholesterol medication regimen.   Pre-Diabetes Controlled;  A1c goal <6.5% Current regimen:  Diet and exercise management   Interventions: Discussed A1c goal Discussed diet and exercie Continue to limit intake of sugar containing foods and beverages Continue to use [employer] exerciser - goal is to get at least 150 minutes of exercise per week.   Osteoporosis / vitamin D deficeincy Goals: prevent falls and reduce risk of fracture due to osteoporosis; normalize serum vitamin D through supplementation Last DEXA 10/02/2020:  Left neck of femur = -2.8 Left forearm = -1.6 Previous DEXA  05/22/2015: Left femoral neck: -2.6  Lumbar spine: -1.2 Previous medications: took Fosamax for 12 years; stopped in August 2011.  History of pelvic fracture from fall when getting out of car History of rib fracture from fall in bath tub (added grab bar after this fall) Current regimen:  Vitamin D 2000 units daily Multi-vitamin  with calcium daily Tums if needed for heartburn Interventions: (addressed  at previous visit) Reviewed last bone density results Reviewed calcium intake form diet and supplements Continue to practice home safety to prevent falls.  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 He son is now in group home which has helped with anxiety / depression but she still worries about him.  Current regimen:  Fluoxetine 41m23mily Intervention:  Continue current medication regimen  Bladder Control:  Patient states she gets up about 3 times per night.  She would like to try taking AZO Bladder Control Interventions:  Reviewed ingredients in AZO Bladder Control - contains pumpkin seed extract and soy germ extract - No interactions with her current medications Contact office if continues to have frequent urination at night.   Health Maintenance  Interventions: Reviewed immunization records. Patient is up to day on vaccinations  Medication management Current pharmacy: [facility] Interventions Comprehensive medication review performed. Continue current medication management strategy Reviewed refill history to assess adherence.   Patient Goals/Self-Care Activities Over the next 180 days, patient will:  take medications as prescribed, target a minimum of 150 minutes of moderate intensity exercise weekly, and consider pharmacotherapy for bone health; get 2nd COVID 19 booster  Follow Up Plan: Clinical pharmacist will f/u by phone in 6 months unless needed sooner.        Medication Assistance: None required.  Patient affirms current coverage meets needs.  [id number] [place] [id number]: [phone]: [phone] Up:  Patient agrees to Care Plan and Follow-up.  Plan: Telephone follow up appointment with care management team member scheduled for:  6 months  TammCherre RobinsarmD Clinical Pharmacist [facility]-[phone]

## 2021-08-21 ENCOUNTER — Other Ambulatory Visit (HOSPITAL_BASED_OUTPATIENT_CLINIC_OR_DEPARTMENT_OTHER): Payer: Self-pay | Admitting: Family Medicine

## 2021-08-21 DIAGNOSIS — Z1231 Encounter for screening mammogram for malignant neoplasm of breast: Secondary | ICD-10-CM

## 2021-08-29 DIAGNOSIS — H40003 Preglaucoma, unspecified, bilateral: Secondary | ICD-10-CM | POA: Diagnosis not present

## 2021-10-08 ENCOUNTER — Ambulatory Visit (HOSPITAL_BASED_OUTPATIENT_CLINIC_OR_DEPARTMENT_OTHER)
Admission: RE | Admit: 2021-10-08 | Discharge: 2021-10-08 | Disposition: A | Discharge status: Home / Self Care | Payer: Medicare Other | Source: Ambulatory Visit | Attending: Family Medicine | Admitting: Family Medicine

## 2021-10-08 ENCOUNTER — Encounter (HOSPITAL_BASED_OUTPATIENT_CLINIC_OR_DEPARTMENT_OTHER): Payer: Self-pay

## 2021-10-08 ENCOUNTER — Other Ambulatory Visit: Payer: Self-pay

## 2021-10-08 DIAGNOSIS — Z1231 Encounter for screening mammogram for malignant neoplasm of breast: Secondary | ICD-10-CM | POA: Insufficient documentation

## 2021-10-10 DIAGNOSIS — M6740 Ganglion, unspecified site: Secondary | ICD-10-CM | POA: Diagnosis not present

## 2021-10-10 DIAGNOSIS — K130 Diseases of lips: Secondary | ICD-10-CM | POA: Diagnosis not present

## 2021-10-10 DIAGNOSIS — L821 Other seborrheic keratosis: Secondary | ICD-10-CM | POA: Diagnosis not present

## 2021-10-10 DIAGNOSIS — L820 Inflamed seborrheic keratosis: Secondary | ICD-10-CM | POA: Diagnosis not present

## 2021-10-10 DIAGNOSIS — D485 Neoplasm of uncertain behavior of skin: Secondary | ICD-10-CM | POA: Diagnosis not present

## 2021-10-10 DIAGNOSIS — B078 Other viral warts: Secondary | ICD-10-CM | POA: Diagnosis not present

## 2021-10-22 NOTE — Progress Notes (Signed)
? ?Subjective:  ? ? Patient ID: Terri Sandoval, female    DOB: [birth year], 81 y.o.   MRN: [id number] ? ?Chief Complaint  ?Patient presents with  ? Follow-up  ? ? ?HPI ?Patient is in today for a follow up.  ? ?Past Medical History:  ?Diagnosis Date  ? Adrenal insufficiency ([place])   ? Anxiety   ? Arthritis   ? Atrophic vaginitis 11/08/2013  ? BPV (benign positional vertigo) 05/08/2015  ? Cataract   ? Chicken pox as a child  ? Colon polyps   ? removed during colonscopy  ? Diverticulitis   ? Diverticulosis   ? GERD (gastroesophageal reflux disease)   ? Hyperlipidemia   ? Hypertension   ? Medicare annual wellness visit, subsequent 05/16/2013  ? Osteoporosis, unspecified 03/09/2013  ? Preventative health care 11/07/2015  ? Skin cancer   ? behind ear  ? Urine incontinence   ? sometimes  ? Verruca 11/07/2015  ? Vitamin D deficiency 11/07/2015  ? ? ?Past Surgical History:  ?Procedure Laterality Date  ? ABDOMINAL HYSTERECTOMY  97  ? has ovaries  ? BREAST BIOPSY Left   ?  FNA  ? CATARACT EXTRACTION  L 09/11/10, R 10/10/10  ? [place]  ? EYE SURGERY Bilateral 10/10/2010, 07/28/17  ? cataracts, posterior capsulotomy b/l  ? TONSILLECTOMY AND ADENOIDECTOMY  1990  ? ? ?Family History  ?Problem Relation Age of Onset  ? Arthritis Mother   ? Hyperlipidemia Mother   ? Heart disease Mother   ? Hypertension Mother   ? Colon cancer Father   ? Diabetes Sister   ?     type 2  ? Heart disease Sister   ? Hypertension Sister   ? Hyperlipidemia Sister   ? Mental illness Maternal Uncle   ? Stroke Maternal Grandmother   ? Pneumonia Maternal Grandfather   ? Cerebral palsy Son   ?     MR  ? Colon cancer Cousin   ? ? ?Social History  ? ?Socioeconomic History  ? Marital status: Married  ?  Spouse name: Not on file  ? Number of children: 2  ? Years of education: Not on file  ? Highest education level: Not on file  ?Occupational History  ? Occupation: retired  ?Tobacco Use  ? Smoking status: Never  ? Smokeless tobacco:  Never  ?Vaping Use  ? Vaping Use: Never used  ?Substance and Sexual Activity  ? Alcohol use: No  ? Drug use: No  ? Sexual activity: Never  ?  Comment: lives with husband and disabled son, no dietary restrictions, not exercising regularly  ?Other Topics Concern  ? Not on file  ?Social History Narrative  ? Not on file  ? ?Social Determinants of Health  ? ?Financial Resource Strain: Low Risk   ? Difficulty of Paying Living Expenses: Not hard at all  ?Food Insecurity: No Food Insecurity  ? Worried About Charity fundraiser in the Last Year: Never true  ? Ran Out of Food in the Last Year: Never true  ?Transportation Needs: No Transportation Needs  ? Lack of Transportation (Medical): No  ? Lack of Transportation (Non-Medical): No  ?Physical Activity: Insufficiently Active  ? Days of Exercise per Week: 4 days  ? Minutes of Exercise per Session: 30 min  ?Stress: No Stress Concern Present  ? Feeling of Stress : Not at all  ?Social Connections: Moderately Integrated  ? Frequency of Communication with Friends and Family:  Once a week  ? Frequency of Social Gatherings with Friends and Family: More than three times a week  ? Attends Religious Services: 1 to 4 times per year  ? Active Member of Clubs or Organizations: No  ? Attends Archivist Meetings: Never  ? Marital Status: Married  ?Intimate Partner Violence: Not At Risk  ? Fear of Current or Ex-Partner: No  ? Emotionally Abused: No  ? Physically Abused: No  ? Sexually Abused: No  ? ? ?Outpatient Medications Prior to Visit  ?Medication Sig Dispense Refill  ? alclomethasone (ACLOVATE) 0.05 % ointment Apply topically 2 (two) times daily.    ? Calcium Carbonate Antacid (TUMS PO) Take 1 tablet by mouth as needed.    ? cetirizine (ZYRTEC) 10 MG tablet Take 10 mg by mouth daily as needed for allergies.    ? Cholecalciferol (VITAMIN D3) 50 MCG (2000 UT) capsule Take 1 capsule by mouth daily.    ? fish oil-omega-3 fatty acids 1000 MG capsule Take 1,200 mg by mouth daily.     ? FLUoxetine (PROZAC) 20 MG capsule TAKE 1 CAPSULE(20 MG) BY MOUTH DAILY 90 capsule 1  ? glucosamine-chondroitin 500-400 MG tablet Take 1 tablet by mouth 2 (two) times daily.     ? methylcellulose (CITRUCEL) oral powder Use as directed Daily (Patient taking differently: Use as directed Daily if needed)    ? Multiple Vitamin (MULTIVITAMIN) tablet Take 1 tablet by mouth daily. Vitamin pack from [facility]    ? mupirocin ointment (BACTROBAN) 2 % 1 application 3 (three) times daily. If needed    ? Pumpkin Seed-Soy Germ (AZO BLADDER CONTROL/GO-LESS PO) Take by mouth daily.    ? ramipril (ALTACE) 10 MG capsule TAKE 1 CAPSULE(10 MG) BY MOUTH DAILY 90 capsule 1  ? simvastatin (ZOCOR) 20 MG tablet TAKE 1 TABLET(20 MG) BY MOUTH DAILY AT 6 PM 90 tablet 1  ? Skin Protectants, Misc. (AQUAPHOR LIP REPAIR EX) Apply topically at bedtime.    ? ?No facility-administered medications prior to visit.  ? ? ?Allergies  ?Allergen Reactions  ? Sulfamethoxazole-Trimethoprim Other (See Comments)  ?  Very sore, sore in corner of lip  ? ? ?Review of Systems  ?Constitutional:  Negative for fever and malaise/fatigue.  ?HENT:  Positive for hearing loss. Negative for congestion.   ?Eyes:  Negative for blurred vision.  ?Respiratory:  Negative for shortness of breath.   ?Cardiovascular:  Negative for chest pain, palpitations and leg swelling.  ?Gastrointestinal:  Negative for abdominal pain, blood in stool and nausea.  ?Genitourinary:  Negative for dysuria and frequency.  ?Musculoskeletal:  Negative for falls.  ?Skin:  Negative for rash.  ?Neurological:  Negative for dizziness, loss of consciousness and headaches.  ?Endo/Heme/Allergies:  Negative for environmental allergies.  ?Psychiatric/Behavioral:  Negative for depression. The patient is not nervous/anxious.   ? ?   ?Objective:  ?  ?Physical Exam ?Constitutional:   ?   General: She is not in acute distress. ?   Appearance: She is well-developed.  ?HENT:  ?   Head: Normocephalic and atraumatic.  ?Eyes:   ?   Conjunctiva/sclera: Conjunctivae normal.  ?Neck:  ?   Thyroid: No thyromegaly.  ?Cardiovascular:  ?   Rate and Rhythm: Normal rate and regular rhythm.  ?   Heart sounds: Normal heart sounds. No murmur heard. ?Pulmonary:  ?   Effort: Pulmonary effort is normal. No respiratory distress.  ?   Breath sounds: Normal breath sounds.  ?Abdominal:  ?   General: Bowel  sounds are normal. There is no distension.  ?   Palpations: Abdomen is soft. There is no mass.  ?   Tenderness: There is no abdominal tenderness.  ?Musculoskeletal:  ?   Cervical back: Neck supple.  ?Lymphadenopathy:  ?   Cervical: No cervical adenopathy.  ?Skin: ?   General: Skin is warm and dry.  ?   Findings: Lesion present.  ?   Comments: Healing scab left anterior calf and right hand  ?Neurological:  ?   Mental Status: She is alert and oriented to person, place, and time.  ?Psychiatric:     ?   Behavior: Behavior normal.  ? ? ?BP 116/70 (BP Location: Right Arm, Patient Position: Sitting, Cuff Size: Normal)   Pulse 73   Resp 20   Ht '4\' 11"'$  (1.499 m)   Wt 144 lb (65.3 kg)   SpO2 97%   BMI 29.08 kg/m?  ?Wt Readings from Last 3 Encounters:  ?10/23/21 144 lb (65.3 kg)  ?04/19/21 142 lb (64.4 kg)  ?04/19/21 142 lb (64.4 kg)  ? ? ?Diabetic Foot Exam - Simple   ?No data filed ?  ? ?Lab Results  ?Component Value Date  ? WBC 5.1 10/23/2021  ? HGB 13.5 10/23/2021  ? HCT 40.8 10/23/2021  ? PLT 237.0 10/23/2021  ? GLUCOSE 80 10/23/2021  ? CHOL 176 10/23/2021  ? TRIG 184.0 (H) 10/23/2021  ? HDL 71.10 10/23/2021  ? [place] 68 10/23/2021  ? ALT 17 10/23/2021  ? AST 22 10/23/2021  ? NA 138 10/23/2021  ? K 4.3 10/23/2021  ? CL 100 10/23/2021  ? CREATININE 0.77 10/23/2021  ? BUN 15 10/23/2021  ? CO2 32 10/23/2021  ? TSH 4.43 10/23/2021  ? HGBA1C 6.2 10/23/2021  ? MICROALBUR <0.7 05/04/2018  ? ? ?Lab Results  ?Component Value Date  ? TSH 4.43 10/23/2021  ? ?Lab Results  ?Component Value Date  ? WBC 5.1 10/23/2021  ? HGB 13.5 10/23/2021  ? HCT 40.8 10/23/2021  ? MCV  89.3 10/23/2021  ? PLT 237.0 10/23/2021  ? ?Lab Results  ?Component Value Date  ? NA 138 10/23/2021  ? K 4.3 10/23/2021  ? CO2 32 10/23/2021  ? GLUCOSE 80 10/23/2021  ? BUN 15 10/23/2021  ? CREATININE 0.77 04/1

## 2021-10-23 ENCOUNTER — Encounter: Payer: Self-pay | Admitting: Family Medicine

## 2021-10-23 ENCOUNTER — Ambulatory Visit (INDEPENDENT_AMBULATORY_CARE_PROVIDER_SITE_OTHER): Payer: Medicare Other | Admitting: Family Medicine

## 2021-10-23 VITALS — BP 116/70 | HR 73 | Resp 20 | Ht 59.0 in | Wt 144.0 lb

## 2021-10-23 DIAGNOSIS — R739 Hyperglycemia, unspecified: Secondary | ICD-10-CM

## 2021-10-23 DIAGNOSIS — E782 Mixed hyperlipidemia: Secondary | ICD-10-CM

## 2021-10-23 DIAGNOSIS — R32 Unspecified urinary incontinence: Secondary | ICD-10-CM | POA: Diagnosis not present

## 2021-10-23 DIAGNOSIS — R7989 Other specified abnormal findings of blood chemistry: Secondary | ICD-10-CM

## 2021-10-23 DIAGNOSIS — M818 Other osteoporosis without current pathological fracture: Secondary | ICD-10-CM

## 2021-10-23 DIAGNOSIS — I10 Essential (primary) hypertension: Secondary | ICD-10-CM | POA: Diagnosis not present

## 2021-10-23 DIAGNOSIS — E559 Vitamin D deficiency, unspecified: Secondary | ICD-10-CM

## 2021-10-23 LAB — CBC
HCT: 40.8 % (ref 36.0–46.0)
Hemoglobin: 13.5 g/dL (ref 12.0–15.0)
MCHC: 33.1 g/dL (ref 30.0–36.0)
MCV: 89.3 fl (ref 78.0–100.0)
Platelets: 237 10*3/uL (ref 150.0–400.0)
RBC: 4.57 Mil/uL (ref 3.87–5.11)
RDW: 14.1 % (ref 11.5–15.5)
WBC: 5.1 10*3/uL (ref 4.0–10.5)

## 2021-10-23 LAB — COMPREHENSIVE METABOLIC PANEL
ALT: 17 U/L (ref 0–35)
AST: 22 U/L (ref 0–37)
Albumin: 4.5 g/dL (ref 3.5–5.2)
Alkaline Phosphatase: 62 U/L (ref 39–117)
BUN: 15 mg/dL (ref 6–23)
CO2: 32 mEq/L (ref 19–32)
Calcium: 9.8 mg/dL (ref 8.4–10.5)
Chloride: 100 mEq/L (ref 96–112)
Creatinine, Ser: 0.77 mg/dL (ref 0.40–1.20)
GFR: 72.92 mL/min (ref >60.00)
Glucose, Bld: 80 mg/dL (ref 70–99)
Potassium: 4.3 mEq/L (ref 3.5–5.1)
Sodium: 138 mEq/L (ref 135–145)
Total Bilirubin: 0.4 mg/dL (ref 0.2–1.2)
Total Protein: 7.1 g/dL (ref 6.0–8.3)

## 2021-10-23 LAB — LIPID PANEL
Cholesterol: 176 mg/dL (ref 0–200)
HDL: 71.1 mg/dL (ref >39.00)
LDL Cholesterol: 68 mg/dL (ref 0–99)
NonHDL: 104.92
Total CHOL/HDL Ratio: 2
Triglycerides: 184 mg/dL — ABNORMAL HIGH (ref 0.0–149.0)
VLDL: 36.8 mg/dL (ref 0.0–40.0)

## 2021-10-23 LAB — VITAMIN D 25 HYDROXY (VIT D DEFICIENCY, FRACTURES): VITD: 55.48 ng/mL (ref 30.00–100.00)

## 2021-10-23 LAB — TSH: TSH: 4.43 u[IU]/mL (ref 0.35–5.50)

## 2021-10-23 LAB — HEMOGLOBIN A1C: Hgb A1c MFr Bld: 6.2 % (ref 4.6–6.5)

## 2021-10-23 NOTE — Assessment & Plan Note (Signed)
Encourage heart healthy diet such as MIND or DASH diet, increase exercise, avoid trans fats, simple carbohydrates and processed foods, consider a krill or fish or flaxseed oil cap daily.  °

## 2021-10-23 NOTE — Assessment & Plan Note (Signed)
Encouraged to get adequate exercise, calcium and vitamin d intake 

## 2021-10-23 NOTE — Patient Instructions (Signed)

## 2021-10-23 NOTE — Assessment & Plan Note (Signed)
Supplement and monitor 

## 2021-10-23 NOTE — Assessment & Plan Note (Signed)
Since adding AZO cranberry tabs she does note an improvement.  ?

## 2021-10-23 NOTE — Assessment & Plan Note (Signed)
Well controlled, no changes to meds. Encouraged heart healthy diet such as the DASH diet and exercise as tolerated.  °

## 2021-10-23 NOTE — Assessment & Plan Note (Signed)
hgba1c acceptable, minimize simple carbs. Increase exercise as tolerated.  

## 2021-10-26 ENCOUNTER — Encounter: Payer: Self-pay | Admitting: Podiatry

## 2021-10-26 ENCOUNTER — Ambulatory Visit (INDEPENDENT_AMBULATORY_CARE_PROVIDER_SITE_OTHER): Payer: Medicare Other | Admitting: Podiatry

## 2021-10-26 VITALS — BP 114/58 | HR 62 | Temp 97.7°F

## 2021-10-26 DIAGNOSIS — B351 Tinea unguium: Secondary | ICD-10-CM

## 2021-10-26 DIAGNOSIS — M79675 Pain in left toe(s): Secondary | ICD-10-CM

## 2021-10-26 DIAGNOSIS — M79674 Pain in right toe(s): Secondary | ICD-10-CM

## 2021-11-04 NOTE — Progress Notes (Signed)
Subjective: ?Terri Sandoval presents today referred by Mosie Lukes, MD for complaint of with chief concern of diabetes with elongated, thickened, painful, discolored toenails for month. Aggravating factor(s) include wearing enclosed shoegear. Patient has tried self attempt at trimming toenail..  ? ?Past Medical History:  ?Diagnosis Date  ? Adrenal insufficiency ([place])   ? Anxiety   ? Arthritis   ? Atrophic vaginitis 11/08/2013  ? BPV (benign positional vertigo) 05/08/2015  ? Cataract   ? Chicken pox as a child  ? Colon polyps   ? removed during colonscopy  ? Diverticulitis   ? Diverticulosis   ? GERD (gastroesophageal reflux disease)   ? Hyperlipidemia   ? Hypertension   ? Medicare annual wellness visit, subsequent 05/16/2013  ? Osteoporosis, unspecified 03/09/2013  ? Preventative health care 11/07/2015  ? Skin cancer   ? behind ear  ? Urine incontinence   ? sometimes  ? Verruca 11/07/2015  ? Vitamin D deficiency 11/07/2015  ?  ? ?Patient Active Problem List  ? Diagnosis Date Noted  ? Chronic left sacroiliac pain 11/02/2020  ? Abnormal TSH 11/02/2020  ? Lip lesion 05/04/2019  ? Allergy 11/15/2018  ? Fall 12/04/2017  ? Hyperglycemia 12/04/2017  ? Abnormal results of thyroid function studies 05/26/2017  ? Vitamin D deficiency 11/07/2015  ? Preventative health care 11/07/2015  ? Verruca 11/07/2015  ? BPV (benign positional vertigo) 05/08/2015  ? Skin cancer 10/31/2014  ? Atrophic vaginitis 11/08/2013  ? Cerumen impaction 06/11/2013  ? Medicare annual wellness visit, subsequent 05/16/2013  ? Osteoporosis 03/09/2013  ? GERD (gastroesophageal reflux disease)   ? Hypertension   ? Anxiety   ? Hyperlipidemia   ? Arthritis   ? Chicken pox   ? Diverticulosis   ? Colon polyps   ? Urine incontinence   ?  ? ?Past Surgical History:  ?Procedure Laterality Date  ? ABDOMINAL HYSTERECTOMY  97  ? has ovaries  ? BREAST BIOPSY Left   ?  FNA  ? CATARACT EXTRACTION  L 09/11/10, R 10/10/10  ? [place]  ?  EYE SURGERY Bilateral 10/10/2010, 07/28/17  ? cataracts, posterior capsulotomy b/l  ? TONSILLECTOMY AND ADENOIDECTOMY  1990  ?  ? ?Current Outpatient Medications on File Prior to Visit  ?Medication Sig Dispense Refill  ? alclomethasone (ACLOVATE) 0.05 % ointment Apply topically 2 (two) times daily.    ? Calcium Carbonate Antacid (TUMS PO) Take 1 tablet by mouth as needed.    ? cetirizine (ZYRTEC) 10 MG tablet Take 10 mg by mouth daily as needed for allergies.    ? Cholecalciferol (VITAMIN D3) 50 MCG (2000 UT) capsule Take 1 capsule by mouth daily.    ? fish oil-omega-3 fatty acids 1000 MG capsule Take 1,200 mg by mouth daily.    ? FLUoxetine (PROZAC) 20 MG capsule TAKE 1 CAPSULE(20 MG) BY MOUTH DAILY 90 capsule 1  ? glucosamine-chondroitin 500-400 MG tablet Take 1 tablet by mouth 2 (two) times daily.     ? methylcellulose (CITRUCEL) oral powder Use as directed Daily (Patient taking differently: Use as directed Daily if needed)    ? Multiple Vitamin (MULTIVITAMIN) tablet Take 1 tablet by mouth daily. Vitamin pack from [facility]    ? mupirocin ointment (BACTROBAN) 2 % 1 application 3 (three) times daily. If needed    ? Pumpkin Seed-Soy Germ (AZO BLADDER CONTROL/GO-LESS PO) Take by mouth daily.    ? ramipril (ALTACE) 10 MG capsule TAKE 1 CAPSULE(10 MG)  BY MOUTH DAILY 90 capsule 1  ? simvastatin (ZOCOR) 20 MG tablet TAKE 1 TABLET(20 MG) BY MOUTH DAILY AT 6 PM 90 tablet 1  ? Skin Protectants, Misc. (AQUAPHOR LIP REPAIR EX) Apply topically at bedtime.    ? ?No current facility-administered medications on file prior to visit.  ?  ? ?Allergies  ?Allergen Reactions  ? Sulfamethoxazole-Trimethoprim Other (See Comments)  ?  Very sore, sore in corner of lip  ?  ? ?Social History  ? ?Occupational History  ? Occupation: retired  ?Tobacco Use  ? Smoking status: Never  ? Smokeless tobacco: Never  ?Vaping Use  ? Vaping Use: Never used  ?Substance and Sexual Activity  ? Alcohol use: No  ? Drug use: No  ? Sexual activity: Never  ?  Comment:  lives with husband and disabled son, no dietary restrictions, not exercising regularly  ?  ? ?Family History  ?Problem Relation Age of Onset  ? Arthritis Mother   ? Hyperlipidemia Mother   ? Heart disease Mother   ? Hypertension Mother   ? Colon cancer Father   ? Diabetes Sister   ?     type 2  ? Heart disease Sister   ? Hypertension Sister   ? Hyperlipidemia Sister   ? Mental illness Maternal Uncle   ? Stroke Maternal Grandmother   ? Pneumonia Maternal Grandfather   ? Cerebral palsy Son   ?     MR  ? Colon cancer Cousin   ?  ? ?Immunization History  ?Administered Date(s) Administered  ? Fluad Quad(high Dose 65+) 05/04/2019, 04/19/2021  ? Influenza, High Dose Seasonal PF 05/09/2016, 05/22/2017, 05/07/2018  ? Influenza,inj,Quad PF,6+ Mos 04/13/2013, 04/20/2014, 05/08/2015  ? Influenza-Unspecified 05/04/2020  ? PFIZER Comirnaty(Gray Top)Covid-19 Tri-Sucrose Vaccine 01/23/2021  ? PFIZER(Purple Top)SARS-COV-2 Vaccination 08/03/2019, 08/24/2019, 05/29/2020  ? Pension scheme manager 55yr & up 05/30/2021  ? Pneumococcal Conjugate-13 11/12/2013  ? Pneumococcal Polysaccharide-23 04/25/2011  ? Tdap 01/12/2010, 09/21/2020  ? Zoster Recombinat (Shingrix) 06/10/2017, 08/08/2017, 09/08/2017  ? Zoster, Live 07/16/2011  ?  ? ?Objective: ?Terri MACNEILLis a pleasant 81y.o. female WD, WN in NAD. AAO x 3. ? ?Vitals:  ? 10/26/21 1129  ?BP: (!) 114/58  ?Pulse: 62  ?Temp: 97.7 ?F (36.5 ?C)  ? ? ?Vascular Examination:  ?CFT <3 seconds b/l LE. Palpable DP pulse(s) b/l LE. Palpable PT pulse(s) b/l LE. Pedal hair absent. No pain with calf compression b/l. Lower extremity skin temperature gradient within normal limits. No edema noted b/l LE. Varicosities present b/l. No ischemia or gangrene noted b/l LE. No cyanosis or clubbing noted b/l LE. ? ?Dermatological Examination: ?Pedal skin thin, shiny and atrophic b/l LE. No open wounds b/l LE. No interdigital macerations noted b/l LE. Toenails 1-5 b/l elongated, discolored,  dystrophic, thickened, crumbly with subungual debris and tenderness to dorsal palpation. ? ?Musculoskeletal: ?Muscle strength 5/5 to all lower extremity muscle groups bilaterally. No pain, crepitus or joint limitation noted with ROM bilateral LE. No gross bony deformities bilaterally. Patient ambulates independent of any assistive aids. ? ?Neurological: ?Protective sensation intact 5/5 intact bilaterally with 10g monofilament b/l. Vibratory sensation intact b/l. Deep tendon reflexes normal b/l.  ? ? ?  Latest Ref Rng & Units 10/23/2021  ? 12:00 PM 04/19/2021  ?  2:10 PM  ?Hemoglobin A1C  ?Hemoglobin-A1c 4.6 - 6.5 % 6.2   6.1    ? ?Assessment: ?1. Pain due to onychomycosis of toenails of both feet   ?  ?Plan: ?-Patient  was evaluated and treated. All patient's and/or POA's questions/concerns answered on today's visit. ?-Consent given for treatment as described below: ?-Patient to continue soft, supportive shoe gear daily. ?-Discussed topical, laser and oral medication for onychomycosis. Patient opted for toenail debridement only on today.  ?-Patient/POA to call should there be question/concern in the interim. ? ?Return in about 3 months (around 01/25/2022). ? ?Marzetta Board, DPM ?

## 2021-11-18 ENCOUNTER — Other Ambulatory Visit: Payer: Self-pay | Admitting: Family Medicine

## 2021-12-24 ENCOUNTER — Ambulatory Visit (INDEPENDENT_AMBULATORY_CARE_PROVIDER_SITE_OTHER): Payer: Medicare Other | Admitting: Pharmacist

## 2021-12-24 DIAGNOSIS — M818 Other osteoporosis without current pathological fracture: Secondary | ICD-10-CM

## 2021-12-24 DIAGNOSIS — R739 Hyperglycemia, unspecified: Secondary | ICD-10-CM

## 2021-12-24 DIAGNOSIS — E782 Mixed hyperlipidemia: Secondary | ICD-10-CM

## 2021-12-24 DIAGNOSIS — I10 Essential (primary) hypertension: Secondary | ICD-10-CM

## 2021-12-24 NOTE — Patient Instructions (Signed)
Mrs. Weldin It was a pleasure speaking with you  Below is a summary of your health goals and care plan   Patient Goals/Self-Care Activities take medications as prescribed,  Target a minimum of 150 minutes of moderate intensity exercise weekly Monitor blood glucose at home - if starts to increase, then change Citrucel regular back to sugar free version if you can find or try Benefiber    If you have any questions or concerns, please feel free to contact me either at the phone number below or with a MyChart message.   Keep up the good work!  [name], PharmD Clinical Pharmacist [facility] [place] [phone] (direct line)  [phone] (main office number)   Patient verbalizes understanding of instructions and care plan provided today and agrees to view in [place]. Active MyChart status and patient understanding of how to access instructions and care plan via MyChart confirmed with patient.

## 2021-12-24 NOTE — Chronic Care Management (AMB) (Signed)
Chronic Care Management Pharmacy Note  12/24/2021 Name:  Terri Sandoval MRN:  [id number] DOB:  07/16/1940  Summary:  Patient is doing well. Reviewed adherence. Patient has had difficulty finding Citrucel sugar free. She has been using regular Citrucel which is high in sugar - about 27 grams per serving. Advised patient to check blood glucose at home. If increases, then could try Benefiber daily.  Target a minimum of 150 minutes of moderate intensity exercise weekly  Subjective: Terri Sandoval is an 81 y.o. year old female who is a primary patient of Mosie Lukes, MD.  The CCM team was consulted for assistance with disease management and care coordination needs.    Engaged with patient by telephone for follow up visit in response to provider referral for pharmacy case management and/or care coordination services.   Consent to Services:  The patient was given information about Chronic Care Management services, agreed to services, and gave verbal consent prior to initiation of services.  Please see initial visit note for detailed documentation.   Patient Care Team: Mosie Lukes, MD as PCP - General (Family Medicine) Armbruster, Carlota Raspberry, MD as Consulting Physician (Gastroenterology) [name], RPH-CPP (Pharmacist) Tsamis, Constance Haw, MD as Referring Physician (Ophthalmology) Susie Cassette, Katharine Look, MD as Referring Physician (Dermatology)  Recent office visits: 10/23/2021 - Fam Med (Dr Charlett Blake) F/U chronic conditions. Labs ordered. No med changes noted.  04/19/2021 - PCP (Dr Charlett Blake) F/U chronic medical conditions; no medication changes noted.    Recent consult visits: 10/26/2021 - Podiatry (Dr Elisha Ponder) [facility]. Discussed topical, laser and oral medication for onychomycosis. Patient opted for toenail debridement only on today. 08/29/2021 - Ophthalmology (Dr Deatra Ina) Glaucoma evaluation. No med changes. F/U 6 months.  04/24/2021 - ophthalmology (Dr Deatra Ina - [facility]) F/U  epiretinal membrane / Blepharitis of both upper and lower eyelid  / Dry eye syndrome of both lacrimal glands / Dermatochalasis of both upper eyelids / PVD (posterior vitreous detachment), right eye /  Vitreous floater, bilateral / Glaucoma suspect of both eyes / Astigmatism with presbyopia, bilateral; Recommended eye lid hygiene, continue to use dry eye drops.  Return in about 4 months (around 08/25/2021) for Glaucoma eval, IOP check.   Hospital visits: None in previous 6 months  Objective:  Lab Results  Component Value Date   CREATININE 0.77 10/23/2021   CREATININE 0.84 04/19/2021   CREATININE 0.86 11/02/2020    Lab Results  Component Value Date   HGBA1C 6.2 10/23/2021   Last diabetic Eye exam: No results found for: "HMDIABEYEEXA"  Last diabetic Foot exam: No results found for: "HMDIABFOOTEX"      Component Value Date/Time   CHOL 176 10/23/2021 1200   TRIG 184.0 (H) 10/23/2021 1200   HDL 71.10 10/23/2021 1200   CHOLHDL 2 10/23/2021 1200   VLDL 36.8 10/23/2021 1200   LDLCALC 68 10/23/2021 1200   LDLCALC 76 05/16/2020 0812       Latest Ref Rng & Units 10/23/2021   12:00 PM 04/19/2021    2:10 PM 11/02/2020   11:26 AM  Hepatic Function  Total Protein 6.0 - 8.3 g/dL 7.1  6.9  7.0   Albumin 3.5 - 5.2 g/dL 4.5  4.2  4.2   AST 0 - 37 U/L 22  21  22    ALT 0 - 35 U/L 17  15  19    Alk Phosphatase 39 - 117 U/L 62  63  63   Total Bilirubin 0.2 - 1.2 mg/dL 0.4  0.3  0.4     Lab Results  Component Value Date/Time   TSH 4.43 10/23/2021 12:00 PM   TSH 3.82 04/19/2021 02:10 PM   FREET4 0.72 04/19/2021 02:10 PM   FREET4 0.69 11/02/2020 11:26 AM       Latest Ref Rng & Units 10/23/2021   12:00 PM 04/19/2021    2:10 PM 11/02/2020   11:26 AM  CBC  WBC 4.0 - 10.5 K/uL 5.1  5.0  5.5   Hemoglobin 12.0 - 15.0 g/dL 13.5  13.4  13.7   Hematocrit 36.0 - 46.0 % 40.8  39.8  41.6   Platelets 150.0 - 400.0 K/uL 237.0  222.0  231.0     Lab Results  Component Value Date/Time   VD25OH 55.48  10/23/2021 12:00 PM   VD25OH 48.45 04/19/2021 02:10 PM    Clinical ASCVD: No  The ASCVD Risk score (Arnett DK, et al., 2019) failed to calculate for the following reasons:   The 2019 ASCVD risk score is only valid for ages 90 to 97    Other: See care plan for DEXA     04/19/2021    2:31 PM 12/25/2020   11:24 AM 06/26/2020   11:59 AM  Depression screen PHQ 2/9  Decreased Interest 0 0 0  Down, Depressed, Hopeless 0 0   PHQ - 2 Score 0 0 0  Altered sleeping   0  Tired, decreased energy   1  Change in appetite   0  Feeling bad or failure about yourself    0  Trouble concentrating   1  Moving slowly or fidgety/restless   1  Suicidal thoughts   0  PHQ-9 Score   3     Social History   Tobacco Use  Smoking Status Never  Smokeless Tobacco Never   BP Readings from Last 3 Encounters:  10/26/21 (!) 114/58  10/23/21 116/70  06/25/21 114/70   Pulse Readings from Last 3 Encounters:  10/26/21 62  10/23/21 73  04/19/21 68   Wt Readings from Last 3 Encounters:  10/23/21 144 lb (65.3 kg)  04/19/21 142 lb (64.4 kg)  04/19/21 142 lb (64.4 kg)    Assessment: Review of patient past medical history, allergies, medications, health status, including review of consultants reports, laboratory and other test data, was performed as part of comprehensive evaluation and provision of chronic care management services.   SDOH:  (Social Determinants of Health) assessments and interventions performed:  SDOH Interventions    Flowsheet Row Most Recent Value  SDOH Interventions   Financial Strain Interventions Intervention Not Indicated  Transportation Interventions Intervention Not Indicated         CCM Care Plan  Allergies  Allergen Reactions   Sulfamethoxazole-Trimethoprim Other (See Comments)    Very sore, sore in corner of lip    Medications Reviewed Today     Reviewed by [name], RPH-CPP (Pharmacist) on 12/24/21 at 1021  Med List Status: <None>   Medication Order  Taking? Sig Documenting Provider Last Dose Status Informant  alclomethasone (ACLOVATE) 0.05 % ointment [id number] Yes Apply topically 2 (two) times daily. [provider] Taking Active   Calcium Carbonate Antacid (TUMS PO) [id number] Yes Take 1 tablet by mouth as needed. [provider] Taking Active   cetirizine (ZYRTEC) 10 MG tablet [id number] Yes Take 10 mg by mouth daily as needed for allergies. [provider] Taking Active   Cholecalciferol (VITAMIN D3) 50 MCG (2000 UT) capsule [id number] Yes Take 1 capsule by mouth daily. [provider] Taking Active   fish oil-omega-3 fatty acids 1000 MG capsule [id number] Yes Take 1,200 mg by mouth daily. [provider] Taking Active   FLUoxetine (PROZAC) 20 MG capsule [id number] Yes TAKE 1 CAPSULE(20 MG) BY MOUTH DAILY Mosie Lukes, MD Taking Active   glucosamine-chondroitin 500-400 MG tablet [id number] Yes Take 1 tablet by mouth daily. [provider] Taking Active   methylcellulose (CITRUCEL) oral powder [id number] Yes Use as directed Daily  Patient taking differently: Use as directed Daily if needed   Armbruster, Carlota Raspberry, MD Taking Active   Multiple Vitamin (MULTIVITAMIN) tablet [id number] Yes Take 1 tablet by mouth daily. Vitamin pack from [facility] [provider] Taking Active   mupirocin ointment (BACTROBAN) 2 % [id number] Yes 1 application 3 (three) times daily. If needed [provider] Taking Active   Pumpkin Seed-Soy Germ (AZO BLADDER CONTROL/GO-LESS PO) [id number] Yes Take by mouth daily. [provider] Taking Active   ramipril (ALTACE) 10 MG capsule [id number] Yes TAKE 1 CAPSULE(10 MG) BY MOUTH DAILY Mosie Lukes, MD Taking Active   simvastatin (ZOCOR) 20 MG tablet [id number] Yes TAKE 1 TABLET(20 MG) BY MOUTH DAILY AT 6 PM Mosie Lukes, MD Taking Active   Skin Protectants, Misc. ([place]) [id number] Yes Apply topically at bedtime. [provider]  Taking Active             Patient Active Problem List   Diagnosis Date Noted   Chronic left sacroiliac pain 11/02/2020   Abnormal TSH 11/02/2020   Lip lesion 05/04/2019   Allergy 11/15/2018   Fall 12/04/2017   Hyperglycemia 12/04/2017   Abnormal results of thyroid function studies 05/26/2017   Vitamin D deficiency 11/07/2015   Preventative health care 11/07/2015   Verruca 11/07/2015   BPV (benign positional vertigo) 05/08/2015   Skin cancer 10/31/2014   Atrophic vaginitis 11/08/2013   Cerumen impaction 06/11/2013   Medicare annual wellness visit, subsequent 05/16/2013   Osteoporosis 03/09/2013   GERD (gastroesophageal reflux disease)    Hypertension    Anxiety    Hyperlipidemia    Arthritis    Chicken pox    Diverticulosis    Colon polyps    Urine incontinence     Immunization History  Administered Date(s) Administered   Fluad Quad(high Dose 65+) 05/04/2019, 04/19/2021   Influenza, High Dose Seasonal PF 05/09/2016, 05/22/2017, 05/07/2018   Influenza,inj,Quad PF,6+ Mos 04/13/2013, 04/20/2014, 05/08/2015   Influenza-Unspecified 05/04/2020   PFIZER Comirnaty(Gray Top)Covid-19 Tri-Sucrose Vaccine 01/23/2021   PFIZER(Purple Top)SARS-COV-2 Vaccination 08/03/2019, 08/24/2019, 05/29/2020   Pfizer Covid-19 Vaccine Bivalent Booster 10yr & up 05/30/2021   Pneumococcal Conjugate-13 11/12/2013   Pneumococcal Polysaccharide-23 04/25/2011   Tdap 01/12/2010, 09/21/2020   Zoster Recombinat (Shingrix) 06/10/2017, 08/08/2017, 09/08/2017   Zoster, Live 07/16/2011    Conditions to be addressed/monitored: HTN, HLD, Anxiety, Depression, and osteoporosis; vit D deficiency; pre DM; GERD; OA  Care Plan : General Pharmacy (Adult)  Updates made by ECherre Robins RPH-CPP since 12/24/2021 12:00 AM     Problem: medication and chronic care management   Priority: High  Onset Date: 12/25/2020     Long-Range Goal: Medication and Chronic care Management   Start Date: 12/25/2020  Recent  Progress: On track  Priority: High  Note:   Current Barriers:  Suboptimal therapeutic regimen for osteoporosis Chronic Disease Management support, education, and care coordination needs related to Hypertension, Hyperlipidemia, Pre-Diabetes, Depression, Osteoporosis, Allergic Rhinitis  Pharmacist Clinical Goal(s):  Over the next 180 days, patient will maintain control  of HTN, pre DM and hyperlipidemia as evidenced by maintaining goals below  adhere to plan to optimize therapeutic regimen for osteoporosis as evidenced by report of adherence to recommended medication management changes through collaboration with PharmD and provider.   Interventions: 1:1 collaboration with Mosie Lukes, MD regarding development and update of comprehensive plan of care as evidenced by provider attestation and co-signature Inter-disciplinary care team collaboration (see longitudinal plan of care) Comprehensive medication review performed; medication list updated in electronic medical record   Hypertension Controlled; BP goal <140/90 Blood pressure today was 114/70  Denies dizziness Current regimen:  Ramipiril 79m daily Interventions: Discussed blood pressure goal Reviewed refill records / adherence Maintain current hypertension medication regimen.   Hyperlipidemia Controlled; LDL goal < 100 Current regimen:  Simvastatin 284mdaily Fish Oil 120022maily Interventions: Discussed LDL goal Reviewed refill records / adherence Maintain current cholesterol medication regimen.   Pre-Diabetes Controlled;  A1c goal <6.5% Current regimen:  Diet and exercise management   Patient is having difficulty finding Citrucel sugar free fiber supplement. She has even called company that [employer] they reports that there is a shortage of the sugar free version.  Interventions: Discussed A1c goal Discussed diet and exercie Continue to limit intake of sugar containing foods and beverages Continue to use  Cubbi exerciser - goal is to get at least 150 minutes of exercise per week.  Checked amount of sugar / carbs in regular Citrucel. Appears there is about 27 grams of sugar per serving in regular citrucel which is rather high. Patient will use the regular Citrucel and monitor blood glucose. If notices increase in blood glucose, then could change to Benefiber.  Reviewed home blood glucose goals  Fasting blood glucose goal (before meals) = 80 to 130 Blood glucose goal after a meal = less than 180   Osteoporosis / vitamin D deficeincy Goals: prevent falls and reduce risk of fracture due to osteoporosis; normalize serum vitamin D through supplementation Last DEXA 10/02/2020:  Left neck of femur = -2.8 Left forearm = -1.6 Previous DEXA  05/22/2015: Left femoral neck: -2.6  Lumbar spine: -1.2 Previous medications: took Fosamax for 12 years; stopped in August 2011.  History of pelvic fracture from fall when getting out of car History of rib fracture from fall in bath tub (added grab bar after this fall) Current regimen:  Vitamin D 2000 units daily Multi-vitamin  with calcium daily Tums if needed for heartburn Interventions:  Reviewed last bone density results Reviewed calcium intake form diet and supplements Continue to practice home safety to prevent falls.  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 He son is now in group home which has helped with anxiety / depression but she still worries about him.  Current regimen:  Fluoxetine 20mg63mly Intervention:  Continue current medication regimen  Bladder Control:  Patient states she gets up about 3 times per night.  Current  therapy:   AZO Bladder Control Reports that AZO Bladder Control was more effective when she first started but she does not want to take prescribed medication for bladder control at this time.  Interventions:  Continue AZO Bladder Control Contact office if continues to have frequent urination  at night.   Health Maintenance  Interventions: Reviewed immunization records. Patient is up to date on vaccinations  Medication management Current pharmacy: [facility] Interventions Comprehensive medication review performed. Continue current medication management strategy Reviewed refill history to assess adherence.   Patient Goals/Self-Care Activities Over the next 180 days, patient will:  take medications as prescribed,  Target a minimum of 150 minutes of moderate intensity exercise weekly Monitor blood glucose at home - if starts to increase, then change Citrucel regular back to sugar free version if you can find or try Benefiber   Follow Up Plan: Clinical pharmacist will f/u by phone in 6 months unless needed sooner.         Medication Assistance: None required.  Patient affirms current coverage meets needs.  Patient's preferred pharmacy is:  [id number] - [place], [place] - [address] [place] [address] [place] [address] Phone: [phone] Fax: [phone]   Follow Up:  Patient agrees to Care Plan and Follow-up.  Plan: Telephone follow up appointment with care management team member scheduled for:  6 months  [name], PharmD Clinical Pharmacist [facility] [phone]

## 2022-01-11 DIAGNOSIS — M810 Age-related osteoporosis without current pathological fracture: Secondary | ICD-10-CM

## 2022-01-11 DIAGNOSIS — I10 Essential (primary) hypertension: Secondary | ICD-10-CM | POA: Diagnosis not present

## 2022-01-11 DIAGNOSIS — E785 Hyperlipidemia, unspecified: Secondary | ICD-10-CM | POA: Diagnosis not present

## 2022-01-30 ENCOUNTER — Ambulatory Visit (INDEPENDENT_AMBULATORY_CARE_PROVIDER_SITE_OTHER): Payer: Medicare Other | Admitting: Podiatry

## 2022-01-30 DIAGNOSIS — M79675 Pain in left toe(s): Secondary | ICD-10-CM | POA: Diagnosis not present

## 2022-01-30 DIAGNOSIS — M79674 Pain in right toe(s): Secondary | ICD-10-CM

## 2022-01-30 DIAGNOSIS — B351 Tinea unguium: Secondary | ICD-10-CM | POA: Diagnosis not present

## 2022-01-30 NOTE — Progress Notes (Signed)
Subjective: Terri Sandoval presents today referred by Mosie Lukes, MD for complaint of with chief concern of diabetes with elongated, thickened, painful, discolored toenails for month. Aggravating factor(s) include wearing enclosed shoegear. Patient has tried self attempt at trimming toenail..   Past Medical History:  Diagnosis Date   Adrenal insufficiency ([place])    Anxiety    Arthritis    Atrophic vaginitis 11/08/2013   BPV (benign positional vertigo) 05/08/2015   Cataract    Chicken pox as a child   Colon polyps    removed during colonscopy   Diverticulitis    Diverticulosis    GERD (gastroesophageal reflux disease)    Hyperlipidemia    Hypertension    Medicare annual wellness visit, subsequent 05/16/2013   Osteoporosis, unspecified 03/09/2013   Preventative health care 11/07/2015   Skin cancer    behind ear   Urine incontinence    sometimes   Verruca 11/07/2015   Vitamin D deficiency 11/07/2015     Patient Active Problem List   Diagnosis Date Noted   Chronic left sacroiliac pain 11/02/2020   Abnormal TSH 11/02/2020   Lip lesion 05/04/2019   Allergy 11/15/2018   Fall 12/04/2017   Hyperglycemia 12/04/2017   Abnormal results of thyroid function studies 05/26/2017   Vitamin D deficiency 11/07/2015   Preventative health care 11/07/2015   Verruca 11/07/2015   BPV (benign positional vertigo) 05/08/2015   Skin cancer 10/31/2014   Atrophic vaginitis 11/08/2013   Cerumen impaction 06/11/2013   Medicare annual wellness visit, subsequent 05/16/2013   Osteoporosis 03/09/2013   GERD (gastroesophageal reflux disease)    Hypertension    Anxiety    Hyperlipidemia    Arthritis    Chicken pox    Diverticulosis    Colon polyps    Urine incontinence      Past Surgical History:  Procedure Laterality Date   ABDOMINAL HYSTERECTOMY  97   has ovaries   BREAST BIOPSY Left     FNA   CATARACT EXTRACTION  L 09/11/10, R 10/10/10   DILATION AND CURETTAGE OF UTERUS  1990, 1993, 1997    EYE SURGERY Bilateral 10/10/2010, 07/28/17   cataracts, posterior capsulotomy b/l   TONSILLECTOMY AND ADENOIDECTOMY  1990     Current Outpatient Medications on File Prior to Visit  Medication Sig Dispense Refill   alclomethasone (ACLOVATE) 0.05 % ointment Apply topically 2 (two) times daily.     Calcium Carbonate Antacid (TUMS PO) Take 1 tablet by mouth as needed.     cetirizine (ZYRTEC) 10 MG tablet Take 10 mg by mouth daily as needed for allergies.     Cholecalciferol (VITAMIN D3) 50 MCG (2000 UT) capsule Take 1 capsule by mouth daily.     fish oil-omega-3 fatty acids 1000 MG capsule Take 1,200 mg by mouth daily.     FLUoxetine (PROZAC) 20 MG capsule TAKE 1 CAPSULE(20 MG) BY MOUTH DAILY 90 capsule 1   glucosamine-chondroitin 500-400 MG tablet Take 1 tablet by mouth daily.     methylcellulose (CITRUCEL) oral powder Use as directed Daily (Patient taking differently: Use as directed Daily if needed)     Multiple Vitamin (MULTIVITAMIN) tablet Take 1 tablet by mouth daily. Vitamin pack from [employer]     mupirocin ointment (BACTROBAN) 2 % 1 application 3 (three) times daily. If needed     Pumpkin Seed-Soy Germ (AZO BLADDER CONTROL/GO-LESS PO) Take by mouth daily.     ramipril (ALTACE) 10 MG capsule TAKE 1 CAPSULE(10 MG) BY MOUTH DAILY 90  capsule 1   simvastatin (ZOCOR) 20 MG tablet TAKE 1 TABLET(20 MG) BY MOUTH DAILY AT 6 PM 90 tablet 1   Skin Protectants, Misc. (AQUAPHOR LIP REPAIR EX) Apply topically at bedtime.     No current facility-administered medications on file prior to visit.     Allergies  Allergen Reactions   Sulfamethoxazole-Trimethoprim Other (See Comments)    Very sore, sore in corner of lip     Social History   Occupational History   Occupation: retired  Tobacco Use   Smoking status: Never   Smokeless tobacco: Never  Vaping Use   Vaping Use: Never used  Substance and Sexual Activity   Alcohol use: No   Drug use: No   Sexual activity: Never    Comment: lives with  husband and disabled son, no dietary restrictions, not exercising regularly     Family History  Problem Relation Age of Onset   Arthritis Mother    Hyperlipidemia Mother    Heart disease Mother    Hypertension Mother    Colon cancer Father    Diabetes Sister        type 2   Heart disease Sister    Hypertension Sister    Hyperlipidemia Sister    Mental illness Maternal Uncle    Stroke Maternal Grandmother    Pneumonia Maternal Grandfather    Cerebral palsy Son        MR   Colon cancer Cousin      Immunization History  Administered Date(s) Administered   Fluad Quad(high Dose 65+) 05/04/2019, 04/19/2021   Influenza, High Dose Seasonal PF 05/09/2016, 05/22/2017, 05/07/2018   Influenza,inj,Quad PF,6+ Mos 04/13/2013, 04/20/2014, 05/08/2015   Influenza-Unspecified 05/04/2020   PFIZER Comirnaty(Gray Top)Covid-19 Tri-Sucrose Vaccine 01/23/2021   PFIZER(Purple Top)SARS-COV-2 Vaccination 08/03/2019, 08/24/2019, 05/29/2020   Pfizer Covid-19 Vaccine Bivalent Booster 9yr & up 05/30/2021   Pneumococcal Conjugate-13 11/12/2013   Pneumococcal Polysaccharide-23 04/25/2011   Tdap 01/12/2010, 09/21/2020   Zoster Recombinat (Shingrix) 06/10/2017, 08/08/2017, 09/08/2017   Zoster, Live 07/16/2011     Objective: Terri DOUGLASSis a pleasant 81y.o. female WD, WN in NAD. AAO x 3.  There were no vitals filed for this visit.   Vascular Examination:  CFT <3 seconds b/l LE. Palpable DP pulse(s) b/l LE. Palpable PT pulse(s) b/l LE. Pedal hair absent. No pain with calf compression b/l. Lower extremity skin temperature gradient within normal limits. No edema noted b/l LE. Varicosities present b/l. No ischemia or gangrene noted b/l LE. No cyanosis or clubbing noted b/l LE.  Dermatological Examination: Pedal skin thin, shiny and atrophic b/l LE. No open wounds b/l LE. No interdigital macerations noted b/l LE. Toenails 1-5 b/l elongated, discolored, dystrophic, thickened, crumbly with subungual debris  and tenderness to dorsal palpation.  Musculoskeletal: Muscle strength 5/5 to all lower extremity muscle groups bilaterally. No pain, crepitus or joint limitation noted with ROM bilateral LE. No gross bony deformities bilaterally. Patient ambulates independent of any assistive aids.  Neurological: Protective sensation intact 5/5 intact bilaterally with 10g monofilament b/l. Vibratory sensation intact b/l. Deep tendon reflexes normal b/l.      Latest Ref Rng & Units 10/23/2021   12:00 PM 04/19/2021    2:10 PM  Hemoglobin A1C  Hemoglobin-A1c 4.6 - 6.5 % 6.2  6.1    Assessment: No diagnosis found.   Plan: -Patient was evaluated and treated. All patient's and/or POA's questions/concerns answered on today's visit. -Consent given for treatment as described below: -Patient to continue soft, supportive shoe  gear daily. -Discussed topical, laser and oral medication for onychomycosis. Patient opted for toenail debridement only on today.  -Patient/POA to call should there be question/concern in the interim.  No follow-ups on file.  Felipa Furnace, DPM

## 2022-02-27 DIAGNOSIS — H524 Presbyopia: Secondary | ICD-10-CM | POA: Diagnosis not present

## 2022-02-27 DIAGNOSIS — H02831 Dermatochalasis of right upper eyelid: Secondary | ICD-10-CM | POA: Diagnosis not present

## 2022-02-27 DIAGNOSIS — H02834 Dermatochalasis of left upper eyelid: Secondary | ICD-10-CM | POA: Diagnosis not present

## 2022-02-27 DIAGNOSIS — H43813 Vitreous degeneration, bilateral: Secondary | ICD-10-CM | POA: Insufficient documentation

## 2022-02-27 DIAGNOSIS — H40003 Preglaucoma, unspecified, bilateral: Secondary | ICD-10-CM | POA: Diagnosis not present

## 2022-02-27 DIAGNOSIS — H35372 Puckering of macula, left eye: Secondary | ICD-10-CM | POA: Diagnosis not present

## 2022-02-27 DIAGNOSIS — H52203 Unspecified astigmatism, bilateral: Secondary | ICD-10-CM | POA: Diagnosis not present

## 2022-02-27 DIAGNOSIS — H5202 Hypermetropia, left eye: Secondary | ICD-10-CM | POA: Diagnosis not present

## 2022-02-27 DIAGNOSIS — H5211 Myopia, right eye: Secondary | ICD-10-CM | POA: Diagnosis not present

## 2022-04-01 DIAGNOSIS — L0390 Cellulitis, unspecified: Secondary | ICD-10-CM | POA: Diagnosis not present

## 2022-04-01 DIAGNOSIS — K130 Diseases of lips: Secondary | ICD-10-CM | POA: Diagnosis not present

## 2022-04-01 DIAGNOSIS — Z129 Encounter for screening for malignant neoplasm, site unspecified: Secondary | ICD-10-CM | POA: Diagnosis not present

## 2022-04-01 DIAGNOSIS — Z85828 Personal history of other malignant neoplasm of skin: Secondary | ICD-10-CM | POA: Diagnosis not present

## 2022-04-01 DIAGNOSIS — L608 Other nail disorders: Secondary | ICD-10-CM | POA: Diagnosis not present

## 2022-04-24 NOTE — Assessment & Plan Note (Addendum)
hgba1c acceptable, minimize simple carbs. Increase exercise as tolerated. Given high dose flu shot today

## 2022-04-24 NOTE — Assessment & Plan Note (Signed)
Encourage heart healthy diet such as MIND or DASH diet, increase exercise, avoid trans fats, simple carbohydrates and processed foods, consider a krill or fish or flaxseed oil cap daily.  °

## 2022-04-24 NOTE — Assessment & Plan Note (Signed)
Encouraged to get adequate exercise, calcium and vitamin d intake. Consider covid booster and high dose flu shot

## 2022-04-24 NOTE — Assessment & Plan Note (Signed)
Well controlled, no changes to meds. Encouraged heart healthy diet such as the DASH diet and exercise as tolerated.  °

## 2022-04-25 ENCOUNTER — Ambulatory Visit (INDEPENDENT_AMBULATORY_CARE_PROVIDER_SITE_OTHER): Payer: Medicare Other | Admitting: Family Medicine

## 2022-04-25 VITALS — BP 122/64 | HR 63 | Temp 98.0°F | Resp 16 | Ht 63.0 in | Wt 143.8 lb

## 2022-04-25 DIAGNOSIS — R600 Localized edema: Secondary | ICD-10-CM

## 2022-04-25 DIAGNOSIS — Z23 Encounter for immunization: Secondary | ICD-10-CM

## 2022-04-25 DIAGNOSIS — I10 Essential (primary) hypertension: Secondary | ICD-10-CM

## 2022-04-25 DIAGNOSIS — M818 Other osteoporosis without current pathological fracture: Secondary | ICD-10-CM

## 2022-04-25 DIAGNOSIS — E559 Vitamin D deficiency, unspecified: Secondary | ICD-10-CM

## 2022-04-25 DIAGNOSIS — K130 Diseases of lips: Secondary | ICD-10-CM | POA: Diagnosis not present

## 2022-04-25 DIAGNOSIS — E782 Mixed hyperlipidemia: Secondary | ICD-10-CM

## 2022-04-25 DIAGNOSIS — R32 Unspecified urinary incontinence: Secondary | ICD-10-CM

## 2022-04-25 DIAGNOSIS — B079 Viral wart, unspecified: Secondary | ICD-10-CM | POA: Diagnosis not present

## 2022-04-25 DIAGNOSIS — R739 Hyperglycemia, unspecified: Secondary | ICD-10-CM | POA: Diagnosis not present

## 2022-04-25 LAB — CBC
HCT: 41.3 % (ref 36.0–46.0)
Hemoglobin: 13.7 g/dL (ref 12.0–15.0)
MCHC: 33.2 g/dL (ref 30.0–36.0)
MCV: 90.4 fl (ref 78.0–100.0)
Platelets: 226 10*3/uL (ref 150.0–400.0)
RBC: 4.57 Mil/uL (ref 3.87–5.11)
RDW: 14.6 % (ref 11.5–15.5)
WBC: 5.9 10*3/uL (ref 4.0–10.5)

## 2022-04-25 LAB — COMPREHENSIVE METABOLIC PANEL
ALT: 19 U/L (ref 0–35)
AST: 25 U/L (ref 0–37)
Albumin: 4.6 g/dL (ref 3.5–5.2)
Alkaline Phosphatase: 65 U/L (ref 39–117)
BUN: 15 mg/dL (ref 6–23)
CO2: 32 mEq/L (ref 19–32)
Calcium: 10.1 mg/dL (ref 8.4–10.5)
Chloride: 98 mEq/L (ref 96–112)
Creatinine, Ser: 0.85 mg/dL (ref 0.40–1.20)
GFR: 64.54 mL/min (ref >60.00)
Glucose, Bld: 83 mg/dL (ref 70–99)
Potassium: 4.2 mEq/L (ref 3.5–5.1)
Sodium: 136 mEq/L (ref 135–145)
Total Bilirubin: 0.5 mg/dL (ref 0.2–1.2)
Total Protein: 7.7 g/dL (ref 6.0–8.3)

## 2022-04-25 LAB — HEMOGLOBIN A1C: Hgb A1c MFr Bld: 6.2 % (ref 4.6–6.5)

## 2022-04-25 LAB — LIPID PANEL
Cholesterol: 176 mg/dL (ref 0–200)
HDL: 76.7 mg/dL (ref >39.00)
LDL Cholesterol: 78 mg/dL (ref 0–99)
NonHDL: 98.99
Total CHOL/HDL Ratio: 2
Triglycerides: 103 mg/dL (ref 0.0–149.0)
VLDL: 20.6 mg/dL (ref 0.0–40.0)

## 2022-04-25 LAB — TSH: TSH: 4.06 u[IU]/mL (ref 0.35–5.50)

## 2022-04-25 LAB — VITAMIN D 25 HYDROXY (VIT D DEFICIENCY, FRACTURES): VITD: 66.06 ng/mL (ref 30.00–100.00)

## 2022-04-25 NOTE — Patient Instructions (Signed)
RSV (respiratory syncitial virus) vaccine at pharmacy Covid booster when new version out late September At pharmacy High dose flu shot given today

## 2022-04-25 NOTE — Assessment & Plan Note (Signed)
Left 3rd finger recently flared and mupirocin was helpful. Only flares and is red and painful a couple times a year. Likely a secondary bacterial infection

## 2022-04-25 NOTE — Progress Notes (Signed)
Subjective:   By signing my name below, I, Kellie Simmering, attest that this documentation has been prepared under the direction and in the presence of Mosie Lukes, MD 04/25/2022.     Patient ID: Terri Sandoval, female    DOB: 01-08-1941, 80 y.o.   MRN: [id number]  Chief Complaint  Patient presents with   Follow-up    Here for follow up   HPI Patient is in today for an office visit.  Bladder: She is currently taking AZO to control her bladder and states that it has been effective. She says that she does occasionally experience urinary difficulties and wears a pad when in public.   Dermatology: She states that her dermatologist, Dr. Linus Galas, prescribed Mupirocin 2% ointment to manage mild cellulitis on her left third finger, which was effective, and also Aquaphor to manage irritation on her upper lip.   Immunizations: She has been informed about receiving COVID-19 and RSV immunizations. She received the high-dose Flu immunization today.   Left Foot Swelling: She reports that her left foot became swollen in 12/2021 after taking a road trip. She further reports that the swelling lasted several months and eventually subsided in 03/2022.   Ophthalmology: She has an appointment with her ophthalmologist, Dr. Rhae Lerner, in 08/2022 to follow up on the status of possible glaucoma.   Past Medical History:  Diagnosis Date   Adrenal insufficiency ([place])    Anxiety    Arthritis    Atrophic vaginitis 11/08/2013   BPV (benign positional vertigo) 05/08/2015   Cataract    Chicken pox as a child   Colon polyps    removed during colonscopy   Diverticulitis    Diverticulosis    GERD (gastroesophageal reflux disease)    Hyperlipidemia    Hypertension    Medicare annual wellness visit, subsequent 05/16/2013   Osteoporosis, unspecified 03/09/2013   Preventative health care 11/07/2015   Skin cancer    behind ear   Urine incontinence    sometimes   Verruca 11/07/2015   Vitamin D  deficiency 11/07/2015   Past Surgical History:  Procedure Laterality Date   ABDOMINAL HYSTERECTOMY  97   has ovaries   BREAST BIOPSY Left     FNA   CATARACT EXTRACTION  L 09/11/10, R 10/10/10   DILATION AND CURETTAGE OF UTERUS  1990, 1993, 1997   EYE SURGERY Bilateral 10/10/2010, 07/28/17   cataracts, posterior capsulotomy b/l   TONSILLECTOMY AND ADENOIDECTOMY  1990   Family History  Problem Relation Age of Onset   Arthritis Mother    Hyperlipidemia Mother    Heart disease Mother    Hypertension Mother    Colon cancer Father    Diabetes Sister        type 2   Heart disease Sister    Hypertension Sister    Hyperlipidemia Sister    Mental illness Maternal Uncle    Stroke Maternal Grandmother    Pneumonia Maternal Grandfather    Cerebral palsy Son        MR   Colon cancer Cousin    Social History   Socioeconomic History   Marital status: Married    Spouse name: Not on file   Number of children: 2   Years of education: Not on file   Highest education level: Not on file  Occupational History   Occupation: retired  Tobacco Use   Smoking status: Never   Smokeless tobacco: Never  Vaping Use   Vaping Use: Never used  Substance and Sexual Activity   Alcohol use: No   Drug use: No   Sexual activity: Never    Comment: lives with husband and disabled son, no dietary restrictions, not exercising regularly  Other Topics Concern   Not on file  Social History Narrative   Not on file   Social Determinants of Health   Financial Resource Strain: Low Risk  (12/24/2021)   Overall Financial Resource Strain (CARDIA)    Difficulty of Paying Living Expenses: Not hard at all  Food Insecurity: No Food Insecurity (04/19/2021)   Hunger Vital Sign    Worried About Running Out of Food in the Last Year: Never true    Ran Out of Food in the Last Year: Never true  Transportation Needs: No Transportation Needs (12/24/2021)   PRAPARE - Hydrologist (Medical): No     Lack of Transportation (Non-Medical): No  Physical Activity: Insufficiently Active (06/25/2021)   Exercise Vital Sign    Days of Exercise per Week: 4 days    Minutes of Exercise per Session: 30 min  Stress: No Stress Concern Present (04/19/2021)   [place]    Feeling of Stress : Not at all  Social Connections: Moderately Integrated (04/19/2021)   Social Connection and Isolation Panel [NHANES]    Frequency of Communication with Friends and Family: Once a week    Frequency of Social Gatherings with Friends and Family: More than three times a week    Attends Religious Services: 1 to 4 times per year    Active Member of [employer] or Organizations: No    Attends Archivist Meetings: Never    Marital Status: Married  Human resources officer Violence: Not At Risk (06/25/2021)   Humiliation, Afraid, Rape, and Kick questionnaire    Fear of Current or Ex-Partner: No    Emotionally Abused: No    Physically Abused: No    Sexually Abused: No   Outpatient Medications Prior to Visit  Medication Sig Dispense Refill   alclomethasone (ACLOVATE) 0.05 % ointment Apply topically 2 (two) times daily.     Calcium Carbonate Antacid (TUMS PO) Take 1 tablet by mouth as needed.     cetirizine (ZYRTEC) 10 MG tablet Take 10 mg by mouth daily as needed for allergies.     Cholecalciferol (VITAMIN D3) 50 MCG (2000 UT) capsule Take 1 capsule by mouth daily.     fish oil-omega-3 fatty acids 1000 MG capsule Take 1,200 mg by mouth daily.     FLUoxetine (PROZAC) 20 MG capsule TAKE 1 CAPSULE(20 MG) BY MOUTH DAILY 90 capsule 1   glucosamine-chondroitin 500-400 MG tablet Take 1 tablet by mouth daily.     methylcellulose (CITRUCEL) oral powder Use as directed Daily (Patient taking differently: Use as directed Daily if needed)     Multiple Vitamin (MULTIVITAMIN) tablet Take 1 tablet by mouth daily. Vitamin pack from [facility]     mupirocin ointment (BACTROBAN) 2  % 1 application 3 (three) times daily. If needed     Pumpkin Seed-Soy Germ (AZO BLADDER CONTROL/GO-LESS PO) Take by mouth daily.     ramipril (ALTACE) 10 MG capsule TAKE 1 CAPSULE(10 MG) BY MOUTH DAILY 90 capsule 1   simvastatin (ZOCOR) 20 MG tablet TAKE 1 TABLET(20 MG) BY MOUTH DAILY AT 6 PM 90 tablet 1   Skin Protectants, Misc. (AQUAPHOR LIP REPAIR EX) Apply topically at bedtime.     No facility-administered medications prior to visit.  Allergies  Allergen Reactions   Sulfamethoxazole-Trimethoprim Other (See Comments)    Very sore, sore in corner of lip   ROS    Objective:    Physical Exam Constitutional:      General: She is not in acute distress.    Appearance: Normal appearance. She is not ill-appearing.  HENT:     Head: Normocephalic and atraumatic.     Right Ear: Tympanic membrane, ear canal and external ear normal.     Left Ear: Tympanic membrane, ear canal and external ear normal.     Mouth/Throat:     Mouth: Mucous membranes are moist.     Pharynx: Oropharynx is clear.  Eyes:     Extraocular Movements: Extraocular movements intact.     Pupils: Pupils are equal, round, and reactive to light.  Cardiovascular:     Rate and Rhythm: Normal rate and regular rhythm.     Pulses: Normal pulses.     Heart sounds: Normal heart sounds. No murmur heard.    No gallop.  Pulmonary:     Effort: Pulmonary effort is normal. No respiratory distress.     Breath sounds: Normal breath sounds. No wheezing or rales.  Abdominal:     General: Bowel sounds are normal.  Skin:    General: Skin is warm and dry.  Neurological:     Mental Status: She is alert and oriented to person, place, and time.  Psychiatric:        Mood and Affect: Mood normal.        Behavior: Behavior normal.        Judgment: Judgment normal.    BP 122/64 (BP Location: Left Arm, Patient Position: Sitting, Cuff Size: Small)   Pulse 63   Temp 98 F (36.7 C) (Oral)   Resp 16   Ht '5\' 3"'$  (1.6 m)   Wt 143 lb 12.8  oz (65.2 kg)   SpO2 99%   BMI 25.47 kg/m  Wt Readings from Last 3 Encounters:  04/25/22 143 lb 12.8 oz (65.2 kg)  10/23/21 144 lb (65.3 kg)  04/19/21 142 lb (64.4 kg)   Diabetic Foot Exam - Simple   No data filed    Lab Results  Component Value Date   WBC 5.9 04/25/2022   HGB 13.7 04/25/2022   HCT 41.3 04/25/2022   PLT 226.0 04/25/2022   GLUCOSE 83 04/25/2022   CHOL 176 04/25/2022   TRIG 103.0 04/25/2022   HDL 76.70 04/25/2022   LDLCALC 78 04/25/2022   ALT 19 04/25/2022   AST 25 04/25/2022   NA 136 04/25/2022   K 4.2 04/25/2022   CL 98 04/25/2022   CREATININE 0.85 04/25/2022   BUN 15 04/25/2022   CO2 32 04/25/2022   TSH 4.06 04/25/2022   HGBA1C 6.2 04/25/2022   MICROALBUR <0.7 05/04/2018   Lab Results  Component Value Date   TSH 4.06 04/25/2022   Lab Results  Component Value Date   WBC 5.9 04/25/2022   HGB 13.7 04/25/2022   HCT 41.3 04/25/2022   MCV 90.4 04/25/2022   PLT 226.0 04/25/2022   Lab Results  Component Value Date   NA 136 04/25/2022   K 4.2 04/25/2022   CO2 32 04/25/2022   GLUCOSE 83 04/25/2022   BUN 15 04/25/2022   CREATININE 0.85 04/25/2022   BILITOT 0.5 04/25/2022   ALKPHOS 65 04/25/2022   AST 25 04/25/2022   ALT 19 04/25/2022   PROT 7.7 04/25/2022   ALBUMIN 4.6 04/25/2022   CALCIUM  10.1 04/25/2022   GFR 64.54 04/25/2022   Lab Results  Component Value Date   CHOL 176 04/25/2022   Lab Results  Component Value Date   HDL 76.70 04/25/2022   Lab Results  Component Value Date   LDLCALC 78 04/25/2022   Lab Results  Component Value Date   TRIG 103.0 04/25/2022   Lab Results  Component Value Date   CHOLHDL 2 04/25/2022   Lab Results  Component Value Date   HGBA1C 6.2 04/25/2022      Assessment & Plan:   Problem List Items Addressed This Visit     Hypertension    Well controlled, no changes to meds. Encouraged heart healthy diet such as the DASH diet and exercise as tolerated.       Relevant Orders   CBC  (Completed)   Comprehensive metabolic panel (Completed)   TSH (Completed)   Hyperlipidemia    Encourage heart healthy diet such as MIND or DASH diet, increase exercise, avoid trans fats, simple carbohydrates and processed foods, consider a krill or fish or flaxseed oil cap daily.       Relevant Orders   Lipid panel (Completed)   Urine incontinence    Using cranberry tabs and doing well only wears a pad when she goes out.       Osteoporosis    Encouraged to get adequate exercise, calcium and vitamin d intake. Consider covid booster and high dose flu shot      Vitamin D deficiency   Relevant Orders   VITAMIN D 25 Hydroxy (Vit-D Deficiency, Fractures) (Completed)   Verruca    Left 3rd finger recently flared and mupirocin was helpful. Only flares and is red and painful a couple times a year. Likely a secondary bacterial infection      Hyperglycemia    hgba1c acceptable, minimize simple carbs. Increase exercise as tolerated. Given high dose flu shot today      Relevant Orders   Hemoglobin A1c (Completed)   Lip lesion    Sees Derm      Pedal edema    Flared with a long car ride is better now, use compression hose for next drive and stop every hour      Other Visit Diagnoses     Need for influenza vaccination    -  Primary   Relevant Orders   Flu Vaccine QUAD High Dose(Fluad) (Completed)      No orders of the defined types were placed in this encounter.  I, Penni Homans, MD, personally preformed the services described in this documentation.  All medical record entries made by the scribe were at my direction and in my presence.  I have reviewed the chart and discharge instructions (if applicable) and agree that the record reflects my personal performance and is accurate and complete. 04/25/2022  I,Mohammed Iqbal,acting as a scribe for Penni Homans, MD.,have documented all relevant documentation on the behalf of Penni Homans, MD,as directed by  Penni Homans, MD while in the  presence of Penni Homans, MD.  Penni Homans, MD

## 2022-04-25 NOTE — Assessment & Plan Note (Signed)
Flared with a long car ride is better now, use compression hose for next drive and stop every hour

## 2022-04-25 NOTE — Assessment & Plan Note (Signed)
Using cranberry tabs and doing well only wears a pad when she goes out.

## 2022-04-25 NOTE — Assessment & Plan Note (Signed)
Sees Derm

## 2022-05-10 ENCOUNTER — Telehealth: Payer: Self-pay | Admitting: Family Medicine

## 2022-05-10 NOTE — Telephone Encounter (Signed)
Citrucel has been in short supply due to increase in demand, manufacturing issues and supply chain issues due to [place]. Recommended she try to see if she can find store brand methylcellulose as it is better than Metamucil / psyllium when trying to add bulk to her stool.   She can also try Benefiber powder but prefer Citracel / methylcellulose if she can find.

## 2022-05-10 NOTE — Telephone Encounter (Signed)
Pt called stating that she wanted to ask Tammy a question regarding Citrucel. Pt is having issues getting it and was wondering if there were any alternatives that she could use. Pt would like a call back when possible.

## 2022-05-14 ENCOUNTER — Other Ambulatory Visit: Payer: Self-pay | Admitting: Family Medicine

## 2022-05-15 ENCOUNTER — Ambulatory Visit (INDEPENDENT_AMBULATORY_CARE_PROVIDER_SITE_OTHER): Payer: Medicare Other | Admitting: Podiatry

## 2022-05-15 DIAGNOSIS — M79674 Pain in right toe(s): Secondary | ICD-10-CM | POA: Diagnosis not present

## 2022-05-15 DIAGNOSIS — M79675 Pain in left toe(s): Secondary | ICD-10-CM

## 2022-05-15 DIAGNOSIS — B351 Tinea unguium: Secondary | ICD-10-CM | POA: Diagnosis not present

## 2022-05-15 NOTE — Progress Notes (Signed)
  Subjective:  Patient ID: Terri Sandoval, female    DOB: 01-02-1941,  MRN: [id number]  Terri Sandoval presents to clinic today for painful elongated mycotic toenails 1-5 bilaterally which are tender when wearing enclosed shoe gear. Pain is relieved with periodic professional debridement.. Chief Complaint  Patient presents with   Nail Problem    Routine foot care PCP-[facility] PCP VST-04/25/2022   New problem(s): None.   PCP is Mosie Lukes, MD , and last visit was April 25, 2022.  Allergies  Allergen Reactions   Sulfamethoxazole-Trimethoprim Other (See Comments)    Very sore, sore in corner of lip   Review of Systems: Negative except as noted in the HPI.  Objective: No changes noted in today's physical examination.  Terri Sandoval is a pleasant 81 y.o. female in NAD. AAO x 3.  Vascular Examination:  CFT <3 seconds b/l LE. Palpable DP pulse(s) b/l LE. Palpable PT pulse(s) b/l LE. Pedal hair absent. No pain with calf compression b/l. Lower extremity skin temperature gradient within normal limits. No edema noted b/l LE. Varicosities present b/l. No ischemia or gangrene noted b/l LE. No cyanosis or clubbing noted b/l LE.  Dermatological Examination: Pedal skin thin, shiny and atrophic b/l LE. No open wounds b/l LE. No interdigital macerations noted b/l LE. Toenails 1-5 b/l elongated, discolored, dystrophic, thickened, crumbly with subungual debris and tenderness to dorsal palpation.  Musculoskeletal: Muscle strength 5/5 to all lower extremity muscle groups bilaterally. No pain, crepitus or joint limitation noted with ROM bilateral LE. No gross bony deformities bilaterally. Patient ambulates independent of any assistive aids.  Neurological: Protective sensation intact 5/5 intact bilaterally with 10g monofilament b/l. Vibratory sensation intact b/l. Deep tendon reflexes normal b/l.   Assessment/Plan: 1. Pain due to onychomycosis of toenails of both feet     No orders of the defined  types were placed in this encounter.   -Consent given for treatment as described below: -Examined patient. -Continue supportive shoe gear daily. -Toenails 1-5 b/l were debrided in length and girth with sterile nail nippers and dremel without iatrogenic bleeding.  -Patient/POA to call should there be question/concern in the interim.   Return in about 3 months (around 08/15/2022).  Marzetta Board, DPM

## 2022-05-19 ENCOUNTER — Encounter: Payer: Self-pay | Admitting: Podiatry

## 2022-05-22 ENCOUNTER — Ambulatory Visit (INDEPENDENT_AMBULATORY_CARE_PROVIDER_SITE_OTHER): Payer: Medicare Other

## 2022-05-22 VITALS — BP 123/62 | Temp 97.4°F | Wt 143.0 lb

## 2022-05-22 DIAGNOSIS — Z0000 Encounter for general adult medical examination without abnormal findings: Secondary | ICD-10-CM | POA: Diagnosis not present

## 2022-05-22 NOTE — Patient Instructions (Signed)
Terri Sandoval , Thank you for taking time to come for your Medicare Wellness Visit. I appreciate your ongoing commitment to your health goals. Please review the following plan we discussed and let me know if I can assist you in the future.   These are the goals we discussed:  Goals       Chronic Care Management Pharmacy Care Plan      CARE PLAN ENTRY (see longitudinal plan of care for additional care plan information)  Current Barriers:  Chronic Disease Management support, education, and care coordination needs related to Hypertension, Hyperlipidemia, Pre-Diabetes, Depression, Osteoporosis, Allergic Rhinitis   Hypertension BP Readings from Last 3 Encounters:  10/26/21 (!) 114/58  10/23/21 116/70  06/25/21 114/70  Pharmacist Clinical Goal(s): Over the next 180 days, patient will work with PharmD and providers to maintain BP goal <140/90 Current regimen:  Ramipiril '10mg'$  daily Interventions: Discussed blood pressure goal Patient self care activities - Over the next 180 days, patient will: Maintain current hypertension medication regimen.   Hyperlipidemia Lab Results  Component Value Date/Time   LDLCALC 68 10/23/2021 12:00 PM   Mora 76 05/16/2020 08:12 AM  Pharmacist Clinical Goal(s): Over the next 180 days, patient will work with PharmD and providers to maintain LDL goal < 100 Current regimen:  Simvastatin '20mg'$  daily Fish Oil '1200mg'$  daily Interventions: Discussed LDL goal Patient self care activities - Over the next 180 days, patient will: Maintain current cholesterol medication regimen.   Pre-Diabetes Lab Results  Component Value Date/Time   HGBA1C 6.2 10/23/2021 12:00 PM   HGBA1C 6.1 04/19/2021 02:10 PM  Pharmacist Clinical Goal(s): Over the next 180 days, patient will work with PharmD and providers to maintain A1c goal <6.5% Current regimen:  Diet and exercise management   Interventions: Discussed A1c goal Discussed diet and exercie Patient self care activities -  Over the next 180 days, patient will: Maintain a1c less than 6.5% Continue to limit intake of sugar containing foods and beverages Continue to use Cubbi exerciser - goal is to get at least 150 minutes of exercise per week.    Osteoporosis Pharmacist Clinical Goal(s) Over the next 180 days, patient will work with PharmD and providers to reduce risk of fracture due to osteoporosis Current regimen:  Vitamin D 2000 units daily Multi-vitamin  with calcium daily Tums if needed for heartburn Interventions: Reviewed last bone density results Reviewed calcium intake form diet and supplements Patient self care activities - Over the next 180 days, patient will: Consider Prolia for bone health Continue to practice home safety to prevent falls.  Continue to eat calcium rich foods and take multivitamin daily - goal is to get '1200mg'$  of calcium from combination of foods and supplements daily   Medication management Pharmacist Clinical Goal(s): Over the next 180 days, patient will work with PharmD and providers to maintain optimal medication adherence Current pharmacy: [facility] Interventions Comprehensive medication review performed. Continue current medication management strategy Patient self care activities - Over the next 180 days, patient will: Focus on medication adherence by filling and taking medications appropriately  Take medications as prescribed Report any questions or concerns to PharmD and/or provider(s)   Patient Goals/Self-Care Activities Over the next 180 days, patient will:  take medications as prescribed,  Target a minimum of 150 minutes of moderate intensity exercise weekly Monitor blood glucose at home - if starts to increase, then change Citrucel regular back to sugar free version if you can find or try Benefiber   Please see past updates related  to this goal by clicking on the "Past Updates" button in the selected goal        Healthy Lifestyle      Continue to eat  heart healthy diet (full of fruits, vegetables, whole grains, lean protein, water--limit salt, fat, and sugar intake) and increase physical activity as tolerated. Continue doing brain stimulating activities (puzzles, reading, adult coloring books, staying active) to keep memory sharp.        Increase physical activity      Maintain current health (pt-stated)      Patient Stated      Start exercise on the qbee         This is a list of the screening recommended for you and due dates:  Health Maintenance  Topic Date Due   COVID-19 Vaccine (6 - Pfizer risk series) 07/25/2021   Medicare Annual Wellness Visit  05/23/2023   Tetanus Vaccine  09/22/2030   Pneumonia Vaccine  Completed   Flu Shot  Completed   DEXA scan (bone density measurement)  Completed   Zoster (Shingles) Vaccine  Completed   HPV Vaccine  Aged Out    Advanced directives: copies in chart   Conditions/risks identified: start q bee exercise   Next appointment: Follow up in one year for your annual wellness visit    Preventive Care 65 Years and Older, Female Preventive care refers to lifestyle choices and visits with your health care provider that can promote health and wellness. What does preventive care include? A yearly physical exam. This is also called an annual well check. Dental exams once or twice a year. Routine eye exams. Ask your health care provider how often you should have your eyes checked. Personal lifestyle choices, including: Daily care of your teeth and gums. Regular physical activity. Eating a healthy diet. Avoiding tobacco and drug use. Limiting alcohol use. Practicing safe sex. Taking low-dose aspirin every day. Taking vitamin and mineral supplements as recommended by your health care provider. What happens during an annual well check? The services and screenings done by your health care provider during your annual well check will depend on your age, overall health, lifestyle risk factors,  and family history of disease. Counseling  Your health care provider may ask you questions about your: Alcohol use. Tobacco use. Drug use. Emotional well-being. Home and relationship well-being. Sexual activity. Eating habits. History of falls. Memory and ability to understand (cognition). Work and work Statistician. Reproductive health. Screening  You may have the following tests or measurements: Height, weight, and BMI. Blood pressure. Lipid and cholesterol levels. These may be checked every 5 years, or more frequently if you are over 70 years old. Skin check. Lung cancer screening. You may have this screening every year starting at age 25 if you have a 30-pack-year history of smoking and currently smoke or have quit within the past 15 years. Fecal occult blood test (FOBT) of the stool. You may have this test every year starting at age 22. Flexible sigmoidoscopy or colonoscopy. You may have a sigmoidoscopy every 5 years or a colonoscopy every 10 years starting at age 2. Hepatitis C blood test. Hepatitis B blood test. Sexually transmitted disease (STD) testing. Diabetes screening. This is done by checking your blood sugar (glucose) after you have not eaten for a while (fasting). You may have this done every 1-3 years. Bone density scan. This is done to screen for osteoporosis. You may have this done starting at age 80. Mammogram. This may be done every 1-2  years. Talk to your health care provider about how often you should have regular mammograms. Talk with your health care provider about your test results, treatment options, and if necessary, the need for more tests. Vaccines  Your health care provider may recommend certain vaccines, such as: Influenza vaccine. This is recommended every year. Tetanus, diphtheria, and acellular pertussis (Tdap, Td) vaccine. You may need a Td booster every 10 years. Zoster vaccine. You may need this after age 26. Pneumococcal 13-valent conjugate  (PCV13) vaccine. One dose is recommended after age 9. Pneumococcal polysaccharide (PPSV23) vaccine. One dose is recommended after age 38. Talk to your health care provider about which screenings and vaccines you need and how often you need them. This information is not intended to replace advice given to you by your health care provider. Make sure you discuss any questions you have with your health care provider. Document Released: 07/28/2015 Document Revised: 03/20/2016 Document Reviewed: 05/02/2015 Elsevier Interactive Patient Education  2017 [employer] Prevention in the Home Falls can cause injuries. They can happen to people of all ages. There are many things you can do to make your home safe and to help prevent falls. What can I do on the outside of my home? Regularly fix the edges of walkways and driveways and fix any cracks. Remove anything that might make you trip as you walk through a door, such as a raised step or threshold. Trim any bushes or trees on the path to your home. Use bright outdoor lighting. Clear any walking paths of anything that might make someone trip, such as rocks or tools. Regularly check to see if handrails are loose or broken. Make sure that both sides of any steps have handrails. Any raised decks and porches should have guardrails on the edges. Have any leaves, snow, or ice cleared regularly. Use sand or salt on walking paths during winter. Clean up any spills in your garage right away. This includes oil or grease spills. What can I do in the bathroom? Use night lights. Install grab bars by the toilet and in the tub and shower. Do not use towel bars as grab bars. Use non-skid mats or decals in the tub or shower. If you need to sit down in the shower, use a plastic, non-slip stool. Keep the floor dry. Clean up any water that spills on the floor as soon as it happens. Remove soap buildup in the tub or shower regularly. Attach bath mats securely with  double-sided non-slip rug tape. Do not have throw rugs and other things on the floor that can make you trip. What can I do in the bedroom? Use night lights. Make sure that you have a light by your bed that is easy to reach. Do not use any sheets or blankets that are too big for your bed. They should not hang down onto the floor. Have a firm chair that has side arms. You can use this for support while you get dressed. Do not have throw rugs and other things on the floor that can make you trip. What can I do in the kitchen? Clean up any spills right away. Avoid walking on wet floors. Keep items that you use a lot in easy-to-reach places. If you need to reach something above you, use a strong step stool that has a grab bar. Keep electrical cords out of the way. Do not use floor polish or wax that makes floors slippery. If you must use wax, use non-skid floor  wax. Do not have throw rugs and other things on the floor that can make you trip. What can I do with my stairs? Do not leave any items on the stairs. Make sure that there are handrails on both sides of the stairs and use them. Fix handrails that are broken or loose. Make sure that handrails are as long as the stairways. Check any carpeting to make sure that it is firmly attached to the stairs. Fix any carpet that is loose or worn. Avoid having throw rugs at the top or bottom of the stairs. If you do have throw rugs, attach them to the floor with carpet tape. Make sure that you have a light switch at the top of the stairs and the bottom of the stairs. If you do not have them, ask someone to add them for you. What else can I do to help prevent falls? Wear shoes that: Do not have high heels. Have rubber bottoms. Are comfortable and fit you well. Are closed at the toe. Do not wear sandals. If you use a stepladder: Make sure that it is fully opened. Do not climb a closed stepladder. Make sure that both sides of the stepladder are locked  into place. Ask someone to hold it for you, if possible. Clearly mark and make sure that you can see: Any grab bars or handrails. First and last steps. Where the edge of each step is. Use tools that help you move around (mobility aids) if they are needed. These include: Canes. Walkers. Scooters. Crutches. Turn on the lights when you go into a dark area. Replace any light bulbs as soon as they burn out. Set up your furniture so you have a clear path. Avoid moving your furniture around. If any of your floors are uneven, fix them. If there are any pets around you, be aware of where they are. Review your medicines with your doctor. Some medicines can make you feel dizzy. This can increase your chance of falling. Ask your doctor what other things that you can do to help prevent falls. This information is not intended to replace advice given to you by your health care provider. Make sure you discuss any questions you have with your health care provider. Document Released: 04/27/2009 Document Revised: 12/07/2015 Document Reviewed: 08/05/2014 Elsevier Interactive Patient Education  2017 [employer].

## 2022-05-22 NOTE — Progress Notes (Cosign Needed Addendum)
I connected with  [name] on 05/22/22 by a audio enabled telemedicine application and verified that I am speaking with the correct person using two identifiers.  Patient Location: Home  Provider Location: Home Office  I discussed the limitations of evaluation and management by telemedicine. The patient expressed understanding and agreed to proceed.   Subjective:   Terri Sandoval is a 81 y.o. female who presents for Medicare Annual (Subsequent) preventive examination.  Review of Systems     Cardiac Risk Factors include: advanced age (>59mn, >>75women);dyslipidemia;hypertension     Objective:    Today's Vitals   05/22/22 1259  BP: 123/62  Temp: (!) 97.4 F (36.3 C)  SpO2: 99%  Weight: 143 lb (64.9 kg)   Body mass index is 25.33 kg/m.     05/22/2022    1:10 PM 04/19/2021    2:24 PM 01/04/2020   11:10 AM 07/25/2017   11:28 AM 05/09/2016   10:09 AM 11/07/2015   11:19 AM 05/08/2015   11:31 AM  Advanced Directives  Does Patient Have a Medical Advance Directive? Yes Yes Yes Yes Yes Yes Yes  Type of AParamedicof AElizabethtownLiving will HBeec[place]Living will HOutlookLiving will HDuPageLiving will HWell[place]Living will  HHi-NellaLiving will  Does patient want to make changes to medical advance directive? No - Patient declined  No - Patient declined    Yes - information given  Copy of HCrystal Lakein Chart? Yes - validated most recent copy scanned in chart (See row information) Yes - validated most recent copy scanned in chart (See row information) Yes - validated most recent copy scanned in chart (See row information) Yes Yes  No - copy requested    Current Medications (verified) Outpatient Encounter Medications as of 05/22/2022  Medication Sig   alclomethasone (ACLOVATE) 0.05 % ointment Apply topically 2 (two) times daily.   Calcium  Carbonate Antacid (TUMS PO) Take 1 tablet by mouth as needed.   cetirizine (ZYRTEC) 10 MG tablet Take 10 mg by mouth daily as needed for allergies.   Cholecalciferol (VITAMIN D3) 50 MCG (2000 UT) capsule Take 1 capsule by mouth daily.   fish oil-omega-3 fatty acids 1000 MG capsule Take 1,200 mg by mouth daily.   FLUoxetine (PROZAC) 20 MG capsule TAKE 1 CAPSULE(20 MG) BY MOUTH DAILY   glucosamine-chondroitin 500-400 MG tablet Take 1 tablet by mouth daily.   methylcellulose (CITRUCEL) oral powder Use as directed Daily (Patient taking differently: every other day. Use as directed Daily if needed)   Multiple Vitamin (MULTIVITAMIN) tablet Take 1 tablet by mouth daily. Vitamin pack from [facility]   mupirocin ointment (BACTROBAN) 2 % 1 application 3 (three) times daily. If needed   phenazopyridine (PYRIDIUM) 95 MG tablet Take 95 mg by mouth as needed for pain (azo).   ramipril (ALTACE) 10 MG capsule TAKE 1 CAPSULE(10 MG) BY MOUTH DAILY   simvastatin (ZOCOR) 20 MG tablet TAKE 1 TABLET(20 MG) BY MOUTH DAILY AT 6 PM   Skin Protectants, Misc. (AQUAPHOR LIP REPAIR EX) Apply topically at bedtime.   [DISCONTINUED] Pumpkin Seed-Soy Germ (AZO BLADDER CONTROL/GO-LESS PO) Take by mouth daily.   No facility-administered encounter medications on file as of 05/22/2022.    Allergies (verified) Sulfamethoxazole-trimethoprim   History: Past Medical History:  Diagnosis Date   Adrenal insufficiency (HCC)    Anxiety    Arthritis    Atrophic vaginitis 11/08/2013  BPV (benign positional vertigo) 05/08/2015   Cataract    Chicken pox as a child   Colon polyps    removed during colonscopy   Diverticulitis    Diverticulosis    GERD (gastroesophageal reflux disease)    Hyperlipidemia    Hypertension    Medicare annual wellness visit, subsequent 05/16/2013   Osteoporosis, unspecified 03/09/2013   Preventative health care 11/07/2015   Skin cancer    behind ear   Urine incontinence    sometimes   Verruca 11/07/2015    Vitamin D deficiency 11/07/2015   Past Surgical History:  Procedure Laterality Date   ABDOMINAL HYSTERECTOMY  97   has ovaries   BREAST BIOPSY Left     FNA   CATARACT EXTRACTION  L 09/11/10, R 10/10/10   DILATION AND CURETTAGE OF UTERUS  1990, 1993, 1997   EYE SURGERY Bilateral 10/10/2010, 07/28/17   cataracts, posterior capsulotomy b/l   TONSILLECTOMY AND ADENOIDECTOMY  1990   Family History  Problem Relation Age of Onset   Arthritis Mother    Hyperlipidemia Mother    Heart disease Mother    Hypertension Mother    Colon cancer Father    Diabetes Sister        type 2   Heart disease Sister    Hypertension Sister    Hyperlipidemia Sister    Mental illness Maternal Uncle    Stroke Maternal Grandmother    Pneumonia Maternal Grandfather    Cerebral palsy Son        MR   Colon cancer Cousin    Social History   Socioeconomic History   Marital status: Married    Spouse name: Not on file   Number of children: 2   Years of education: Not on file   Highest education level: Not on file  Occupational History   Occupation: retired  Tobacco Use   Smoking status: Never   Smokeless tobacco: Never  Vaping Use   Vaping Use: Never used  Substance and Sexual Activity   Alcohol use: No   Drug use: No   Sexual activity: Never    Comment: lives with husband and disabled son, no dietary restrictions, not exercising regularly  Other Topics Concern   Not on file  Social History Narrative   Not on file   Social Determinants of Health   Financial Resource Strain: Low Risk  (05/22/2022)   Overall Financial Resource Strain (CARDIA)    Difficulty of Paying Living Expenses: Not hard at all  Food Insecurity: No Food Insecurity (05/22/2022)   Hunger Vital Sign    Worried About Estate manager/land agent of Food in the Last Year: Never true    Ran Out of Food in the Last Year: Never true  Transportation Needs: No Transportation Needs (05/22/2022)   PRAPARE - Hydrologist  (Medical): No    Lack of Transportation (Non-Medical): No  Physical Activity: Inactive (05/22/2022)   Exercise Vital Sign    Days of Exercise per Week: 0 days    Minutes of Exercise per Session: 0 min  Stress: No Stress Concern Present (05/22/2022)   [place]    Feeling of Stress : Not at all  Social Connections: Moderately Integrated (05/22/2022)   Social Connection and Isolation Panel [NHANES]    Frequency of Communication with Friends and Family: More than three times a week    Frequency of Social Gatherings with Friends and Family: Twice a week  Attends Religious Services: More than 4 times per year    Active Member of Clubs or Organizations: No    Attends Archivist Meetings: Never    Marital Status: Married    Tobacco Counseling Counseling given: Not Answered   Clinical Intake:  Pre-visit preparation completed: Yes  Pain : No/denies pain     BMI - recorded: 25.33 Nutritional Status: BMI 25 -29 Overweight Nutritional Risks: None Diabetes: No  How often do you need to have someone help you when you read instructions, pamphlets, or other written materials from your doctor or pharmacy?: 1 - Never  Diabetic?no  Interpreter Needed?: No  Information entered by :: Charlott Rakes, LPN   Activities of Daily Living    05/22/2022    1:15 PM  In your present state of health, do you have any difficulty performing the following activities:  Hearing? 1  Comment wears hearing aids at times  Vision? 0  Difficulty concentrating or making decisions? 0  Walking or climbing stairs? 0  Dressing or bathing? 0  Doing errands, shopping? 0  Preparing Food and eating ? N  Using the Toilet? N  In the past six months, have you accidently leaked urine? Y  Comment wears a pad when out  Do you have problems with loss of bowel control? N  Managing your Medications? N  Managing your Finances? N   Housekeeping or managing your Housekeeping? N    Patient Care Team: Mosie Lukes, MD as PCP - General (Family Medicine) Armbruster, Carlota Raspberry, MD as Consulting Physician (Gastroenterology) [name], RPH-CPP (Pharmacist) Tsamis, Constance Haw, MD as Referring Physician (Ophthalmology) Hollar, Katharine Look, MD as Referring Physician (Dermatology)  Indicate any recent Medical Services you may have received from other than [facility] providers in the past year (date may be approximate).     Assessment:   This is a routine wellness examination for [place].  Hearing/Vision screen Hearing Screening - Comments:: Pt wears hearing aids Vision Screening - Comments:: Pt follows up with Dr Margaretann Loveless for annual eye exams   Dietary issues and exercise activities discussed: Current Exercise Habits: The patient does not participate in regular exercise at present   Goals Addressed             This Visit's Progress    Patient Stated       Start exercise on the qbee        Depression Screen    05/22/2022    1:08 PM 04/19/2021    2:31 PM 12/25/2020   11:24 AM 06/26/2020   11:59 AM 01/04/2020   11:28 AM 12/04/2017   10:26 AM 07/25/2017   11:30 AM  PHQ 2/9 Scores  PHQ - 2 Score 0 0 0 0 0 0 0  PHQ- 9 Score    3       Fall Risk    05/22/2022    1:14 PM 04/25/2022   10:25 AM 12/24/2021   10:21 AM 04/19/2021    2:27 PM 12/25/2020   11:12 AM  [place] in the past year? 0 0 1 0 1  Number falls in past yr: 0 0 0 0 1  Injury with Fall? 0 0 0 0 1  Risk for fall due to : History of fall(s);Impaired balance/gait;Impaired vision  History of fall(s);Impaired balance/gait No Fall Risks History of fall(s)  Follow up Falls prevention discussed Falls evaluation completed Falls evaluation completed;Falls prevention discussed Falls prevention discussed Falls evaluation completed;Falls prevention discussed  FALL RISK PREVENTION PERTAINING TO THE HOME:  Any stairs in or around the home?  Yes  If so, are there any without handrails? Yes  Home free of loose throw rugs in walkways, pet beds, electrical cords, etc? Yes  Adequate lighting in your home to reduce risk of falls? Yes   ASSISTIVE DEVICES UTILIZED TO PREVENT FALLS:  Life alert? No  Use of a cane, walker or w/c? Yes  Grab bars in the bathroom? Yes  Shower chair or bench in shower? Yes  Elevated toilet seat or a handicapped toilet? No   TIMED UP AND GO:  Was the test performed? No .   Cognitive Function: pt declined see note below    07/25/2017   11:30 AM 05/09/2016   10:17 AM  MMSE - Mini Mental State Exam  Orientation to time 5 5  Orientation to Place 5 5  Registration 3 3  Attention/ Calculation 5 5  Recall 2 3  Language- name 2 objects 2 2  Language- repeat 1 1  Language- follow 3 step command 3 3  Language- read & follow direction 1 1  Write a sentence 1 1  Copy design 1 1  Total score 29 30        Immunizations Immunization History  Administered Date(s) Administered   Fluad Quad(high Dose 65+) 05/04/2019, 04/19/2021, 04/25/2022   Influenza, High Dose Seasonal PF 05/09/2016, 05/22/2017, 05/07/2018   Influenza,inj,Quad PF,6+ Mos 04/13/2013, 04/20/2014, 05/08/2015   Influenza-Unspecified 05/04/2020   PFIZER Comirnaty(Gray Top)Covid-19 Tri-Sucrose Vaccine 01/23/2021   PFIZER(Purple Top)SARS-COV-2 Vaccination 08/03/2019, 08/24/2019, 05/29/2020   Pfizer Covid-19 Vaccine Bivalent Booster 27yr & up 05/30/2021   Pneumococcal Conjugate-13 11/12/2013   Pneumococcal Polysaccharide-23 04/25/2011   Tdap 01/12/2010, 09/21/2020   Zoster Recombinat (Shingrix) 06/10/2017, 08/08/2017, 09/08/2017   Zoster, Live 07/16/2011    TDAP status: Up to date  Flu Vaccine status: Up to date  Pneumococcal vaccine status: Up to date  Covid-19 vaccine status: Completed vaccines  Qualifies for Shingles Vaccine? Yes   Zostavax completed Yes   Shingrix Completed?: Yes  Screening Tests Health Maintenance   Topic Date Due   COVID-19 Vaccine (6 - Pfizer risk series) 07/25/2021   Medicare Annual Wellness (AWV)  05/23/2023   TETANUS/TDAP  09/22/2030   Pneumonia Vaccine 81 Years old  Completed   INFLUENZA VACCINE  Completed   DEXA SCAN  Completed   Zoster Vaccines- Shingrix  Completed   HPV VACCINES  Aged Out    Health Maintenance  Health Maintenance Due  Topic Date Due   COVID-19 Vaccine (6 - Pfizer risk series) 07/25/2021    Colorectal cancer screening: Type of screening: Colonoscopy. Completed 9/1/. Repeat every as directed  years  Mammogram status: Completed 10/08/21. Repeat every year  Bone Density status: Completed 10/02/20. Results reflect: Bone density results: OSTEOPOROSIS. Repeat every 2 years.  Additional Screening:   Vision Screening: Recommended annual ophthalmology exams for early detection of glaucoma and other disorders of the eye. Is the patient up to date with their annual eye exam?  Yes  Who is the provider or what is the name of the office in which the patient attends annual eye exams? Dr VMargaretann Loveless If pt is not established with a provider, would they like to be referred to a provider to establish care? No .   Dental Screening: Recommended annual dental exams for proper oral hygiene  Community Resource Referral / Chronic Care Management: CRR required this visit?  No   CCM required this visit?  No      Plan:     I have personally reviewed and noted the following in the patient's chart:   Medical and social history Use of alcohol, tobacco or illicit drugs  Current medications and supplements including opioid prescriptions. Patient is not currently taking opioid prescriptions. Functional ability and status Nutritional status Physical activity Advanced directives List of other physicians Hospitalizations, surgeries, and ER visits in previous 12 months Vitals Screenings to include cognitive, depression, and falls Referrals and appointments  In addition,  I have reviewed and discussed with patient certain preventive protocols, quality metrics, and best practice recommendations. A written personalized care plan for preventive services as well as general preventive health recommendations were provided to patient.     Willette Brace, LPN   [id number]   Nurse Notes:  Pt has been declined cognition test however pt is very alert, oriented  and knowledgeable    I have reviewed and agree with [facility] documentation.  Kathlene November, MD

## 2022-06-12 ENCOUNTER — Other Ambulatory Visit (HOSPITAL_BASED_OUTPATIENT_CLINIC_OR_DEPARTMENT_OTHER): Payer: Self-pay

## 2022-06-12 MED ORDER — AREXVY 120 MCG/0.5ML IM SUSR
INTRAMUSCULAR | 0 refills | Status: DC
  Filled 2022-06-12: qty 1, 1d supply, fill #0

## 2022-06-25 ENCOUNTER — Ambulatory Visit (INDEPENDENT_AMBULATORY_CARE_PROVIDER_SITE_OTHER): Payer: Medicare Other | Admitting: Pharmacist

## 2022-06-25 DIAGNOSIS — E782 Mixed hyperlipidemia: Secondary | ICD-10-CM

## 2022-06-25 DIAGNOSIS — F419 Anxiety disorder, unspecified: Secondary | ICD-10-CM

## 2022-06-25 DIAGNOSIS — I10 Essential (primary) hypertension: Secondary | ICD-10-CM

## 2022-06-25 NOTE — Progress Notes (Signed)
Pharmacy Note  06/25/2022 Name: Terri Sandoval MRN: [id number] DOB: [birth year]  Subjective: Terri Sandoval is a 81 y.o. year old female who is a primary care patient of Mosie Lukes, MD. Clinical Pharmacist Practitioner referral was placed to assist with medication management.    Engaged with patient by telephone for follow up visit today.  Hypertension - Checks blood pressure about once a week. Checked yesterday and was 122/70. Taking ramipril '10mg'$  daily Anxiety- Taking fluoxetine daily. patient reports increased stress recently related to care for her handicapped son. She is concerned that his group home is not caring for him as they should. She is working with group home on improvements. She mentions that she has had incresae in loose stools which she is not sure is related to stress or because she has only been taking Citrucel every other day instead of daily due to stores being out of stock (there is a Event organiser. Anticipate availability to improve over the next 1 to 2 months per manufacturer) .  Hyperlipidemia - taking simvastatin '20mg'$  daily. LDL goal < 100. Last LDL was 78.   Objective: Review of patient status, including review of consultants reports, laboratory and other test data, was performed as part of comprehensive.  Lab Results  Component Value Date   CREATININE 0.85 04/25/2022   CREATININE 0.77 10/23/2021   CREATININE 0.84 04/19/2021    Lab Results  Component Value Date   HGBA1C 6.2 04/25/2022       Component Value Date/Time   CHOL 176 04/25/2022 1135   TRIG 103.0 04/25/2022 1135   HDL 76.70 04/25/2022 1135   CHOLHDL 2 04/25/2022 1135   VLDL 20.6 04/25/2022 1135   LDLCALC 78 04/25/2022 1135   LDLCALC 76 05/16/2020 0812     Clinical ASCVD: No  The ASCVD Risk score (Arnett DK, et al., 2019) failed to calculate for the following reasons:   The 2019 ASCVD risk score is only valid for ages 4 to 48    BP Readings from Last 3 Encounters:  05/22/22 123/62   04/25/22 122/64  10/26/21 (!) 114/58     Allergies  Allergen Reactions   Sulfamethoxazole-Trimethoprim Other (See Comments)    Very sore, sore in corner of lip    Medications Reviewed Today     Reviewed by [name], RPH-CPP (Pharmacist) on 06/25/22 at 1034  Med List Status: <None>   Medication Order Taking? Sig Documenting Provider Last Dose Status Informant  Calcium Carbonate Antacid (TUMS PO) [id number] Yes Take 1 tablet by mouth as needed. [provider] Taking Active   cetirizine (ZYRTEC) 10 MG tablet [id number] Yes Take 10 mg by mouth daily as needed for allergies. [provider] Taking Active   Cholecalciferol (VITAMIN D3) 50 MCG (2000 UT) capsule [id number] Yes Take 1 capsule by mouth daily. [provider] Taking Active   FLUoxetine (PROZAC) 20 MG capsule [id number] Yes TAKE 1 CAPSULE(20 MG) BY MOUTH DAILY Mosie Lukes, MD Taking Active   glucosamine-chondroitin 500-400 MG tablet [id number] Yes Take 1 tablet by mouth daily. [provider] Taking Active   methylcellulose (CITRUCEL) oral powder [id number] Yes Use as directed Daily  Patient taking differently: every other day. Use as directed Daily if needed   Armbruster, Carlota Raspberry, MD Taking Active   Multiple Vitamin (MULTIVITAMIN) tablet [id number] Yes Take 1 tablet by mouth daily. Vitamin pack from [facility] [provider] Taking Active   mupirocin ointment (BACTROBAN) 2 % [id number] Yes 1 application 3 (three) times  daily. If needed [provider] Taking Active   OMEGA-3 FATTY ACIDS PO [id number] Yes Take 1,200 mg by mouth daily. [provider] Taking Active   Pumpkin Seed-Soy Germ (AZO BLADDER CONTROL/GO-LESS PO) [id number] Yes Take 1 tablet by mouth in the morning and at bedtime. [provider] Taking Active   ramipril (ALTACE) 10 MG capsule [id number] Yes TAKE 1 CAPSULE(10 MG) BY MOUTH DAILY Mosie Lukes, MD Taking Active   simvastatin (ZOCOR) 20 MG tablet  [id number] Yes TAKE 1 TABLET(20 MG) BY MOUTH DAILY AT 6 PM Mosie Lukes, MD Taking Active   Skin Protectants, Misc. ([place]) [id number] Yes Apply topically at bedtime. [provider] Taking Active             Patient Active Problem List   Diagnosis Date Noted   Pedal edema 04/25/2022   Posterior vitreous detachment of both eyes 02/27/2022   Chronic left sacroiliac pain 11/02/2020   Abnormal TSH 11/02/2020   Lip lesion 05/04/2019   Allergy 11/15/2018   Fall 12/04/2017   Hyperglycemia 12/04/2017   Abnormal results of thyroid function studies 05/26/2017   Vitamin D deficiency 11/07/2015   Preventative health care 11/07/2015   Verruca 11/07/2015   BPV (benign positional vertigo) 05/08/2015   Skin cancer 10/31/2014   Atrophic vaginitis 11/08/2013   Cerumen impaction 06/11/2013   Medicare annual wellness visit, subsequent 05/16/2013   Osteoporosis 03/09/2013   GERD (gastroesophageal reflux disease)    Hypertension    Anxiety    Hyperlipidemia    Arthritis    Chicken pox    Diverticulosis    Colon polyps    Urine incontinence      Medication Assistance:  None required.  Patient affirms current coverage meets needs.   Assessment / Plan: Hypertension - Controlled Continue ramipril '10mg'$  daily Continue to check blood pressure at home once per week - goal < 130/80 Anxiety  Continue fluoxetine daily.  Diarrhea - could be related to stress or change in Citrucel Recommended trial of Citrucel / methylcellulose tablets instead of powder since she if having difficulty finding powders.  Reminder her she can also use Imodium as needed for loose stools per GI - Dr Enis Gash.  Hyperlipidemia - tat goal LDL of < 100 Continue simvastatin '20mg'$  daily.   Medication management:  Reviewed and updated medication list Reviewed refill history and adherence  Patient had questions about [employer] 2024 Medicare Part D changes. Reviewed information and  formulary. It looks like simvastatin, fluoxetine and ramipril will be tier 1 which could be up to $10 / 30 days or $30/90 days at local pharmacy. She could get 90 days supply for $15 if she uses [facility] mail order pharmacy.    Follow Up:  Telephone follow up appointment with care management team member scheduled for:  1 year.   [name], PharmD Clinical Pharmacist [place] [phone]

## 2022-06-25 NOTE — Patient Instructions (Addendum)
Terri Sandoval It was a pleasure speaking with you today.  Below is a summary of your health goals and summary of our recent visit.    Hypertension - Controlled Continue ramipril '10mg'$  daily Continue to check blood pressure at home once per week - goal < 130/80  Anxiety  Continue fluoxetine daily.   Diarrhea - could be related to stress or change in Citrucel Recommend trial of Citrucel / methylcellulose tablets instead of powder  You can also use Imodium as needed for loose stools per GI - Dr Enis Gash.   Hyperlipidemia - at goal LDL of < 100 Continue simvastatin '20mg'$  daily.   As always if you have any questions or concerns especially regarding medications, please feel free to contact me either at the phone number below or with a MyChart message.   Keep up the good work!  [name], PharmD Clinical Pharmacist [facility] [phone] (direct line)  [phone] (main office number)

## 2022-08-27 ENCOUNTER — Other Ambulatory Visit (HOSPITAL_BASED_OUTPATIENT_CLINIC_OR_DEPARTMENT_OTHER): Payer: Self-pay | Admitting: Family Medicine

## 2022-08-27 DIAGNOSIS — Z1231 Encounter for screening mammogram for malignant neoplasm of breast: Secondary | ICD-10-CM

## 2022-08-30 DIAGNOSIS — H40003 Preglaucoma, unspecified, bilateral: Secondary | ICD-10-CM | POA: Diagnosis not present

## 2022-09-02 ENCOUNTER — Encounter: Payer: Self-pay | Admitting: Podiatry

## 2022-09-02 ENCOUNTER — Ambulatory Visit (INDEPENDENT_AMBULATORY_CARE_PROVIDER_SITE_OTHER): Payer: Medicare Other | Admitting: Podiatry

## 2022-09-02 VITALS — BP 129/54

## 2022-09-02 DIAGNOSIS — M79674 Pain in right toe(s): Secondary | ICD-10-CM | POA: Diagnosis not present

## 2022-09-02 DIAGNOSIS — M79675 Pain in left toe(s): Secondary | ICD-10-CM | POA: Diagnosis not present

## 2022-09-02 DIAGNOSIS — B351 Tinea unguium: Secondary | ICD-10-CM

## 2022-09-02 NOTE — Progress Notes (Signed)
  Subjective:  Patient ID: Terri Sandoval, female    DOB: [birth year],  MRN: [id number]  Terri KIRSCHENMAN presents to clinic today for painful thick toenails that are difficult to trim. Pain interferes with ambulation. Aggravating factors include wearing enclosed shoe gear. Pain is relieved with periodic professional debridement.  Chief Complaint  Patient presents with   Nail Problem    [facility] PCP-[facility] PCP VST-04/2022   New problem(s): None.   PCP is Mosie Lukes, MD.  Allergies  Allergen Reactions   Sulfamethoxazole-Trimethoprim Other (See Comments)    Very sore, sore in corner of lip    Review of Systems: Negative except as noted in the HPI.  Objective: No changes noted in today's physical examination. Vitals:   09/02/22 1330  BP: (!) 129/54   Terri Sandoval is a pleasant 82 y.o. female WD, WN in NAD. AAO x 3.  Vascular Examination:  CFT <3 seconds b/l LE. Palpable DP pulse(s) b/l LE. Palpable PT pulse(s) b/l LE. Pedal hair absent. No pain with calf compression b/l. Lower extremity skin temperature gradient within normal limits. No edema noted b/l LE. Varicosities present b/l. No ischemia or gangrene noted b/l LE. No cyanosis or clubbing noted b/l LE.  Dermatological Examination: Pedal skin thin, shiny and atrophic b/l LE. No open wounds b/l LE. No interdigital macerations noted b/l LE. Toenails 1-5 b/l elongated, discolored, dystrophic, thickened, crumbly with subungual debris and tenderness to dorsal palpation.  Musculoskeletal: Muscle strength 5/5 to all lower extremity muscle groups bilaterally. No pain, crepitus or joint limitation noted with ROM bilateral LE. No gross bony deformities bilaterally. Patient ambulates independent of any assistive aids.  Neurological: Protective sensation intact 5/5 intact bilaterally with 10g monofilament b/l. Vibratory sensation intact b/l. Deep tendon reflexes normal b/l.   Assessment/Plan: 1. Pain due to onychomycosis of toenails of both  feet     No orders of the defined types were placed in this encounter.  -Patient was evaluated and treated. All patient's and/or POA's questions/concerns answered on today's visit. -Patient to continue soft, supportive shoe gear daily. -Mycotic toenails 1-5 bilaterally were debrided in length and girth with sterile nail nippers and dremel without incident. -Patient/POA to call should there be question/concern in the interim.   Return in about 3 months (around 12/01/2022).  Marzetta Board, DPM

## 2022-09-09 ENCOUNTER — Ambulatory Visit: Payer: Medicare Other | Admitting: Podiatry

## 2022-09-09 ENCOUNTER — Telehealth: Payer: Self-pay | Admitting: Gastroenterology

## 2022-09-09 MED ORDER — METAMUCIL 4 IN 1 FIBER 55.6 % PO POWD: 1.0000 | ORAL | 0 refills | Status: DC

## 2022-09-09 NOTE — Telephone Encounter (Signed)
Since last seen her diarrhea has worsened. She was told to take Citrucel but it is no longer sold in stores. She is looking for other options for her. Please advise.

## 2022-09-09 NOTE — Telephone Encounter (Signed)
Returned call to patient. She states that the diarrhea returned after eating onions and cucumbers. She has also not been using the otrucel powder since she has not been able to find it OTC. Pt states that she tried Citrucel gummies, but they are not as effective. Pt has been advised to try Metamucil powder instead. Pt will call back to set up a f/u appt if she continues to have diarrhea despite medication change. Pt verbalized understanding and had no concerns at the end of the call.

## 2022-10-14 ENCOUNTER — Ambulatory Visit (HOSPITAL_BASED_OUTPATIENT_CLINIC_OR_DEPARTMENT_OTHER)
Admission: RE | Admit: 2022-10-14 | Discharge: 2022-10-14 | Disposition: A | Discharge status: Home / Self Care | Payer: Medicare Other | Source: Ambulatory Visit | Attending: Family Medicine | Admitting: Family Medicine

## 2022-10-14 ENCOUNTER — Encounter (HOSPITAL_BASED_OUTPATIENT_CLINIC_OR_DEPARTMENT_OTHER): Payer: Self-pay

## 2022-10-14 DIAGNOSIS — Z1231 Encounter for screening mammogram for malignant neoplasm of breast: Secondary | ICD-10-CM | POA: Diagnosis not present

## 2022-10-16 DIAGNOSIS — M9903 Segmental and somatic dysfunction of lumbar region: Secondary | ICD-10-CM | POA: Diagnosis not present

## 2022-10-16 DIAGNOSIS — M9902 Segmental and somatic dysfunction of thoracic region: Secondary | ICD-10-CM | POA: Diagnosis not present

## 2022-10-16 DIAGNOSIS — M25561 Pain in right knee: Secondary | ICD-10-CM | POA: Diagnosis not present

## 2022-10-16 DIAGNOSIS — M5417 Radiculopathy, lumbosacral region: Secondary | ICD-10-CM | POA: Diagnosis not present

## 2022-10-16 DIAGNOSIS — M25552 Pain in left hip: Secondary | ICD-10-CM | POA: Diagnosis not present

## 2022-10-16 DIAGNOSIS — M62830 Muscle spasm of back: Secondary | ICD-10-CM | POA: Diagnosis not present

## 2022-10-23 DIAGNOSIS — M25552 Pain in left hip: Secondary | ICD-10-CM | POA: Diagnosis not present

## 2022-10-23 DIAGNOSIS — M9902 Segmental and somatic dysfunction of thoracic region: Secondary | ICD-10-CM | POA: Diagnosis not present

## 2022-10-23 DIAGNOSIS — M9903 Segmental and somatic dysfunction of lumbar region: Secondary | ICD-10-CM | POA: Diagnosis not present

## 2022-10-23 DIAGNOSIS — M5417 Radiculopathy, lumbosacral region: Secondary | ICD-10-CM | POA: Diagnosis not present

## 2022-10-23 DIAGNOSIS — M25561 Pain in right knee: Secondary | ICD-10-CM | POA: Diagnosis not present

## 2022-10-23 DIAGNOSIS — M62830 Muscle spasm of back: Secondary | ICD-10-CM | POA: Diagnosis not present

## 2022-10-24 ENCOUNTER — Ambulatory Visit: Payer: Medicare Other | Admitting: Family Medicine

## 2022-10-25 ENCOUNTER — Ambulatory Visit: Payer: Medicare Other | Admitting: Family

## 2022-11-06 DIAGNOSIS — M5417 Radiculopathy, lumbosacral region: Secondary | ICD-10-CM | POA: Diagnosis not present

## 2022-11-06 DIAGNOSIS — M25561 Pain in right knee: Secondary | ICD-10-CM | POA: Diagnosis not present

## 2022-11-06 DIAGNOSIS — M9903 Segmental and somatic dysfunction of lumbar region: Secondary | ICD-10-CM | POA: Diagnosis not present

## 2022-11-06 DIAGNOSIS — M25552 Pain in left hip: Secondary | ICD-10-CM | POA: Diagnosis not present

## 2022-11-06 DIAGNOSIS — M9902 Segmental and somatic dysfunction of thoracic region: Secondary | ICD-10-CM | POA: Diagnosis not present

## 2022-11-06 DIAGNOSIS — M62830 Muscle spasm of back: Secondary | ICD-10-CM | POA: Diagnosis not present

## 2022-11-13 ENCOUNTER — Other Ambulatory Visit: Payer: Self-pay | Admitting: Family Medicine

## 2022-11-27 DIAGNOSIS — M25552 Pain in left hip: Secondary | ICD-10-CM | POA: Diagnosis not present

## 2022-11-27 DIAGNOSIS — M9903 Segmental and somatic dysfunction of lumbar region: Secondary | ICD-10-CM | POA: Diagnosis not present

## 2022-11-27 DIAGNOSIS — M25561 Pain in right knee: Secondary | ICD-10-CM | POA: Diagnosis not present

## 2022-11-27 DIAGNOSIS — M5417 Radiculopathy, lumbosacral region: Secondary | ICD-10-CM | POA: Diagnosis not present

## 2022-11-27 DIAGNOSIS — M62830 Muscle spasm of back: Secondary | ICD-10-CM | POA: Diagnosis not present

## 2022-11-27 DIAGNOSIS — M9902 Segmental and somatic dysfunction of thoracic region: Secondary | ICD-10-CM | POA: Diagnosis not present

## 2022-12-11 DIAGNOSIS — M25561 Pain in right knee: Secondary | ICD-10-CM | POA: Diagnosis not present

## 2022-12-11 DIAGNOSIS — M25552 Pain in left hip: Secondary | ICD-10-CM | POA: Diagnosis not present

## 2022-12-11 DIAGNOSIS — M9902 Segmental and somatic dysfunction of thoracic region: Secondary | ICD-10-CM | POA: Diagnosis not present

## 2022-12-11 DIAGNOSIS — M62830 Muscle spasm of back: Secondary | ICD-10-CM | POA: Diagnosis not present

## 2022-12-11 DIAGNOSIS — M5417 Radiculopathy, lumbosacral region: Secondary | ICD-10-CM | POA: Diagnosis not present

## 2022-12-11 DIAGNOSIS — M9903 Segmental and somatic dysfunction of lumbar region: Secondary | ICD-10-CM | POA: Diagnosis not present

## 2022-12-18 ENCOUNTER — Ambulatory Visit (INDEPENDENT_AMBULATORY_CARE_PROVIDER_SITE_OTHER): Payer: Medicare Other | Admitting: Podiatry

## 2022-12-18 ENCOUNTER — Encounter: Payer: Self-pay | Admitting: Podiatry

## 2022-12-18 DIAGNOSIS — M79675 Pain in left toe(s): Secondary | ICD-10-CM

## 2022-12-18 DIAGNOSIS — M79674 Pain in right toe(s): Secondary | ICD-10-CM

## 2022-12-18 DIAGNOSIS — B351 Tinea unguium: Secondary | ICD-10-CM | POA: Diagnosis not present

## 2022-12-18 NOTE — Progress Notes (Signed)
  Subjective:  Patient ID: Terri Sandoval, female    DOB: 10/04/1940,  MRN: [id number]  Terri Sandoval presents to clinic today for painful elongated mycotic toenails 1-5 bilaterally which are tender when wearing enclosed shoe gear. Pain is relieved with periodic professional debridement.  Chief Complaint  Patient presents with   Nail Problem    RFC,Referring Provider Bradd Canary, MD,LOV:12/23      New problem(s): None.   PCP is Bradd Canary, MD.  Allergies  Allergen Reactions   Sulfamethoxazole-Trimethoprim Other (See Comments)    Very sore, sore in corner of lip    Review of Systems: Negative except as noted in the HPI.  Objective: No changes noted in today's physical examination. There were no vitals filed for this visit. Terri Sandoval is a pleasant 82 y.o. female WD, WN in NAD. AAO x 3.  Vascular Examination: Capillary refill time immediate b/l. Vascular status intact b/l with palpable pedal pulses. Pedal hair present b/l. No pain with calf compression b/l. Skin temperature gradient WNL b/l. No cyanosis or clubbing b/l. No ischemia or gangrene noted b/l. Varicosities present b/l.  Neurological Examination: Sensation grossly intact b/l with 10 gram monofilament. Vibratory sensation intact b/l.   Dermatological Examination: Pedal skin with normal turgor, texture and tone b/l.  No open wounds. No interdigital macerations.   Toenails 1-5 b/l thick, discolored, elongated with subungual debris and pain on dorsal palpation.   No hyperkeratotic nor porokeratotic lesions present on today's visit.  Musculoskeletal Examination: Normal muscle strength 5/5 to all lower extremity muscle groups bilaterally. No pain, crepitus or joint limitation noted with ROM b/l LE. No gross bony pedal deformities b/l. Patient ambulates independently without assistive aids.  Radiographs: None  Last A1c:      Latest Ref Rng & Units 04/25/2022   11:35 AM  Hemoglobin A1C  Hemoglobin-A1c 4.6  - 6.5 % 6.2    Assessment/Plan: 1. Pain due to onychomycosis of toenails of both feet     -Consent given for treatment as described below: -Examined patient. -Continue supportive shoe gear daily. -Toenails 1-5 b/l were debrided in length and girth with sterile nail nippers and dremel without iatrogenic bleeding.  -Patient/POA to call should there be question/concern in the interim.   Return in about 3 months (around 03/20/2023).  Terri Sandoval, DPM

## 2022-12-25 DIAGNOSIS — M25561 Pain in right knee: Secondary | ICD-10-CM | POA: Diagnosis not present

## 2022-12-25 DIAGNOSIS — M9902 Segmental and somatic dysfunction of thoracic region: Secondary | ICD-10-CM | POA: Diagnosis not present

## 2022-12-25 DIAGNOSIS — M5417 Radiculopathy, lumbosacral region: Secondary | ICD-10-CM | POA: Diagnosis not present

## 2022-12-25 DIAGNOSIS — M9903 Segmental and somatic dysfunction of lumbar region: Secondary | ICD-10-CM | POA: Diagnosis not present

## 2022-12-25 DIAGNOSIS — M62830 Muscle spasm of back: Secondary | ICD-10-CM | POA: Diagnosis not present

## 2022-12-25 DIAGNOSIS — M25552 Pain in left hip: Secondary | ICD-10-CM | POA: Diagnosis not present

## 2022-12-25 NOTE — Assessment & Plan Note (Signed)
Encourage heart healthy diet such as MIND or DASH diet, increase exercise, avoid trans fats, simple carbohydrates and processed foods, consider a krill or fish or flaxseed oil cap daily.  °

## 2022-12-25 NOTE — Assessment & Plan Note (Signed)
Supplement and monitor 

## 2022-12-25 NOTE — Assessment & Plan Note (Signed)
hgba1c acceptable, minimize simple carbs. Increase exercise as tolerated. Given high dose flu shot today 

## 2022-12-25 NOTE — Assessment & Plan Note (Signed)
Well controlled, no changes to meds. Encouraged heart healthy diet such as the DASH diet and exercise as tolerated.  °

## 2022-12-26 ENCOUNTER — Encounter: Payer: Self-pay | Admitting: Family Medicine

## 2022-12-26 ENCOUNTER — Ambulatory Visit (INDEPENDENT_AMBULATORY_CARE_PROVIDER_SITE_OTHER): Payer: Medicare Other | Admitting: Family Medicine

## 2022-12-26 VITALS — BP 139/51 | HR 61 | Ht 63.0 in | Wt 147.6 lb

## 2022-12-26 DIAGNOSIS — R739 Hyperglycemia, unspecified: Secondary | ICD-10-CM | POA: Diagnosis not present

## 2022-12-26 DIAGNOSIS — M25561 Pain in right knee: Secondary | ICD-10-CM

## 2022-12-26 DIAGNOSIS — E559 Vitamin D deficiency, unspecified: Secondary | ICD-10-CM | POA: Diagnosis not present

## 2022-12-26 DIAGNOSIS — I10 Essential (primary) hypertension: Secondary | ICD-10-CM

## 2022-12-26 DIAGNOSIS — E782 Mixed hyperlipidemia: Secondary | ICD-10-CM

## 2022-12-26 NOTE — Assessment & Plan Note (Signed)
Since the spring but has improved with chiropractic care. She did have a recent fall that also flared her hip but it has improved some now.

## 2022-12-26 NOTE — Progress Notes (Signed)
Subjective:   By signing my name below, I, Terri Sandoval, attest that this documentation has been prepared under the direction and in the presence of Terri Canary, MD., 12/26/2022.   Patient ID: Terri Sandoval, female    DOB: 17-Aug-1940, 82 y.o.   MRN: [id number]  Chief Complaint  Patient presents with   Follow-up   HPI Patient is in today for an office visit and is accompanied by her husband. She denies recent hospitalization, febrile illness, CP/palpitations/SOB/HA/fever/chills/GI or GU symptoms.  Chronic Diarrhea Patient reports that she is taking Citrucel every other day to manage chronic diarrhea. This has been helpful and she denies nausea, vomiting, constipation, abdominal pain, or blood in stool.  Glaucoma Patient has been following with ophthalmologist Dr. Hubbard Hartshorn at [facility] to manage glaucoma. Today she denies blurred vision, double vision, eye pain or discharge.  Right Knee Pain Patient reports that she fell several months ago while undressing, causing right knee pain. The pain is moderate and she is able to ambulate without an assisted walking device. She has been seeing chiropractor Dr. Ronney Asters since 10/2022 to manage the pain and states this has been helpful.   Past Medical History:  Diagnosis Date   Adrenal insufficiency (HCC)    Anxiety    Arthritis    Atrophic vaginitis 11/08/2013   BPV (benign positional vertigo) 05/08/2015   Cataract    Chicken pox as a child   Colon polyps    removed during colonscopy   Diverticulitis    Diverticulosis    GERD (gastroesophageal reflux disease)    Hyperlipidemia    Hypertension    Medicare annual wellness visit, subsequent 05/16/2013   Osteoporosis, unspecified 03/09/2013   Preventative health care 11/07/2015   Skin cancer    behind ear   Urine incontinence    sometimes   Verruca 11/07/2015   Vitamin D deficiency 11/07/2015    Past Surgical History:  Procedure Laterality Date    ABDOMINAL HYSTERECTOMY  97   has ovaries   BREAST BIOPSY Left     FNA   CATARACT EXTRACTION  L 09/11/10, R 10/10/10   DILATION AND CURETTAGE OF UTERUS  1990, 1993, 1997   EYE SURGERY Bilateral 10/10/2010, 07/28/17   cataracts, posterior capsulotomy b/l   TONSILLECTOMY AND ADENOIDECTOMY  1990    Family History  Problem Relation Age of Onset   Arthritis Mother    Hyperlipidemia Mother    Heart disease Mother    Hypertension Mother    Colon cancer Father    Diabetes Sister        type 2   Heart disease Sister    Hypertension Sister    Hyperlipidemia Sister    Mental illness Maternal Uncle    Stroke Maternal Grandmother    Pneumonia Maternal Grandfather    Cerebral palsy Son        MR   Colon cancer Cousin     Social History   Socioeconomic History   Marital status: Married    Spouse name: Not on file   Number of children: 2   Years of education: Not on file   Highest education level: 12th grade  Occupational History   Occupation: retired  Tobacco Use   Smoking status: Never   Smokeless tobacco: Never  Vaping Use   Vaping Use: Never used  Substance and Sexual Activity   Alcohol use: No   Drug use: No   Sexual activity: Never  Comment: lives with husband and disabled son, no dietary restrictions, not exercising regularly  Other Topics Concern   Not on file  Social History Narrative   Not on file   Social Determinants of Health   Financial Resource Strain: Low Risk  (12/19/2022)   Overall Financial Resource Strain (CARDIA)    Difficulty of Paying Living Expenses: Not hard at all  Food Insecurity: No Food Insecurity (12/19/2022)   Hunger Vital Sign    Worried About Running Out of Food in the Last Year: Never true    Ran Out of Food in the Last Year: Never true  Transportation Needs: No Transportation Needs (12/19/2022)   PRAPARE - Administrator, Civil Service (Medical): No    Lack of Transportation (Non-Medical): No  Physical Activity: Inactive  (12/19/2022)   Exercise Vital Sign    Days of Exercise per Week: 0 days    Minutes of Exercise per Session: 0 min  Stress: No Stress Concern Present (05/22/2022)   [employer] of Occupational Health - Occupational Stress Questionnaire    Feeling of Stress : Not at all  Social Connections: Socially Integrated (12/19/2022)   Social Connection and Isolation Panel [NHANES]    Frequency of Communication with Friends and Family: More than three times a week    Frequency of Social Gatherings with Friends and Family: More than three times a week    Attends Religious Services: More than 4 times per year    Active Member of [employer] or Organizations: Yes    Attends Engineer, structural: More than 4 times per year    Marital Status: Married  Catering manager Violence: Not At Risk (05/22/2022)   Humiliation, Afraid, Rape, and Kick questionnaire    Fear of Current or Ex-Partner: No    Emotionally Abused: No    Physically Abused: No    Sexually Abused: No    Outpatient Medications Prior to Visit  Medication Sig Dispense Refill   Calcium Carbonate Antacid (TUMS PO) Take 1 tablet by mouth as needed.     cetirizine (ZYRTEC) 10 MG tablet Take 10 mg by mouth daily as needed for allergies.     Cholecalciferol (VITAMIN D3) 50 MCG (2000 UT) capsule Take 1 capsule by mouth daily.     FLUoxetine (PROZAC) 20 MG capsule TAKE 1 CAPSULE(20 MG) BY MOUTH DAILY 90 capsule 1   glucosamine-chondroitin 500-400 MG tablet Take 1 tablet by mouth daily.     Multiple Vitamin (MULTIVITAMIN) tablet Take 1 tablet by mouth daily. Vitamin pack from [employer]     mupirocin ointment (BACTROBAN) 2 % 1 application 3 (three) times daily. If needed     OMEGA-3 FATTY ACIDS PO Take 1,200 mg by mouth daily.     Pumpkin Seed-Soy Germ (AZO BLADDER CONTROL/GO-LESS PO) Take 1 tablet by mouth in the morning and at bedtime.     ramipril (ALTACE) 10 MG capsule TAKE 1 CAPSULE(10 MG) BY MOUTH DAILY 90 capsule 1   simvastatin (ZOCOR) 20 MG  tablet TAKE 1 TABLET(20 MG) BY MOUTH DAILY AT 6 PM 90 tablet 1   Skin Protectants, Misc. (AQUAPHOR LIP REPAIR EX) Apply topically at bedtime.     Psyllium (METAMUCIL 4 IN 1 FIBER) 55.6 % POWD Take 1 Dose by mouth as directed. 284 g 0   No facility-administered medications prior to visit.    Allergies  Allergen Reactions   Sulfamethoxazole-Trimethoprim Other (See Comments)    Very sore, sore in corner of lip  Review of Systems  Constitutional:  Negative for chills and fever.  Eyes:  Negative for blurred vision, double vision, pain and discharge.  Respiratory:  Negative for shortness of breath.   Cardiovascular:  Negative for chest pain and palpitations.  Gastrointestinal:  Positive for diarrhea. Negative for abdominal pain, blood in stool, constipation, nausea and vomiting.  Genitourinary:  Negative for dysuria, frequency, hematuria and urgency.  Musculoskeletal:  Positive for joint pain (right knee pain).  Skin:           Neurological:  Negative for headaches.       Objective:    Physical Exam Constitutional:      General: She is not in acute distress.    Appearance: Normal appearance. She is not ill-appearing.  HENT:     Head: Normocephalic and atraumatic.     Right Ear: Hearing, tympanic membrane, ear canal and external ear normal.     Left Ear: Hearing, tympanic membrane, ear canal and external ear normal.     Ears:     Comments: There is slight wax buildup in the right ear canal.    Nose: Nose normal.     Mouth/Throat:     Mouth: Mucous membranes are moist.     Pharynx: Oropharynx is clear.  Eyes:     General:        Right eye: No discharge.        Left eye: No discharge.     Extraocular Movements: Extraocular movements intact.     Conjunctiva/sclera: Conjunctivae normal.     Pupils: Pupils are equal, round, and reactive to light.  Cardiovascular:     Rate and Rhythm: Normal rate and regular rhythm.     Pulses: Normal pulses.     Heart sounds: Normal heart  sounds. No murmur heard.    No gallop.  Pulmonary:     Effort: Pulmonary effort is normal. No respiratory distress.     Breath sounds: Normal breath sounds. No wheezing or rales.  Abdominal:     General: Bowel sounds are normal.     Palpations: Abdomen is soft.     Tenderness: There is no abdominal tenderness. There is no guarding.  Musculoskeletal:        General: Normal range of motion.     Cervical back: Normal range of motion.     Right lower leg: No edema.     Left lower leg: No edema.  Skin:    General: Skin is warm and dry.     Comments: There are multiple benign cherry angiomas and seborrhea keratoses across the abdomen and back.  Neurological:     Mental Status: She is alert and oriented to person, place, and time.  Psychiatric:        Mood and Affect: Mood normal.        Behavior: Behavior normal.        Judgment: Judgment normal.     BP (!) 139/51 (BP Location: Left Arm, Patient Position: Sitting, Cuff Size: Normal)   Pulse 61   Ht 5\' 3"  (1.6 m)   Wt 147 lb 9.6 oz (67 kg)   SpO2 100%   BMI 26.15 kg/m  Wt Readings from Last 3 Encounters:  12/26/22 147 lb 9.6 oz (67 kg)  05/22/22 143 lb (64.9 kg)  04/25/22 143 lb 12.8 oz (65.2 kg)    Diabetic Foot Exam - Simple   No data filed    Lab Results  Component Value Date   WBC 5.9  04/25/2022   HGB 13.7 04/25/2022   HCT 41.3 04/25/2022   PLT 226.0 04/25/2022   GLUCOSE 83 04/25/2022   CHOL 176 04/25/2022   TRIG 103.0 04/25/2022   HDL 76.70 04/25/2022   LDLCALC 78 04/25/2022   ALT 19 04/25/2022   AST 25 04/25/2022   NA 136 04/25/2022   K 4.2 04/25/2022   CL 98 04/25/2022   CREATININE 0.85 04/25/2022   BUN 15 04/25/2022   CO2 32 04/25/2022   TSH 4.06 04/25/2022   HGBA1C 6.2 04/25/2022   MICROALBUR <0.7 05/04/2018    Lab Results  Component Value Date   TSH 4.06 04/25/2022   Lab Results  Component Value Date   WBC 5.9 04/25/2022   HGB 13.7 04/25/2022   HCT 41.3 04/25/2022   MCV 90.4 04/25/2022    PLT 226.0 04/25/2022   Lab Results  Component Value Date   NA 136 04/25/2022   K 4.2 04/25/2022   CO2 32 04/25/2022   GLUCOSE 83 04/25/2022   BUN 15 04/25/2022   CREATININE 0.85 04/25/2022   BILITOT 0.5 04/25/2022   ALKPHOS 65 04/25/2022   AST 25 04/25/2022   ALT 19 04/25/2022   PROT 7.7 04/25/2022   ALBUMIN 4.6 04/25/2022   CALCIUM 10.1 04/25/2022   GFR 64.54 04/25/2022   Lab Results  Component Value Date   CHOL 176 04/25/2022   Lab Results  Component Value Date   HDL 76.70 04/25/2022   Lab Results  Component Value Date   LDLCALC 78 04/25/2022   Lab Results  Component Value Date   TRIG 103.0 04/25/2022   Lab Results  Component Value Date   CHOLHDL 2 04/25/2022   Lab Results  Component Value Date   HGBA1C 6.2 04/25/2022      Assessment & Plan:  Chronic Diarrhea: This is controlled with Citrucel every other day.  Glaucoma: This is managed by Dr. Hubbard [facility]  Hyperglycemia: This is monitored. Encouraged 6-8 hours of sleep, heart healthy diet, 60-80 oz of non-alcohol/non-caffeinated fluids, and 4000-8000 steps daily.  Hyperlipidemia: This is monitored and controlled with Simvastatin 20 mg daily. There are no medication adjustments today.  Hypertension: This is monitored and controlled with Ramipril 10 mg daily. There are no medication adjustments today.   Immunizations: Encouraged patient to consider annual COVID-19 and Influenza vaccinations.  Labs: Routine blood work ordered.  Right Knee Pain: This is being treated by chiropractor Dr. Ronney Asters.  Vitamin D Deficiency: This is monitored and controlled with vitamin D 2000 IU daily. Problem List Items Addressed This Visit     Hyperglycemia    hgba1c acceptable, minimize simple carbs. Increase exercise as tolerated. Given high dose flu shot today      Relevant Orders   Hemoglobin A1c   Hyperlipidemia    Encourage heart healthy diet such as MIND or DASH  diet, increase exercise, avoid trans fats, simple carbohydrates and processed foods, consider a krill or fish or flaxseed oil cap daily.       Relevant Orders   Lipid panel   Hypertension - Primary    Well controlled, no changes to meds. Encouraged heart healthy diet such as the DASH diet and exercise as tolerated.       Relevant Orders   Comprehensive metabolic panel   CBC with Differential/Platelet   TSH   Knee pain, right    Since the spring but has improved with chiropractic care. She did have a recent fall that also  flared her hip but it has improved some now.       Vitamin D deficiency    Supplement and monitor      Relevant Orders   VITAMIN D 25 Hydroxy (Vit-D Deficiency, Fractures)   No orders of the defined types were placed in this encounter.  I, Danise Edge, MD, personally preformed the services described in this documentation.  All medical record entries made by the scribe were at my direction and in my presence.  I have reviewed the chart and discharge instructions (if applicable) and agree that the record reflects my personal performance and is accurate and complete. 12/26/2022  I,Mohammed Iqbal,acting as a scribe for Danise Edge, MD.,have documented all relevant documentation on the behalf of Danise Edge, MD,as directed by  Danise Edge, MD while in the presence of Danise Edge, MD.  Danise Edge, MD

## 2022-12-26 NOTE — Patient Instructions (Addendum)
4000, 8000 stepsHypertension, Adult High blood pressure (hypertension) is when the force of blood pumping through the arteries is too strong. The arteries are the blood vessels that carry blood from the heart throughout the body. Hypertension forces the heart to work harder to pump blood and may cause arteries to become narrow or stiff. Untreated or uncontrolled hypertension can lead to a heart attack, heart failure, a stroke, kidney disease, and other problems. A blood pressure reading consists of a higher number over a lower number. Ideally, your blood pressure should be below 120/80. The first ("top") number is called the systolic pressure. It is a measure of the pressure in your arteries as your heart beats. The second ("bottom") number is called the diastolic pressure. It is a measure of the pressure in your arteries as the heart relaxes. What are the causes? The exact cause of this condition is not known. There are some conditions that result in high blood pressure. What increases the risk? Certain factors may make you more likely to develop high blood pressure. Some of these risk factors are under your control, including: Smoking. Not getting enough exercise or physical activity. Being overweight. Having too much fat, sugar, calories, or salt (sodium) in your diet. Drinking too much alcohol. Other risk factors include: Having a personal history of heart disease, diabetes, high cholesterol, or kidney disease. Stress. Having a family history of high blood pressure and high cholesterol. Having obstructive sleep apnea. Age. The risk increases with age. What are the signs or symptoms? High blood pressure may not cause symptoms. Very high blood pressure (hypertensive crisis) may cause: Headache. Fast or irregular heartbeats (palpitations). Shortness of breath. Nosebleed. Nausea and vomiting. Vision changes. Severe chest pain, dizziness, and seizures. How is this diagnosed? This condition  is diagnosed by measuring your blood pressure while you are seated, with your arm resting on a flat surface, your legs uncrossed, and your feet flat on the floor. The cuff of the blood pressure monitor will be placed directly against the skin of your upper arm at the level of your heart. Blood pressure should be measured at least twice using the same arm. Certain conditions can cause a difference in blood pressure between your right and left arms. If you have a high blood pressure reading during one visit or you have normal blood pressure with other risk factors, you may be asked to: Return on a different day to have your blood pressure checked again. Monitor your blood pressure at home for 1 week or longer. If you are diagnosed with hypertension, you may have other blood or imaging tests to help your health care provider understand your overall risk for other conditions. How is this treated? This condition is treated by making healthy lifestyle changes, such as eating healthy foods, exercising more, and reducing your alcohol intake. You may be referred for counseling on a healthy diet and physical activity. Your health care provider may prescribe medicine if lifestyle changes are not enough to get your blood pressure under control and if: Your systolic blood pressure is above 130. Your diastolic blood pressure is above 80. Your personal target blood pressure may vary depending on your medical conditions, your age, and other factors. Follow these instructions at home: Eating and drinking  Eat a diet that is high in fiber and potassium, and low in sodium, added sugar, and fat. An example of this eating plan is called the DASH diet. DASH stands for Dietary Approaches to Stop Hypertension. To eat this  way: Eat plenty of fresh fruits and vegetables. Try to fill one half of your plate at each meal with fruits and vegetables. Eat whole grains, such as whole-wheat pasta, brown rice, or whole-grain bread.  Fill about one fourth of your plate with whole grains. Eat or drink low-fat dairy products, such as skim milk or low-fat yogurt. Avoid fatty cuts of meat, processed or cured meats, and poultry with skin. Fill about one fourth of your plate with lean proteins, such as fish, chicken without skin, beans, eggs, or tofu. Avoid pre-made and processed foods. These tend to be higher in sodium, added sugar, and fat. Reduce your daily sodium intake. Many people with hypertension should eat less than 1,500 mg of sodium a day. Do not drink alcohol if: Your health care provider tells you not to drink. You are pregnant, may be pregnant, or are planning to become pregnant. If you drink alcohol: Limit how much you have to: 0-1 drink a day for women. 0-2 drinks a day for men. Know how much alcohol is in your drink. In the [place] one drink equals one 12 oz bottle of beer (355 mL), one 5 oz glass of wine (148 mL), or one 1 oz glass of hard liquor (44 mL). Lifestyle  Work with your health care provider to maintain a healthy body weight or to lose weight. Ask what an ideal weight is for you. Get at least 30 minutes of exercise that causes your heart to beat faster (aerobic exercise) most days of the week. Activities may include walking, swimming, or biking. Include exercise to strengthen your muscles (resistance exercise), such as Pilates or lifting weights, as part of your weekly exercise routine. Try to do these types of exercises for 30 minutes at least 3 days a week. Do not use any products that contain nicotine or tobacco. These products include cigarettes, chewing tobacco, and vaping devices, such as e-cigarettes. If you need help quitting, ask your health care provider. Monitor your blood pressure at home as told by your health care provider. Keep all follow-up visits. This is important. Medicines Take over-the-counter and prescription medicines only as told by your health care provider. Follow directions  carefully. Blood pressure medicines must be taken as prescribed. Do not skip doses of blood pressure medicine. Doing this puts you at risk for problems and can make the medicine less effective. Ask your health care provider about side effects or reactions to medicines that you should watch for. Contact a health care provider if you: Think you are having a reaction to a medicine you are taking. Have headaches that keep coming back (recurring). Feel dizzy. Have swelling in your ankles. Have trouble with your vision. Get help right away if you: Develop a severe headache or confusion. Have unusual weakness or numbness. Feel faint. Have severe pain in your chest or abdomen. Vomit repeatedly. Have trouble breathing. These symptoms may be an emergency. Get help right away. Call 911. Do not wait to see if the symptoms will go away. Do not drive yourself to the hospital. Summary Hypertension is when the force of blood pumping through your arteries is too strong. If this condition is not controlled, it may put you at risk for serious complications. Your personal target blood pressure may vary depending on your medical conditions, your age, and other factors. For most people, a normal blood pressure is less than 120/80. Hypertension is treated with lifestyle changes, medicines, or a combination of both. Lifestyle changes include losing weight, eating  a healthy, low-sodium diet, exercising more, and limiting alcohol. This information is not intended to replace advice given to you by your health care provider. Make sure you discuss any questions you have with your health care provider. Document Revised: 05/08/2021 Document Reviewed: 05/08/2021 Elsevier Patient Education  2024 [employer].

## 2022-12-27 LAB — COMPREHENSIVE METABOLIC PANEL
ALT: 16 U/L (ref 0–35)
AST: 23 U/L (ref 0–37)
Albumin: 4.4 g/dL (ref 3.5–5.2)
Alkaline Phosphatase: 67 U/L (ref 39–117)
BUN: 14 mg/dL (ref 6–23)
CO2: 30 mEq/L (ref 19–32)
Calcium: 9.9 mg/dL (ref 8.4–10.5)
Chloride: 98 mEq/L (ref 96–112)
Creatinine, Ser: 0.83 mg/dL (ref 0.40–1.20)
GFR: 66.09 mL/min (ref >60.00)
Glucose, Bld: 93 mg/dL (ref 70–99)
Potassium: 4.1 mEq/L (ref 3.5–5.1)
Sodium: 138 mEq/L (ref 135–145)
Total Bilirubin: 0.4 mg/dL (ref 0.2–1.2)
Total Protein: 7.5 g/dL (ref 6.0–8.3)

## 2022-12-27 LAB — CBC WITH DIFFERENTIAL/PLATELET
Basophils Absolute: 0 10*3/uL (ref 0.0–0.1)
Basophils Relative: 0.6 % (ref 0.0–3.0)
Eosinophils Absolute: 0.1 10*3/uL (ref 0.0–0.7)
Eosinophils Relative: 1.9 % (ref 0.0–5.0)
HCT: 40.8 % (ref 36.0–46.0)
Hemoglobin: 13.2 g/dL (ref 12.0–15.0)
Lymphocytes Relative: 41.7 % (ref 12.0–46.0)
Lymphs Abs: 2.8 10*3/uL (ref 0.7–4.0)
MCHC: 32.5 g/dL (ref 30.0–36.0)
MCV: 91.1 fl (ref 78.0–100.0)
Monocytes Absolute: 0.7 10*3/uL (ref 0.1–1.0)
Monocytes Relative: 10.8 % (ref 3.0–12.0)
Neutro Abs: 3 10*3/uL (ref 1.4–7.7)
Neutrophils Relative %: 45 % (ref 43.0–77.0)
Platelets: 253 10*3/uL (ref 150.0–400.0)
RBC: 4.47 Mil/uL (ref 3.87–5.11)
RDW: 14.2 % (ref 11.5–15.5)
WBC: 6.7 10*3/uL (ref 4.0–10.5)

## 2022-12-27 LAB — LIPID PANEL
Cholesterol: 170 mg/dL (ref 0–200)
HDL: 72.2 mg/dL (ref >39.00)
LDL Cholesterol: 73 mg/dL (ref 0–99)
NonHDL: 97.4
Total CHOL/HDL Ratio: 2
Triglycerides: 121 mg/dL (ref 0.0–149.0)
VLDL: 24.2 mg/dL (ref 0.0–40.0)

## 2022-12-27 LAB — HEMOGLOBIN A1C: Hgb A1c MFr Bld: 6 % (ref 4.6–6.5)

## 2022-12-27 LAB — VITAMIN D 25 HYDROXY (VIT D DEFICIENCY, FRACTURES): VITD: 42.3 ng/mL (ref 30.00–100.00)

## 2022-12-27 LAB — TSH: TSH: 3.67 u[IU]/mL (ref 0.35–5.50)

## 2023-01-15 DIAGNOSIS — M9902 Segmental and somatic dysfunction of thoracic region: Secondary | ICD-10-CM | POA: Diagnosis not present

## 2023-01-15 DIAGNOSIS — M62830 Muscle spasm of back: Secondary | ICD-10-CM | POA: Diagnosis not present

## 2023-01-15 DIAGNOSIS — M5417 Radiculopathy, lumbosacral region: Secondary | ICD-10-CM | POA: Diagnosis not present

## 2023-01-15 DIAGNOSIS — M25561 Pain in right knee: Secondary | ICD-10-CM | POA: Diagnosis not present

## 2023-01-15 DIAGNOSIS — M9903 Segmental and somatic dysfunction of lumbar region: Secondary | ICD-10-CM | POA: Diagnosis not present

## 2023-01-15 DIAGNOSIS — M25552 Pain in left hip: Secondary | ICD-10-CM | POA: Diagnosis not present

## 2023-01-29 DIAGNOSIS — M62830 Muscle spasm of back: Secondary | ICD-10-CM | POA: Diagnosis not present

## 2023-01-29 DIAGNOSIS — M9903 Segmental and somatic dysfunction of lumbar region: Secondary | ICD-10-CM | POA: Diagnosis not present

## 2023-01-29 DIAGNOSIS — M5417 Radiculopathy, lumbosacral region: Secondary | ICD-10-CM | POA: Diagnosis not present

## 2023-01-29 DIAGNOSIS — M25561 Pain in right knee: Secondary | ICD-10-CM | POA: Diagnosis not present

## 2023-01-29 DIAGNOSIS — M25552 Pain in left hip: Secondary | ICD-10-CM | POA: Diagnosis not present

## 2023-01-29 DIAGNOSIS — M9902 Segmental and somatic dysfunction of thoracic region: Secondary | ICD-10-CM | POA: Diagnosis not present

## 2023-02-26 DIAGNOSIS — M62830 Muscle spasm of back: Secondary | ICD-10-CM | POA: Diagnosis not present

## 2023-02-26 DIAGNOSIS — M9902 Segmental and somatic dysfunction of thoracic region: Secondary | ICD-10-CM | POA: Diagnosis not present

## 2023-02-26 DIAGNOSIS — M5417 Radiculopathy, lumbosacral region: Secondary | ICD-10-CM | POA: Diagnosis not present

## 2023-02-26 DIAGNOSIS — M25552 Pain in left hip: Secondary | ICD-10-CM | POA: Diagnosis not present

## 2023-02-26 DIAGNOSIS — M9903 Segmental and somatic dysfunction of lumbar region: Secondary | ICD-10-CM | POA: Diagnosis not present

## 2023-02-26 DIAGNOSIS — M25561 Pain in right knee: Secondary | ICD-10-CM | POA: Diagnosis not present

## 2023-03-14 DIAGNOSIS — H02834 Dermatochalasis of left upper eyelid: Secondary | ICD-10-CM | POA: Diagnosis not present

## 2023-03-14 DIAGNOSIS — H02831 Dermatochalasis of right upper eyelid: Secondary | ICD-10-CM | POA: Diagnosis not present

## 2023-03-14 DIAGNOSIS — H524 Presbyopia: Secondary | ICD-10-CM | POA: Diagnosis not present

## 2023-03-14 DIAGNOSIS — H52203 Unspecified astigmatism, bilateral: Secondary | ICD-10-CM | POA: Diagnosis not present

## 2023-03-14 DIAGNOSIS — H35372 Puckering of macula, left eye: Secondary | ICD-10-CM | POA: Diagnosis not present

## 2023-03-14 DIAGNOSIS — H40003 Preglaucoma, unspecified, bilateral: Secondary | ICD-10-CM | POA: Diagnosis not present

## 2023-03-14 DIAGNOSIS — H43813 Vitreous degeneration, bilateral: Secondary | ICD-10-CM | POA: Diagnosis not present

## 2023-03-15 ENCOUNTER — Encounter: Payer: Self-pay | Admitting: Family Medicine

## 2023-03-19 DIAGNOSIS — M9902 Segmental and somatic dysfunction of thoracic region: Secondary | ICD-10-CM | POA: Diagnosis not present

## 2023-03-19 DIAGNOSIS — M5417 Radiculopathy, lumbosacral region: Secondary | ICD-10-CM | POA: Diagnosis not present

## 2023-03-19 DIAGNOSIS — M62830 Muscle spasm of back: Secondary | ICD-10-CM | POA: Diagnosis not present

## 2023-03-19 DIAGNOSIS — M25552 Pain in left hip: Secondary | ICD-10-CM | POA: Diagnosis not present

## 2023-03-19 DIAGNOSIS — M9903 Segmental and somatic dysfunction of lumbar region: Secondary | ICD-10-CM | POA: Diagnosis not present

## 2023-03-19 DIAGNOSIS — M25561 Pain in right knee: Secondary | ICD-10-CM | POA: Diagnosis not present

## 2023-04-01 ENCOUNTER — Encounter: Payer: Self-pay | Admitting: Podiatry

## 2023-04-01 ENCOUNTER — Ambulatory Visit (INDEPENDENT_AMBULATORY_CARE_PROVIDER_SITE_OTHER): Payer: Medicare Other | Admitting: Podiatry

## 2023-04-01 DIAGNOSIS — M79675 Pain in left toe(s): Secondary | ICD-10-CM

## 2023-04-01 DIAGNOSIS — M79674 Pain in right toe(s): Secondary | ICD-10-CM

## 2023-04-01 DIAGNOSIS — B351 Tinea unguium: Secondary | ICD-10-CM

## 2023-04-01 NOTE — Progress Notes (Unsigned)
Subjective:  Patient ID: Terri Sandoval, female    DOB: [birth year],  MRN: [id number]   Terri Sandoval presents to clinic today for:  Chief Complaint  Patient presents with   Nail Problem    RM18: patient is here for routine foot care   Patient notes nails are thick, discolored, elongated and painful in shoegear when trying to ambulate.    PCP is Bradd Canary, MD.  Past Medical History:  Diagnosis Date   Adrenal insufficiency (HCC)    Anxiety    Arthritis    Atrophic vaginitis 11/08/2013   BPV (benign positional vertigo) 05/08/2015   Cataract    Chicken pox as a child   Colon polyps    removed during colonscopy   Diverticulitis    Diverticulosis    GERD (gastroesophageal reflux disease)    Hyperlipidemia    Hypertension    Medicare annual wellness visit, subsequent 05/16/2013   Osteoporosis, unspecified 03/09/2013   Preventative health care 11/07/2015   Skin cancer    behind ear   Urine incontinence    sometimes   Verruca 11/07/2015   Vitamin D deficiency 11/07/2015    Past Surgical History:  Procedure Laterality Date   ABDOMINAL HYSTERECTOMY  97   has ovaries   BREAST BIOPSY Left     FNA   CATARACT EXTRACTION  L 09/11/10, R 10/10/10   DILATION AND CURETTAGE OF UTERUS  1990, 1993, 1997   EYE SURGERY Bilateral 10/10/2010, 07/28/17   cataracts, posterior capsulotomy b/l   TONSILLECTOMY AND ADENOIDECTOMY  1990    Allergies  Allergen Reactions   Sulfamethoxazole-Trimethoprim Other (See Comments)    Very sore, sore in corner of lip   Review of Systems: Negative except as noted in the HPI.  Objective:  Terri Sandoval is a pleasant 82 y.o. female in NAD. AAO x 3.  Vascular Examination: Capillary refill time is 3-5 seconds to toes bilateral. Palpable pedal pulses b/l LE. Digital hair present b/l.  Skin temperature gradient WNL b/l. No varicosities b/l. No cyanosis noted b/l.   Dermatological Examination: Pedal skin with normal turgor, texture and tone b/l. No  open wounds. No interdigital macerations b/l. Toenails x10 are 3mm thick, discolored, dystrophic with subungual debris. There is pain with compression of the nail plates.  They are elongated x10     Latest Ref Rng & Units 12/26/2022    2:52 PM 04/25/2022   11:35 AM  Hemoglobin A1C  Hemoglobin-A1c 4.6 - 6.5 % 6.0  6.2    Assessment/Plan: 1. Pain due to onychomycosis of toenails of both feet    The mycotic toenails were sharply debrided x10 with sterile nail nippers and a power debriding burr to decrease bulk/thickness and length.    Return in about 3 months (around 07/01/2023) for RFC with her husband.   Clerance Lav, DPM, FACFAS [facility]     [address] [place], [place] [address]                Office [phone]  Fax [phone]

## 2023-04-09 DIAGNOSIS — M25552 Pain in left hip: Secondary | ICD-10-CM | POA: Diagnosis not present

## 2023-04-09 DIAGNOSIS — M9903 Segmental and somatic dysfunction of lumbar region: Secondary | ICD-10-CM | POA: Diagnosis not present

## 2023-04-09 DIAGNOSIS — M25561 Pain in right knee: Secondary | ICD-10-CM | POA: Diagnosis not present

## 2023-04-09 DIAGNOSIS — M5417 Radiculopathy, lumbosacral region: Secondary | ICD-10-CM | POA: Diagnosis not present

## 2023-04-09 DIAGNOSIS — M62830 Muscle spasm of back: Secondary | ICD-10-CM | POA: Diagnosis not present

## 2023-04-09 DIAGNOSIS — M9902 Segmental and somatic dysfunction of thoracic region: Secondary | ICD-10-CM | POA: Diagnosis not present

## 2023-04-15 ENCOUNTER — Other Ambulatory Visit (HOSPITAL_BASED_OUTPATIENT_CLINIC_OR_DEPARTMENT_OTHER): Payer: Self-pay

## 2023-04-15 MED ORDER — FLUAD 0.5 ML IM SUSY
0.5000 mL | PREFILLED_SYRINGE | Freq: Once | INTRAMUSCULAR | 0 refills | Status: AC
  Filled 2023-04-15: qty 0.5, 1d supply, fill #0

## 2023-04-30 DIAGNOSIS — M5417 Radiculopathy, lumbosacral region: Secondary | ICD-10-CM | POA: Diagnosis not present

## 2023-04-30 DIAGNOSIS — M25561 Pain in right knee: Secondary | ICD-10-CM | POA: Diagnosis not present

## 2023-04-30 DIAGNOSIS — M9903 Segmental and somatic dysfunction of lumbar region: Secondary | ICD-10-CM | POA: Diagnosis not present

## 2023-04-30 DIAGNOSIS — M25552 Pain in left hip: Secondary | ICD-10-CM | POA: Diagnosis not present

## 2023-04-30 DIAGNOSIS — M9902 Segmental and somatic dysfunction of thoracic region: Secondary | ICD-10-CM | POA: Diagnosis not present

## 2023-04-30 DIAGNOSIS — M62830 Muscle spasm of back: Secondary | ICD-10-CM | POA: Diagnosis not present

## 2023-05-05 DIAGNOSIS — K130 Diseases of lips: Secondary | ICD-10-CM | POA: Diagnosis not present

## 2023-05-05 DIAGNOSIS — L821 Other seborrheic keratosis: Secondary | ICD-10-CM | POA: Diagnosis not present

## 2023-05-05 DIAGNOSIS — L0390 Cellulitis, unspecified: Secondary | ICD-10-CM | POA: Diagnosis not present

## 2023-05-05 DIAGNOSIS — Z85828 Personal history of other malignant neoplasm of skin: Secondary | ICD-10-CM | POA: Diagnosis not present

## 2023-05-05 DIAGNOSIS — Z129 Encounter for screening for malignant neoplasm, site unspecified: Secondary | ICD-10-CM | POA: Diagnosis not present

## 2023-05-05 DIAGNOSIS — D1801 Hemangioma of skin and subcutaneous tissue: Secondary | ICD-10-CM | POA: Diagnosis not present

## 2023-05-05 DIAGNOSIS — L718 Other rosacea: Secondary | ICD-10-CM | POA: Diagnosis not present

## 2023-05-07 ENCOUNTER — Other Ambulatory Visit: Payer: Self-pay | Admitting: Family Medicine

## 2023-05-12 ENCOUNTER — Other Ambulatory Visit: Payer: Self-pay | Admitting: Family Medicine

## 2023-05-23 ENCOUNTER — Ambulatory Visit (INDEPENDENT_AMBULATORY_CARE_PROVIDER_SITE_OTHER): Payer: Medicare Other | Admitting: *Deleted

## 2023-05-23 VITALS — BP 124/74 | Ht 63.0 in | Wt 143.0 lb

## 2023-05-23 DIAGNOSIS — Z0000 Encounter for general adult medical examination without abnormal findings: Secondary | ICD-10-CM

## 2023-05-23 NOTE — Progress Notes (Signed)
Subjective:   Terri Sandoval is a 82 y.o. female who presents for Medicare Annual (Subsequent) preventive examination.  Visit Complete: Virtual I connected with  Lurena Nida on 05/23/23 by a audio enabled telemedicine application and verified that I am speaking with the correct person using two identifiers.  Patient Location: Home  Provider Location: Office/Clinic  I discussed the limitations of evaluation and management by telemedicine. The patient expressed understanding and agreed to proceed.  Vital Signs: Because this visit was a virtual/telehealth visit, some criteria may be missing or patient reported. Any vitals not documented were not able to be obtained and vitals that have been documented are patient reported.  Cardiac Risk Factors include: advanced age (>61men, >36 women);dyslipidemia;hypertension     Objective:    Today's Vitals   05/23/23 1004  BP: 124/74  Weight: 143 lb (64.9 kg)  Height: 5\' 3"  (1.6 m)   Body mass index is 25.33 kg/m.     05/23/2023    9:56 AM 05/22/2022    1:10 PM 04/19/2021    2:24 PM 01/04/2020   11:10 AM 07/25/2017   11:28 AM 05/09/2016   10:09 AM 11/07/2015   11:19 AM  Advanced Directives  Does Patient Have a Medical Advance Directive? Yes Yes Yes Yes Yes Yes Yes  Type of Estate agent of [place];Living will Healthcare Power of [place];Living will Healthcare Power of [place];Living will Healthcare Power of [place];Living will Healthcare Power of [place];Living will Healthcare Power of [place];Living will   Does patient want to make changes to medical advance directive? No - Patient declined No - Patient declined  No - Patient declined     Copy of Healthcare Power of Attorney in Chart? Yes - validated most recent copy scanned in chart (See row information) Yes - validated most recent copy scanned in chart (See row information) Yes - validated most recent copy scanned in chart (See row information) Yes - validated  most recent copy scanned in chart (See row information) Yes Yes     Current Medications (verified) Outpatient Encounter Medications as of 05/23/2023  Medication Sig   Calcium Carbonate Antacid (TUMS PO) Take 1 tablet by mouth as needed.   cetirizine (ZYRTEC) 10 MG tablet Take 10 mg by mouth daily as needed for allergies.   Cholecalciferol (VITAMIN D3) 50 MCG (2000 UT) capsule Take 1 capsule by mouth daily.   FLUoxetine (PROZAC) 20 MG capsule Take 1 capsule (20 mg total) by mouth daily.   glucosamine-chondroitin 500-400 MG tablet Take 1 tablet by mouth daily.   Multiple Vitamin (MULTIVITAMIN) tablet Take 1 tablet by mouth daily. Vitamin pack from [employer]   mupirocin ointment (BACTROBAN) 2 % 1 application 3 (three) times daily. If needed   OMEGA-3 FATTY ACIDS PO Take 1,200 mg by mouth daily.   Pumpkin Seed-Soy Germ (AZO BLADDER CONTROL/GO-LESS PO) Take 1 tablet by mouth in the morning and at bedtime.   ramipril (ALTACE) 10 MG capsule Take 1 capsule (10 mg total) by mouth daily.   simvastatin (ZOCOR) 20 MG tablet Take 1 tablet (20 mg total) by mouth daily at 6 PM.   Skin Protectants, Misc. (AQUAPHOR LIP REPAIR EX) Apply topically at bedtime.   No facility-administered encounter medications on file as of 05/23/2023.    Allergies (verified) Sulfamethoxazole-trimethoprim   History: Past Medical History:  Diagnosis Date   Adrenal insufficiency (HCC)    Anxiety    Arthritis    Atrophic vaginitis 11/08/2013   BPV (benign positional vertigo) 05/08/2015  Cataract    Chicken pox as a child   Colon polyps    removed during colonscopy   Diverticulitis    Diverticulosis    GERD (gastroesophageal reflux disease)    Hyperlipidemia    Hypertension    Medicare annual wellness visit, subsequent 05/16/2013   Osteoporosis, unspecified 03/09/2013   Preventative health care 11/07/2015   Skin cancer    behind ear   Urine incontinence    sometimes   Verruca 11/07/2015   Vitamin D deficiency 11/07/2015    Past Surgical History:  Procedure Laterality Date   ABDOMINAL HYSTERECTOMY  97   has ovaries   BREAST BIOPSY Left     FNA   CATARACT EXTRACTION  L 09/11/10, R 10/10/10   DILATION AND CURETTAGE OF UTERUS  1990, 1993, 1997   EYE SURGERY Bilateral 10/10/2010, 07/28/17   cataracts, posterior capsulotomy b/l   TONSILLECTOMY AND ADENOIDECTOMY  1990   Family History  Problem Relation Age of Onset   Arthritis Mother    Hyperlipidemia Mother    Heart disease Mother    Hypertension Mother    Colon cancer Father    Diabetes Sister        type 2   Heart disease Sister    Hypertension Sister    Hyperlipidemia Sister    Mental illness Maternal Uncle    Stroke Maternal Grandmother    Pneumonia Maternal Grandfather    Cerebral palsy Son        MR   Colon cancer Cousin    Social History   Socioeconomic History   Marital status: Married    Spouse name: Not on file   Number of children: 2   Years of education: Not on file   Highest education level: 12th grade  Occupational History   Occupation: retired  Tobacco Use   Smoking status: Never   Smokeless tobacco: Never  Vaping Use   Vaping status: Never Used  Substance and Sexual Activity   Alcohol use: No   Drug use: No   Sexual activity: Never    Comment: lives with husband and disabled son, no dietary restrictions, not exercising regularly  Other Topics Concern   Not on file  Social History Narrative   Not on file   Social Determinants of Health   Financial Resource Strain: Low Risk  (12/19/2022)   Overall Financial Resource Strain (CARDIA)    Difficulty of Paying Living Expenses: Not hard at all  Food Insecurity: No Food Insecurity (12/19/2022)   Hunger Vital Sign    Worried About Running Out of Food in the Last Year: Never true    Ran Out of Food in the Last Year: Never true  Transportation Needs: No Transportation Needs (12/19/2022)   PRAPARE - Administrator, Civil Service (Medical): No    Lack of  Transportation (Non-Medical): No  Physical Activity: Unknown (12/19/2022)   Exercise Vital Sign    Days of Exercise per Week: 0 days    Minutes of Exercise per Session: Not on file  Recent Concern: Physical Activity - Inactive (12/19/2022)   Exercise Vital Sign    Days of Exercise per Week: 0 days    Minutes of Exercise per Session: 0 min  Stress: No Stress Concern Present (05/23/2023)   [employer] of Occupational Health - Occupational Stress Questionnaire    Feeling of Stress : Only a little  Social Connections: Socially Integrated (12/19/2022)   Social Connection and Isolation Panel [NHANES]    Frequency  of Communication with Friends and Family: More than three times a week    Frequency of Social Gatherings with Friends and Family: More than three times a week    Attends Religious Services: More than 4 times per year    Active Member of [employer] or Organizations: Yes    Attends Engineer, structural: More than 4 times per year    Marital Status: Married    Tobacco Counseling Counseling given: Not Answered   Clinical Intake:  Pre-visit preparation completed: Yes  Pain : No/denies pain     BMI - recorded: 25.33 Nutritional Status: BMI 25 -29 Overweight Nutritional Risks: None Diabetes: No  How often do you need to have someone help you when you read instructions, pamphlets, or other written materials from your doctor or pharmacy?: 1 - Never  Interpreter Needed?: No  Information entered by :: Donne Anon, CMA   Activities of Daily Living    05/23/2023    9:43 AM  In your present state of health, do you have any difficulty performing the following activities:  Hearing? 1  Comment has hearing aids that she wears occasionally  Vision? 0  Difficulty concentrating or making decisions? 0  Walking or climbing stairs? 0  Dressing or bathing? 0  Doing errands, shopping? 0  Preparing Food and eating ? N  Using the Toilet? N  In the past six months, have you  accidently leaked urine? Y  Do you have problems with loss of bowel control? N  Managing your Medications? N  Managing your Finances? N  Housekeeping or managing your Housekeeping? N    Patient Care Team: Bradd Canary, MD as PCP - General (Family Medicine) Armbruster, Willaim Rayas, MD as Consulting Physician (Gastroenterology) Henrene Pastor, RPH-CPP (Pharmacist) Tsamis, Georganna Skeans, MD as Referring Physician (Ophthalmology) Hollar, Ronal Fear, MD as Referring Physician (Dermatology)  Indicate any recent Medical Services you may have received from other than [facility] providers in the past year (date may be approximate).     Assessment:   This is a routine wellness examination for [place].  Hearing/Vision screen No results found.   Goals Addressed   None    Depression Screen    12/26/2022    1:42 PM 05/22/2022    1:08 PM 04/19/2021    2:31 PM 12/25/2020   11:24 AM 06/26/2020   11:59 AM 01/04/2020   11:28 AM 12/04/2017   10:26 AM  PHQ 2/9 Scores  PHQ - 2 Score 0 0 0 0 0 0 0  PHQ- 9 Score     3      Fall Risk    05/23/2023    9:55 AM 12/26/2022    1:40 PM 05/22/2022    1:14 PM 04/25/2022   10:25 AM 12/24/2021   10:21 AM  Fall Risk   Falls in the past year? 1 0 0 0 1  Number falls in past yr: 0 0 0 0 0  Injury with Fall? 0 0 0 0 0  Risk for fall due to : No Fall Risks No Fall Risks History of fall(s);Impaired balance/gait;Impaired vision  History of fall(s);Impaired balance/gait  Follow up Falls evaluation completed Falls evaluation completed Falls prevention discussed Falls evaluation completed Falls evaluation completed;Falls prevention discussed    MEDICARE RISK AT HOME: Medicare Risk at Home Any stairs in or around the home?: No If so, are there any without handrails?: No Home free of loose throw rugs in walkways, pet beds, electrical cords, etc?: Yes Adequate lighting  in your home to reduce risk of falls?: Yes Life alert?: No Use of a cane, walker or w/c?:  No Grab bars in the bathroom?: Yes Shower chair or bench in shower?: Yes (built in seat) Elevated toilet seat or a handicapped toilet?: Yes  TIMED UP AND GO:  Was the test performed?  No    Cognitive Function:    07/25/2017   11:30 AM 05/09/2016   10:17 AM  MMSE - Mini Mental State Exam  Orientation to time 5 5  Orientation to Place 5 5  Registration 3 3  Attention/ Calculation 5 5  Recall 2 3  Language- name 2 objects 2 2  Language- repeat 1 1  Language- follow 3 step command 3 3  Language- read & follow direction 1 1  Write a sentence 1 1  Copy design 1 1  Total score 29 30        05/23/2023   10:04 AM  6CIT Screen  What Year? 0 points  What month? 0 points  What time? 0 points  Count back from 20 0 points  Months in reverse 0 points  Repeat phrase 0 points  Total Score 0 points    Immunizations Immunization History  Administered Date(s) Administered   Fluad Quad(high Dose 65+) 05/04/2019, 04/19/2021, 04/25/2022   Fluad Trivalent(High Dose 65+) 04/15/2023   Influenza, High Dose Seasonal PF 05/09/2016, 05/22/2017, 05/07/2018   Influenza,inj,Quad PF,6+ Mos 04/13/2013, 04/20/2014, 05/08/2015   Influenza-Unspecified 05/04/2020   PFIZER Comirnaty(Gray Top)Covid-19 Tri-Sucrose Vaccine 01/23/2021   PFIZER(Purple Top)SARS-COV-2 Vaccination 08/03/2019, 08/24/2019, 05/29/2020   Pfizer Covid-19 Vaccine Bivalent Booster 85yrs & up 05/30/2021   Pneumococcal Conjugate-13 11/12/2013   Pneumococcal Polysaccharide-23 04/25/2011   Respiratory Syncytial Virus Vaccine,Recomb Aduvanted(Arexvy) 06/12/2022   Tdap 01/12/2010, 09/21/2020   Zoster Recombinant(Shingrix) 06/10/2017, 08/08/2017, 09/08/2017   Zoster, Live 07/16/2011    TDAP status: Up to date  Flu Vaccine status: Up to date  Pneumococcal vaccine status: Up to date  Covid-19 vaccine status: Information provided on how to obtain vaccines.   Qualifies for Shingles Vaccine? Yes   Zostavax completed Yes   Shingrix  Completed?: Yes  Screening Tests Health Maintenance  Topic Date Due   COVID-19 Vaccine (6 - 2023-24 season) 03/16/2023   Medicare Annual Wellness (AWV)  05/22/2024   DTaP/Tdap/Td (3 - Td or Tdap) 09/22/2030   Pneumonia Vaccine 33+ Years old  Completed   INFLUENZA VACCINE  Completed   DEXA SCAN  Completed   Zoster Vaccines- Shingrix  Completed   HPV VACCINES  Aged Out    Health Maintenance  Health Maintenance Due  Topic Date Due   COVID-19 Vaccine (6 - 2023-24 season) 03/16/2023    Colorectal cancer screening: No longer required.   Mammogram status: Completed 10/14/22. Repeat every year  Bone Density status: pt declined today  Lung Cancer Screening: (Low Dose CT Chest recommended if Age 51-80 years, 20 pack-year currently smoking OR have quit w/in 15years.) does not qualify.   Additional Screening:  Hepatitis C Screening: does not qualify   Vision Screening: Recommended annual ophthalmology exams for early detection of glaucoma and other disorders of the eye. Is the patient up to date with their annual eye exam?  Yes  Who is the provider or what is the name of the office in which the patient attends annual eye exams? Dr. Natalia Leatherwood Tsamis If pt is not established with a provider, would they like to be referred to a provider to establish care? No .   Dental Screening:  Recommended annual dental exams for proper oral hygiene  Diabetic Foot Exam: N/a  Community Resource Referral / Chronic Care Management: CRR required this visit?  No   CCM required this visit?  No     Plan:     I have personally reviewed and noted the following in the patient's chart:   Medical and social history Use of alcohol, tobacco or illicit drugs  Current medications and supplements including opioid prescriptions. Patient is not currently taking opioid prescriptions. Functional ability and status Nutritional status Physical activity Advanced directives List of other  physicians Hospitalizations, surgeries, and ER visits in previous 12 months Vitals Screenings to include cognitive, depression, and falls Referrals and appointments  In addition, I have reviewed and discussed with patient certain preventive protocols, quality metrics, and best practice recommendations. A written personalized care plan for preventive services as well as general preventive health recommendations were provided to patient.     Donne Anon, CMA   05/23/2023   After Visit Summary: (MyChart) Due to this being a telephonic visit, the after visit summary with patients personalized plan was offered to patient via MyChart   Nurse Notes: None

## 2023-05-23 NOTE — Patient Instructions (Signed)
[name] , Thank you for taking time to come for your Medicare Wellness Visit. I appreciate your ongoing commitment to your health goals. Please review the following plan we discussed and let me know if I can assist you in the future.     This is a list of the screening recommended for you and due dates:  Health Maintenance  Topic Date Due   COVID-19 Vaccine (6 - 2023-24 season) 03/16/2023   Medicare Annual Wellness Visit  05/22/2024   DTaP/Tdap/Td vaccine (3 - Td or Tdap) 09/22/2030   Pneumonia Vaccine  Completed   Flu Shot  Completed   DEXA scan (bone density measurement)  Completed   Zoster (Shingles) Vaccine  Completed   HPV Vaccine  Aged Out    Next appointment: Follow up in one year for your annual wellness visit.   Preventive Care 3 Years and Older, Female Preventive care refers to lifestyle choices and visits with your health care provider that can promote health and wellness. What does preventive care include? A yearly physical exam. This is also called an annual well check. Dental exams once or twice a year. Routine eye exams. Ask your health care provider how often you should have your eyes checked. Personal lifestyle choices, including: Daily care of your teeth and gums. Regular physical activity. Eating a healthy diet. Avoiding tobacco and drug use. Limiting alcohol use. Practicing safe sex. Taking low-dose aspirin every day. Taking vitamin and mineral supplements as recommended by your health care provider. What happens during an annual well check? The services and screenings done by your health care provider during your annual well check will depend on your age, overall health, lifestyle risk factors, and family history of disease. Counseling  Your health care provider may ask you questions about your: Alcohol use. Tobacco use. Drug use. Emotional well-being. Home and relationship well-being. Sexual activity. Eating habits. History of falls. Memory and  ability to understand (cognition). Work and work Astronomer. Reproductive health. Screening  You may have the following tests or measurements: Height, weight, and BMI. Blood pressure. Lipid and cholesterol levels. These may be checked every 5 years, or more frequently if you are over 106 years old. Skin check. Lung cancer screening. You may have this screening every year starting at age 73 if you have a 30-pack-year history of smoking and currently smoke or have quit within the past 15 years. Fecal occult blood test (FOBT) of the stool. You may have this test every year starting at age 29. Flexible sigmoidoscopy or colonoscopy. You may have a sigmoidoscopy every 5 years or a colonoscopy every 10 years starting at age 53. Hepatitis C blood test. Hepatitis B blood test. Sexually transmitted disease (STD) testing. Diabetes screening. This is done by checking your blood sugar (glucose) after you have not eaten for a while (fasting). You may have this done every 1-3 years. Bone density scan. This is done to screen for osteoporosis. You may have this done starting at age 3. Mammogram. This may be done every 1-2 years. Talk to your health care provider about how often you should have regular mammograms. Talk with your health care provider about your test results, treatment options, and if necessary, the need for more tests. Vaccines  Your health care provider may recommend certain vaccines, such as: Influenza vaccine. This is recommended every year. Tetanus, diphtheria, and acellular pertussis (Tdap, Td) vaccine. You may need a Td booster every 10 years. Zoster vaccine. You may need this after age 35. Pneumococcal  13-valent conjugate (PCV13) vaccine. One dose is recommended after age 37. Pneumococcal polysaccharide (PPSV23) vaccine. One dose is recommended after age 4. Talk to your health care provider about which screenings and vaccines you need and how often you need them. This information is  not intended to replace advice given to you by your health care provider. Make sure you discuss any questions you have with your health care provider. Document Released: 07/28/2015 Document Revised: 03/20/2016 Document Reviewed: 05/02/2015 Elsevier Interactive Patient Education  2017 [employer].  Fall Prevention in the Home Falls can cause injuries. They can happen to people of all ages. There are many things you can do to make your home safe and to help prevent falls. What can I do on the outside of my home? Regularly fix the edges of walkways and driveways and fix any cracks. Remove anything that might make you trip as you walk through a door, such as a raised step or threshold. Trim any bushes or trees on the path to your home. Use bright outdoor lighting. Clear any walking paths of anything that might make someone trip, such as rocks or tools. Regularly check to see if handrails are loose or broken. Make sure that both sides of any steps have handrails. Any raised decks and porches should have guardrails on the edges. Have any leaves, snow, or ice cleared regularly. Use sand or salt on walking paths during winter. Clean up any spills in your garage right away. This includes oil or grease spills. What can I do in the bathroom? Use night lights. Install grab bars by the toilet and in the tub and shower. Do not use towel bars as grab bars. Use non-skid mats or decals in the tub or shower. If you need to sit down in the shower, use a plastic, non-slip stool. Keep the floor dry. Clean up any water that spills on the floor as soon as it happens. Remove soap buildup in the tub or shower regularly. Attach bath mats securely with double-sided non-slip rug tape. Do not have throw rugs and other things on the floor that can make you trip. What can I do in the bedroom? Use night lights. Make sure that you have a light by your bed that is easy to reach. Do not use any sheets or blankets that  are too big for your bed. They should not hang down onto the floor. Have a firm chair that has side arms. You can use this for support while you get dressed. Do not have throw rugs and other things on the floor that can make you trip. What can I do in the kitchen? Clean up any spills right away. Avoid walking on wet floors. Keep items that you use a lot in easy-to-reach places. If you need to reach something above you, use a strong step stool that has a grab bar. Keep electrical cords out of the way. Do not use floor polish or wax that makes floors slippery. If you must use wax, use non-skid floor wax. Do not have throw rugs and other things on the floor that can make you trip. What can I do with my stairs? Do not leave any items on the stairs. Make sure that there are handrails on both sides of the stairs and use them. Fix handrails that are broken or loose. Make sure that handrails are as long as the stairways. Check any carpeting to make sure that it is firmly attached to the stairs. Fix any carpet  that is loose or worn. Avoid having throw rugs at the top or bottom of the stairs. If you do have throw rugs, attach them to the floor with carpet tape. Make sure that you have a light switch at the top of the stairs and the bottom of the stairs. If you do not have them, ask someone to add them for you. What else can I do to help prevent falls? Wear shoes that: Do not have high heels. Have rubber bottoms. Are comfortable and fit you well. Are closed at the toe. Do not wear sandals. If you use a stepladder: Make sure that it is fully opened. Do not climb a closed stepladder. Make sure that both sides of the stepladder are locked into place. Ask someone to hold it for you, if possible. Clearly mark and make sure that you can see: Any grab bars or handrails. First and last steps. Where the edge of each step is. Use tools that help you move around (mobility aids) if they are needed. These  include: Canes. Walkers. Scooters. Crutches. Turn on the lights when you go into a dark area. Replace any light bulbs as soon as they burn out. Set up your furniture so you have a clear path. Avoid moving your furniture around. If any of your floors are uneven, fix them. If there are any pets around you, be aware of where they are. Review your medicines with your doctor. Some medicines can make you feel dizzy. This can increase your chance of falling. Ask your doctor what other things that you can do to help prevent falls. This information is not intended to replace advice given to you by your health care provider. Make sure you discuss any questions you have with your health care provider. Document Released: 04/27/2009 Document Revised: 12/07/2015 Document Reviewed: 08/05/2014 Elsevier Interactive Patient Education  2017 [employer].

## 2023-05-26 NOTE — Progress Notes (Signed)
Subjective:   Terri Sandoval is a 82 y.o. female who presents for Medicare Annual (Subsequent) preventive examination.  Visit Complete: Virtual I connected with  Lurena Nida on 05/26/23 by a audio enabled telemedicine application and verified that I am speaking with the correct person using two identifiers.  Patient Location: Home  Provider Location: Office/Clinic  I discussed the limitations of evaluation and management by telemedicine. The patient expressed understanding and agreed to proceed.  Vital Signs: Because this visit was a virtual/telehealth visit, some criteria may be missing or patient reported. Any vitals not documented were not able to be obtained and vitals that have been documented are patient reported.  Cardiac Risk Factors include: advanced age (>5men, >33 women);dyslipidemia;hypertension     Objective:    Today's Vitals   05/23/23 1004  BP: 124/74  Weight: 143 lb (64.9 kg)  Height: 5\' 3"  (1.6 m)   Body mass index is 25.33 kg/m.     05/23/2023    9:56 AM 05/22/2022    1:10 PM 04/19/2021    2:24 PM 01/04/2020   11:10 AM 07/25/2017   11:28 AM 05/09/2016   10:09 AM 11/07/2015   11:19 AM  Advanced Directives  Does Patient Have a Medical Advance Directive? Yes Yes Yes Yes Yes Yes Yes  Type of Estate agent of [place];Living will Healthcare Power of [place];Living will Healthcare Power of [place];Living will Healthcare Power of [place];Living will Healthcare Power of [place];Living will Healthcare Power of [place];Living will   Does patient want to make changes to medical advance directive? No - Patient declined No - Patient declined  No - Patient declined     Copy of Healthcare Power of Attorney in Chart? Yes - validated most recent copy scanned in chart (See row information) Yes - validated most recent copy scanned in chart (See row information) Yes - validated most recent copy scanned in chart (See row information) Yes - validated  most recent copy scanned in chart (See row information) Yes Yes     Current Medications (verified) Outpatient Encounter Medications as of 05/23/2023  Medication Sig   Calcium Carbonate Antacid (TUMS PO) Take 1 tablet by mouth as needed.   cetirizine (ZYRTEC) 10 MG tablet Take 10 mg by mouth daily as needed for allergies.   Cholecalciferol (VITAMIN D3) 50 MCG (2000 UT) capsule Take 1 capsule by mouth daily.   FLUoxetine (PROZAC) 20 MG capsule Take 1 capsule (20 mg total) by mouth daily.   glucosamine-chondroitin 500-400 MG tablet Take 1 tablet by mouth daily.   Multiple Vitamin (MULTIVITAMIN) tablet Take 1 tablet by mouth daily. Vitamin pack from [facility]   mupirocin ointment (BACTROBAN) 2 % 1 application 3 (three) times daily. If needed   OMEGA-3 FATTY ACIDS PO Take 1,200 mg by mouth daily.   Pumpkin Seed-Soy Germ (AZO BLADDER CONTROL/GO-LESS PO) Take 1 tablet by mouth in the morning and at bedtime.   ramipril (ALTACE) 10 MG capsule Take 1 capsule (10 mg total) by mouth daily.   simvastatin (ZOCOR) 20 MG tablet Take 1 tablet (20 mg total) by mouth daily at 6 PM.   Skin Protectants, Misc. (AQUAPHOR LIP REPAIR EX) Apply topically at bedtime.   No facility-administered encounter medications on file as of 05/23/2023.    Allergies (verified) Sulfamethoxazole-trimethoprim   History: Past Medical History:  Diagnosis Date   Adrenal insufficiency (HCC)    Anxiety    Arthritis    Atrophic vaginitis 11/08/2013   BPV (benign positional vertigo) 05/08/2015  Cataract    Chicken pox as a child   Colon polyps    removed during colonscopy   Diverticulitis    Diverticulosis    GERD (gastroesophageal reflux disease)    Hyperlipidemia    Hypertension    Medicare annual wellness visit, subsequent 05/16/2013   Osteoporosis, unspecified 03/09/2013   Preventative health care 11/07/2015   Skin cancer    behind ear   Urine incontinence    sometimes   [place] 11/07/2015   Vitamin D deficiency 11/07/2015    Past Surgical History:  Procedure Laterality Date   ABDOMINAL HYSTERECTOMY  97   has ovaries   BREAST BIOPSY Left     FNA   CATARACT EXTRACTION  L 09/11/10, R 10/10/10   DILATION AND CURETTAGE OF UTERUS  1990, 1993, 1997   EYE SURGERY Bilateral 10/10/2010, 07/28/17   cataracts, posterior capsulotomy b/l   TONSILLECTOMY AND ADENOIDECTOMY  1990   Family History  Problem Relation Age of Onset   Arthritis Mother    Hyperlipidemia Mother    Heart disease Mother    Hypertension Mother    Colon cancer Father    Diabetes Sister        type 2   Heart disease Sister    Hypertension Sister    Hyperlipidemia Sister    Mental illness Maternal Uncle    Stroke Maternal Grandmother    Pneumonia Maternal Grandfather    Cerebral palsy Son        MR   Colon cancer Cousin    Social History   Socioeconomic History   Marital status: Married    Spouse name: Not on file   Number of children: 2   Years of education: Not on file   Highest education level: 12th grade  Occupational History   Occupation: retired  Tobacco Use   Smoking status: Never   Smokeless tobacco: Never  Vaping Use   Vaping status: Never Used  Substance and Sexual Activity   Alcohol use: No   Drug use: No   Sexual activity: Never    Comment: lives with husband and disabled son, no dietary restrictions, not exercising regularly  Other Topics Concern   Not on file  Social History Narrative   Not on file   Social Determinants of Health   Financial Resource Strain: Low Risk  (12/19/2022)   Overall Financial Resource Strain (CARDIA)    Difficulty of Paying Living Expenses: Not hard at all  Food Insecurity: No Food Insecurity (12/19/2022)   Hunger Vital Sign    Worried About Running Out of Food in the Last Year: Never true    Ran Out of Food in the Last Year: Never true  Transportation Needs: No Transportation Needs (12/19/2022)   PRAPARE - Administrator, Civil Service (Medical): No    Lack of  Transportation (Non-Medical): No  Physical Activity: Unknown (12/19/2022)   Exercise Vital Sign    Days of Exercise per Week: 0 days    Minutes of Exercise per Session: Not on file  Recent Concern: Physical Activity - Inactive (12/19/2022)   Exercise Vital Sign    Days of Exercise per Week: 0 days    Minutes of Exercise per Session: 0 min  Stress: No Stress Concern Present (05/23/2023)   [employer] of Occupational Health - Occupational Stress Questionnaire    Feeling of Stress : Only a little  Social Connections: Socially Integrated (12/19/2022)   Social Connection and Isolation Panel [NHANES]    Frequency  of Communication with Friends and Family: More than three times a week    Frequency of Social Gatherings with Friends and Family: More than three times a week    Attends Religious Services: More than 4 times per year    Active Member of [employer] or Organizations: Yes    Attends Engineer, structural: More than 4 times per year    Marital Status: Married    Tobacco Counseling Counseling given: Not Answered   Clinical Intake:  Pre-visit preparation completed: Yes  Pain : No/denies pain     BMI - recorded: 25.33 Nutritional Status: BMI 25 -29 Overweight Nutritional Risks: None Diabetes: No  How often do you need to have someone help you when you read instructions, pamphlets, or other written materials from your doctor or pharmacy?: 1 - Never  Interpreter Needed?: No  Information entered by :: Donne Anon, CMA   Activities of Daily Living    05/23/2023    9:43 AM  In your present state of health, do you have any difficulty performing the following activities:  Hearing? 1  Comment has hearing aids that she wears occasionally  Vision? 0  Difficulty concentrating or making decisions? 0  Walking or climbing stairs? 0  Dressing or bathing? 0  Doing errands, shopping? 0  Preparing Food and eating ? N  Using the Toilet? N  In the past six months, have you  accidently leaked urine? Y  Do you have problems with loss of bowel control? N  Managing your Medications? N  Managing your Finances? N  Housekeeping or managing your Housekeeping? N    Patient Care Team: Bradd Canary, MD as PCP - General (Family Medicine) Armbruster, Willaim Rayas, MD as Consulting Physician (Gastroenterology) Henrene Pastor, RPH-CPP (Pharmacist) Tsamis, Georganna Skeans, MD as Referring Physician (Ophthalmology) Hollar, Ronal Fear, MD as Referring Physician (Dermatology)  Indicate any recent Medical Services you may have received from other than [facility] providers in the past year (date may be approximate).     Assessment:   This is a routine wellness examination for Ambia.  Hearing/Vision screen No results found.   Goals Addressed   None    Depression Screen    05/26/2023    8:01 AM 05/23/2023   10:03 AM 12/26/2022    1:42 PM 05/22/2022    1:08 PM 04/19/2021    2:31 PM 12/25/2020   11:24 AM 06/26/2020   11:59 AM  PHQ 2/9 Scores  PHQ - 2 Score 0 0 0 0 0 0 0  PHQ- 9 Score       3    Fall Risk    05/23/2023    9:55 AM 12/26/2022    1:40 PM 05/22/2022    1:14 PM 04/25/2022   10:25 AM 12/24/2021   10:21 AM  Fall Risk   Falls in the past year? 1 0 0 0 1  Number falls in past yr: 0 0 0 0 0  Injury with Fall? 0 0 0 0 0  Risk for fall due to : No Fall Risks No Fall Risks History of fall(s);Impaired balance/gait;Impaired vision  History of fall(s);Impaired balance/gait  Follow up Falls evaluation completed Falls evaluation completed Falls prevention discussed Falls evaluation completed Falls evaluation completed;Falls prevention discussed    MEDICARE RISK AT HOME: Medicare Risk at Home Any stairs in or around the home?: No If so, are there any without handrails?: No Home free of loose throw rugs in walkways, pet beds, electrical cords, etc?: Yes Adequate  lighting in your home to reduce risk of falls?: Yes Life alert?: No Use of a cane, walker or w/c?:  No Grab bars in the bathroom?: Yes Shower chair or bench in shower?: Yes (built in seat) Elevated toilet seat or a handicapped toilet?: Yes  TIMED UP AND GO:  Was the test performed?  No    Cognitive Function:    07/25/2017   11:30 AM 05/09/2016   10:17 AM  MMSE - Mini Mental State Exam  Orientation to time 5 5  Orientation to Place 5 5  Registration 3 3  Attention/ Calculation 5 5  Recall 2 3  Language- name 2 objects 2 2  Language- repeat 1 1  Language- follow 3 step command 3 3  Language- read & follow direction 1 1  Write a sentence 1 1  Copy design 1 1  Total score 29 30        05/23/2023   10:04 AM  6CIT Screen  What Year? 0 points  What month? 0 points  What time? 0 points  Count back from 20 0 points  Months in reverse 0 points  Repeat phrase 0 points  Total Score 0 points    Immunizations Immunization History  Administered Date(s) Administered   Fluad Quad(high Dose 65+) 05/04/2019, 04/19/2021, 04/25/2022   Fluad Trivalent(High Dose 65+) 04/15/2023   Influenza, High Dose Seasonal PF 05/09/2016, 05/22/2017, 05/07/2018   Influenza,inj,Quad PF,6+ Mos 04/13/2013, 04/20/2014, 05/08/2015   Influenza-Unspecified 05/04/2020   PFIZER Comirnaty(Gray Top)Covid-19 Tri-Sucrose Vaccine 01/23/2021   PFIZER(Purple Top)SARS-COV-2 Vaccination 08/03/2019, 08/24/2019, 05/29/2020   Pfizer Covid-19 Vaccine Bivalent Booster 60yrs & up 05/30/2021   Pneumococcal Conjugate-13 11/12/2013   Pneumococcal Polysaccharide-23 04/25/2011   Respiratory Syncytial Virus Vaccine,Recomb Aduvanted(Arexvy) 06/12/2022   Tdap 01/12/2010, 09/21/2020   Zoster Recombinant(Shingrix) 06/10/2017, 08/08/2017, 09/08/2017   Zoster, Live 07/16/2011    TDAP status: Up to date  Flu Vaccine status: Up to date  Pneumococcal vaccine status: Up to date  Covid-19 vaccine status: Information provided on how to obtain vaccines.   Qualifies for Shingles Vaccine? Yes   Zostavax completed Yes   Shingrix  Completed?: Yes  Screening Tests Health Maintenance  Topic Date Due   COVID-19 Vaccine (6 - 2023-24 season) 03/16/2023   Medicare Annual Wellness (AWV)  05/22/2024   DTaP/Tdap/Td (3 - Td or Tdap) 09/22/2030   Pneumonia Vaccine 38+ Years old  Completed   INFLUENZA VACCINE  Completed   DEXA SCAN  Completed   Zoster Vaccines- Shingrix  Completed   HPV VACCINES  Aged Out    Health Maintenance  Health Maintenance Due  Topic Date Due   COVID-19 Vaccine (6 - 2023-24 season) 03/16/2023    Colorectal cancer screening: No longer required.   Mammogram status: Completed 10/14/22. Repeat every year  Bone Density status: pt declined today  Lung Cancer Screening: (Low Dose CT Chest recommended if Age 79-80 years, 20 pack-year currently smoking OR have quit w/in 15years.) does not qualify.   Additional Screening:  Hepatitis C Screening: does not qualify   Vision Screening: Recommended annual ophthalmology exams for early detection of glaucoma and other disorders of the eye. Is the patient up to date with their annual eye exam?  Yes  Who is the provider or what is the name of the office in which the patient attends annual eye exams? Dr. Natalia Leatherwood Tsamis If pt is not established with a provider, would they like to be referred to a provider to establish care? No .   Dental  Screening: Recommended annual dental exams for proper oral hygiene  Diabetic Foot Exam: N/a  Community Resource Referral / Chronic Care Management: CRR required this visit?  No   CCM required this visit?  No     Plan:     I have personally reviewed and noted the following in the patient's chart:   Medical and social history Use of alcohol, tobacco or illicit drugs  Current medications and supplements including opioid prescriptions. Patient is not currently taking opioid prescriptions. Functional ability and status Nutritional status Physical activity Advanced directives List of other  physicians Hospitalizations, surgeries, and ER visits in previous 12 months Vitals Screenings to include cognitive, depression, and falls Referrals and appointments  In addition, I have reviewed and discussed with patient certain preventive protocols, quality metrics, and best practice recommendations. A written personalized care plan for preventive services as well as general preventive health recommendations were provided to patient.     Donne Anon, CMA   05/26/2023   After Visit Summary: (MyChart) Due to this being a telephonic visit, the after visit summary with patients personalized plan was offered to patient via MyChart   Nurse Notes: None

## 2023-05-28 DIAGNOSIS — M25561 Pain in right knee: Secondary | ICD-10-CM | POA: Diagnosis not present

## 2023-05-28 DIAGNOSIS — M5417 Radiculopathy, lumbosacral region: Secondary | ICD-10-CM | POA: Diagnosis not present

## 2023-05-28 DIAGNOSIS — M62830 Muscle spasm of back: Secondary | ICD-10-CM | POA: Diagnosis not present

## 2023-05-28 DIAGNOSIS — M25552 Pain in left hip: Secondary | ICD-10-CM | POA: Diagnosis not present

## 2023-05-28 DIAGNOSIS — M9903 Segmental and somatic dysfunction of lumbar region: Secondary | ICD-10-CM | POA: Diagnosis not present

## 2023-05-28 DIAGNOSIS — M9902 Segmental and somatic dysfunction of thoracic region: Secondary | ICD-10-CM | POA: Diagnosis not present

## 2023-06-18 ENCOUNTER — Ambulatory Visit: Payer: Medicare Other | Admitting: Pharmacist

## 2023-06-18 VITALS — Ht 60.0 in | Wt 142.0 lb

## 2023-06-18 DIAGNOSIS — E782 Mixed hyperlipidemia: Secondary | ICD-10-CM

## 2023-06-18 DIAGNOSIS — I10 Essential (primary) hypertension: Secondary | ICD-10-CM

## 2023-06-18 NOTE — Progress Notes (Signed)
Pharmacy Note  06/18/2023 Name: Terri Sandoval MRN: [id number] DOB: Dec 24, 1940  Subjective: Terri Sandoval is a 82 y.o. year old female who is a primary care patient of Bradd Canary, MD. Clinical Pharmacist Practitioner referral was placed to assist with medication management.    Engaged with patient by telephone for follow up visit today.  Hypertension -  Currently taking ramipril 10mg  daily  Checks blood pressure about once a week - she did not have any readings to report today.  She continues to do her own housework and exercises sometime / sporadically.   Anxiety-  Current therapy: fluoxetine 20mg  daily.  Has some stress related to her handicapped son. He is living in a group home but does visit patients home some weekends and holidays   Diarrhea:  Takes Citrucel every day. Since she has been able to get Citrucel regularly again, diarrhea has improved.   Hyperlipidemia - taking simvastatin 20mg  daily. LDL goal < 100. Last LDL was 78.   Objective: Review of patient status, including review of consultants reports, laboratory and other test data, was performed as part of comprehensive.  Body mass index is 27.73 kg/m.   Lab Results  Component Value Date   CREATININE 0.83 12/26/2022   CREATININE 0.85 04/25/2022   CREATININE 0.77 10/23/2021    Lab Results  Component Value Date   HGBA1C 6.0 12/26/2022       Component Value Date/Time   CHOL 170 12/26/2022 1452   TRIG 121.0 12/26/2022 1452   HDL 72.20 12/26/2022 1452   CHOLHDL 2 12/26/2022 1452   VLDL 24.2 12/26/2022 1452   LDLCALC 73 12/26/2022 1452   LDLCALC 76 05/16/2020 0812     Clinical ASCVD: No  The ASCVD Risk score (Arnett DK, et al., 2019) failed to calculate for the following reasons:   The 2019 ASCVD risk score is only valid for ages 20 to 3    BP Readings from Last 3 Encounters:  05/23/23 124/74  12/26/22 (!) 139/51  09/02/22 (!) 129/54     Allergies  Allergen Reactions    Sulfamethoxazole-Trimethoprim Other (See Comments)    Very sore, sore in corner of lip    Medications Reviewed Today   Medications were not reviewed in this encounter     Patient Active Problem List   Diagnosis Date Noted   Knee pain, right 12/26/2022   Pedal edema 04/25/2022   Posterior vitreous detachment of both eyes 02/27/2022   Chronic left sacroiliac pain 11/02/2020   Abnormal TSH 11/02/2020   Lip lesion 05/04/2019   Allergy 11/15/2018   Fall 12/04/2017   Hyperglycemia 12/04/2017   Abnormal results of thyroid function studies 05/26/2017   Vitamin D deficiency 11/07/2015   Preventative health care 11/07/2015   Verruca 11/07/2015   BPV (benign positional vertigo) 05/08/2015   Skin cancer 10/31/2014   Atrophic vaginitis 11/08/2013   Cerumen impaction 06/11/2013   Medicare annual wellness visit, subsequent 05/16/2013   Osteoporosis 03/09/2013   GERD (gastroesophageal reflux disease)    Hypertension    Anxiety    Hyperlipidemia    Arthritis    Chicken pox    Diverticulosis    Colon polyps    Urine incontinence      Medication Assistance:  None required.  Patient affirms current coverage meets needs.   Assessment / Plan: Hypertension - Controlled Continue ramipril 10mg  daily Continue to check blood pressure at home once per week - goal < 130/80 Anxiety  Continue fluoxetine daily.  Diarrhea -  Continue Citrucel daily  Hyperlipidemia - tat goal LDL of < 100 Continue simvastatin 20mg  daily.   Medication management:  Reviewed and updated medication list Reviewed refill history and adherence   Follow Up:  Telephone follow up appointment with care management team member scheduled for:  1 year.   Henrene Pastor, PharmD Clinical Pharmacist [facility]  - [facility] [phone]

## 2023-06-30 ENCOUNTER — Ambulatory Visit: Payer: Medicare Other | Admitting: Family Medicine

## 2023-07-01 ENCOUNTER — Encounter: Payer: Self-pay | Admitting: Podiatry

## 2023-07-01 ENCOUNTER — Ambulatory Visit (INDEPENDENT_AMBULATORY_CARE_PROVIDER_SITE_OTHER): Payer: Medicare Other | Admitting: Podiatry

## 2023-07-01 DIAGNOSIS — M79675 Pain in left toe(s): Secondary | ICD-10-CM

## 2023-07-01 DIAGNOSIS — B351 Tinea unguium: Secondary | ICD-10-CM | POA: Diagnosis not present

## 2023-07-01 DIAGNOSIS — M79674 Pain in right toe(s): Secondary | ICD-10-CM

## 2023-07-06 NOTE — Progress Notes (Signed)
  Subjective:  Patient ID: Terri Sandoval, female    DOB: 1941-04-07,  MRN: [id number]  82 y.o. female presents to clinic with  painful elongated mycotic toenails 1-5 bilaterally which are tender when wearing enclosed shoe gear. Pain is relieved with periodic professional debridement.  Chief Complaint  Patient presents with   Nail Problem    PATIENT STATES SHE WILL SEE HER PCP NEXT MONTH PATIENT STATES      New problem(s): None   PCP is Bradd Canary, MD.  Allergies  Allergen Reactions   Sulfamethoxazole-Trimethoprim Other (See Comments)    Very sore, sore in corner of lip    Review of Systems: Negative except as noted in the HPI.   Objective:  Terri Sandoval is a pleasant 82 y.o. female WD, WN in NAD.Marland Kitchen AAO x 3.  Vascular Examination: Vascular status intact b/l with palpable pedal pulses. CFT immediate b/l. No edema. No pain with calf compression b/l. Skin temperature gradient WNL b/l.   Neurological Examination: Sensation grossly intact b/l with 10 gram monofilament. Vibratory sensation intact b/l.   Dermatological Examination: Pedal skin with normal turgor, texture and tone b/l. Toenails 1-5 b/l thick, discolored, elongated with subungual debris and pain on dorsal palpation. No hyperkeratotic lesions noted b/l.   Musculoskeletal Examination: Muscle strength 5/5 to b/l LE. No pain, crepitus or joint limitation noted with ROM bilateral LE. No gross bony deformities bilaterally.  Radiographs: None  Last A1c:      Latest Ref Rng & Units 12/26/2022    2:52 PM  Hemoglobin A1C  Hemoglobin-A1c 4.6 - 6.5 % 6.0      Assessment:   1. Pain due to onychomycosis of toenails of both feet     Plan:  Patient was evaluated and treated. All patient's and/or POA's questions/concerns addressed on today's visit. Toenails 1-5 debrided in length and girth without incident. Continue soft, supportive shoe gear daily. Report any pedal injuries to medical professional. Call office if there  are any questions/concerns. -Patient/POA to call should there be question/concern in the interim.  Return in about 3 months (around 09/29/2023).  Freddie Breech, DPM      [place] LOCATION: [address], [place] [address]                   Office [phone]   [facility] LOCATION: [address] [place], [place] [address] Office [phone]

## 2023-07-23 DIAGNOSIS — M25552 Pain in left hip: Secondary | ICD-10-CM | POA: Diagnosis not present

## 2023-07-23 DIAGNOSIS — M62830 Muscle spasm of back: Secondary | ICD-10-CM | POA: Diagnosis not present

## 2023-07-23 DIAGNOSIS — M9902 Segmental and somatic dysfunction of thoracic region: Secondary | ICD-10-CM | POA: Diagnosis not present

## 2023-07-23 DIAGNOSIS — M5417 Radiculopathy, lumbosacral region: Secondary | ICD-10-CM | POA: Diagnosis not present

## 2023-07-23 DIAGNOSIS — M9903 Segmental and somatic dysfunction of lumbar region: Secondary | ICD-10-CM | POA: Diagnosis not present

## 2023-07-23 DIAGNOSIS — M25561 Pain in right knee: Secondary | ICD-10-CM | POA: Diagnosis not present

## 2023-08-13 NOTE — Assessment & Plan Note (Signed)
monitor

## 2023-08-14 ENCOUNTER — Encounter: Payer: Self-pay | Admitting: Family Medicine

## 2023-08-14 ENCOUNTER — Ambulatory Visit: Payer: Medicare Other | Admitting: Family Medicine

## 2023-08-14 VITALS — BP 122/76 | HR 77 | Temp 98.0°F | Resp 16 | Ht 60.0 in | Wt 145.8 lb

## 2023-08-14 DIAGNOSIS — E782 Mixed hyperlipidemia: Secondary | ICD-10-CM

## 2023-08-14 DIAGNOSIS — E559 Vitamin D deficiency, unspecified: Secondary | ICD-10-CM | POA: Diagnosis not present

## 2023-08-14 DIAGNOSIS — M818 Other osteoporosis without current pathological fracture: Secondary | ICD-10-CM

## 2023-08-14 DIAGNOSIS — I10 Essential (primary) hypertension: Secondary | ICD-10-CM | POA: Diagnosis not present

## 2023-08-14 DIAGNOSIS — R739 Hyperglycemia, unspecified: Secondary | ICD-10-CM

## 2023-08-14 DIAGNOSIS — R7989 Other specified abnormal findings of blood chemistry: Secondary | ICD-10-CM

## 2023-08-14 LAB — COMPREHENSIVE METABOLIC PANEL
ALT: 17 U/L (ref 0–35)
AST: 24 U/L (ref 0–37)
Albumin: 4.5 g/dL (ref 3.5–5.2)
Alkaline Phosphatase: 70 U/L (ref 39–117)
BUN: 14 mg/dL (ref 6–23)
CO2: 33 meq/L — ABNORMAL HIGH (ref 19–32)
Calcium: 10.1 mg/dL (ref 8.4–10.5)
Chloride: 99 meq/L (ref 96–112)
Creatinine, Ser: 0.84 mg/dL (ref 0.40–1.20)
GFR: 64.86 mL/min (ref >60.00)
Glucose, Bld: 70 mg/dL (ref 70–99)
Potassium: 4.3 meq/L (ref 3.5–5.1)
Sodium: 140 meq/L (ref 135–145)
Total Bilirubin: 0.3 mg/dL (ref 0.2–1.2)
Total Protein: 7.3 g/dL (ref 6.0–8.3)

## 2023-08-14 LAB — CBC WITH DIFFERENTIAL/PLATELET
Basophils Absolute: 0 10*3/uL (ref 0.0–0.1)
Basophils Relative: 0.6 % (ref 0.0–3.0)
Eosinophils Absolute: 0.1 10*3/uL (ref 0.0–0.7)
Eosinophils Relative: 1.5 % (ref 0.0–5.0)
HCT: 41.2 % (ref 36.0–46.0)
Hemoglobin: 13.8 g/dL (ref 12.0–15.0)
Lymphocytes Relative: 38.1 % (ref 12.0–46.0)
Lymphs Abs: 2.2 10*3/uL (ref 0.7–4.0)
MCHC: 33.4 g/dL (ref 30.0–36.0)
MCV: 90.7 fL (ref 78.0–100.0)
Monocytes Absolute: 0.6 10*3/uL (ref 0.1–1.0)
Monocytes Relative: 11.1 % (ref 3.0–12.0)
Neutro Abs: 2.9 10*3/uL (ref 1.4–7.7)
Neutrophils Relative %: 48.7 % (ref 43.0–77.0)
Platelets: 246 10*3/uL (ref 150.0–400.0)
RBC: 4.54 Mil/uL (ref 3.87–5.11)
RDW: 14.6 % (ref 11.5–15.5)
WBC: 5.9 10*3/uL (ref 4.0–10.5)

## 2023-08-14 LAB — LIPID PANEL
Cholesterol: 175 mg/dL (ref 0–200)
HDL: 76.5 mg/dL (ref >39.00)
LDL Cholesterol: 71 mg/dL (ref 0–99)
NonHDL: 98.48
Total CHOL/HDL Ratio: 2
Triglycerides: 137 mg/dL (ref 0.0–149.0)
VLDL: 27.4 mg/dL (ref 0.0–40.0)

## 2023-08-14 LAB — HEMOGLOBIN A1C: Hgb A1c MFr Bld: 6.3 % (ref 4.6–6.5)

## 2023-08-14 LAB — VITAMIN D 25 HYDROXY (VIT D DEFICIENCY, FRACTURES): VITD: 43.01 ng/mL (ref 30.00–100.00)

## 2023-08-14 LAB — TSH: TSH: 4.58 u[IU]/mL (ref 0.35–5.50)

## 2023-08-14 NOTE — Patient Instructions (Signed)
Prevnar 20 once Flu and COVID annually

## 2023-08-15 ENCOUNTER — Encounter: Payer: Self-pay | Admitting: Family Medicine

## 2023-08-17 ENCOUNTER — Encounter: Payer: Self-pay | Admitting: Family Medicine

## 2023-08-17 NOTE — Assessment & Plan Note (Signed)
 Encourage heart healthy diet such as MIND or DASH diet, increase exercise, avoid trans fats, simple carbohydrates and processed foods, consider a krill or fish or flaxseed oil cap daily.

## 2023-08-17 NOTE — Assessment & Plan Note (Signed)
 hgba1c acceptable, minimize simple carbs. Increase exercise as tolerated.

## 2023-08-17 NOTE — Assessment & Plan Note (Signed)
 Well controlled, no changes to meds. Encouraged heart healthy diet such as the DASH diet and exercise as tolerated.

## 2023-08-17 NOTE — Progress Notes (Signed)
Subjective:    Patient ID: Terri Sandoval, female    DOB: 07/20/1940, 83 y.o.   MRN: [id number]  Chief Complaint  Patient presents with   Follow-up    6 month    HPI Discussed the use of AI scribe software for clinical note transcription with the patient, who gave verbal consent to proceed.  History of Present Illness   The patient, an elderly woman with a history of regular mammograms, eye appointments, chiropractor visits, and podiatrist visits, presents for a routine check-up. She last had a mammogram in April 2024 and is considering whether to continue these at her age. She has regular contact with a pharmacist who assists with her medication management and has an upcoming eye appointment in March. She has been having regular checks for visual field changes and has noticed some changes in her vision, such as failing a driver's license vision test. She also visits a podiatrist every three months for onychomycosis management. She has been seeing a chiropractor monthly, which she finds helpful. The patient's husband tested positive for COVID-19 in December, and she had respiratory symptoms around the same time but did not test positive. She has been considering whether to take vitamin C, probiotics, and glucosamine based on a Consumer Reports article.        Past Medical History:  Diagnosis Date   Adrenal insufficiency (HCC)    Anxiety    Arthritis    Atrophic vaginitis 11/08/2013   BPV (benign positional vertigo) 05/08/2015   Cataract    Chicken pox as a child   Colon polyps    removed during colonscopy   Diverticulitis    Diverticulosis    GERD (gastroesophageal reflux disease)    Hyperlipidemia    Hypertension    Medicare annual wellness visit, subsequent 05/16/2013   Osteoporosis, unspecified 03/09/2013   Preventative health care 11/07/2015   Skin cancer    behind ear   Urine incontinence    sometimes   Verruca 11/07/2015   Vitamin D deficiency 11/07/2015    Past  Surgical History:  Procedure Laterality Date   ABDOMINAL HYSTERECTOMY  97   has ovaries   BREAST BIOPSY Left     FNA   CATARACT EXTRACTION  L 09/11/10, R 10/10/10   DILATION AND CURETTAGE OF UTERUS  1990, 1993, 1997   EYE SURGERY Bilateral 10/10/2010, 07/28/17   cataracts, posterior capsulotomy b/l   TONSILLECTOMY AND ADENOIDECTOMY  1990    Family History  Problem Relation Age of Onset   Arthritis Mother    Hyperlipidemia Mother    Heart disease Mother    Hypertension Mother    Colon cancer Father    Diabetes Sister        type 2   Heart disease Sister    Hypertension Sister    Hyperlipidemia Sister    Mental illness Maternal Uncle    Stroke Maternal Grandmother    Pneumonia Maternal Grandfather    Cerebral palsy Son        MR   Colon cancer Cousin     Social History   Socioeconomic History   Marital status: Married    Spouse name: Not on file   Number of children: 2   Years of education: Not on file   Highest education level: 12th grade  Occupational History   Occupation: retired  Tobacco Use   Smoking status: Never   Smokeless tobacco: Never  Vaping Use   Vaping status: Never Used  Substance and Sexual  Activity   Alcohol use: No   Drug use: No   Sexual activity: Never    Comment: lives with husband and disabled son, no dietary restrictions, not exercising regularly  Other Topics Concern   Not on file  Social History Narrative   Not on file   Social Drivers of Health   Financial Resource Strain: Low Risk  (08/07/2023)   Overall Financial Resource Strain (CARDIA)    Difficulty of Paying Living Expenses: Not hard at all  Food Insecurity: No Food Insecurity (08/07/2023)   Hunger Vital Sign    Worried About Running Out of Food in the Last Year: Never true    Ran Out of Food in the Last Year: Never true  Transportation Needs: No Transportation Needs (08/07/2023)   PRAPARE - Administrator, Civil Service (Medical): No    Lack of Transportation  (Non-Medical): No  Physical Activity: Unknown (08/07/2023)   Exercise Vital Sign    Days of Exercise per Week: 0 days    Minutes of Exercise per Session: Not on file  Stress: No Stress Concern Present (08/07/2023)   [employer] of Occupational Health - Occupational Stress Questionnaire    Feeling of Stress : Not at all  Social Connections: Socially Integrated (08/07/2023)   Social Connection and Isolation Panel [NHANES]    Frequency of Communication with Friends and Family: More than three times a week    Frequency of Social Gatherings with Friends and Family: Once a week    Attends Religious Services: More than 4 times per year    Active Member of [employer] or Organizations: Yes    Attends Engineer, structural: More than 4 times per year    Marital Status: Married  Catering manager Violence: Not At Risk (05/23/2023)   Humiliation, Afraid, Rape, and Kick questionnaire    Fear of Current or Ex-Partner: No    Emotionally Abused: No    Physically Abused: No    Sexually Abused: No    Outpatient Medications Prior to Visit  Medication Sig Dispense Refill   Calcium Carbonate Antacid (TUMS PO) Take 1 tablet by mouth as needed.     cetirizine (ZYRTEC) 10 MG tablet Take 10 mg by mouth daily as needed for allergies.     Cholecalciferol (VITAMIN D3) 50 MCG (2000 UT) capsule Take 1 capsule by mouth daily.     FLUoxetine (PROZAC) 20 MG capsule Take 1 capsule (20 mg total) by mouth daily. 90 capsule 1   glucosamine-chondroitin 500-400 MG tablet Take 1 tablet by mouth daily.     methylcellulose oral powder Take 1 packet by mouth daily. Citrucel     Multiple Vitamin (MULTIVITAMIN) tablet Take 1 tablet by mouth daily. Vitamin pack from [employer]     mupirocin ointment (BACTROBAN) 2 % 1 application 3 (three) times daily. If needed     OMEGA-3 FATTY ACIDS PO Take 1,200 mg by mouth daily.     Pumpkin Seed-Soy Germ (AZO BLADDER CONTROL/GO-LESS PO) Take 1 tablet by mouth in the morning and at bedtime.      ramipril (ALTACE) 10 MG capsule Take 1 capsule (10 mg total) by mouth daily. 90 capsule 1   simvastatin (ZOCOR) 20 MG tablet Take 1 tablet (20 mg total) by mouth daily at 6 PM. 90 tablet 1   Skin Protectants, Misc. (AQUAPHOR LIP REPAIR EX) Apply topically at bedtime.     No facility-administered medications prior to visit.    Allergies  Allergen Reactions   Sulfamethoxazole-Trimethoprim  Other (See Comments)    Very sore, sore in corner of lip    Review of Systems  Constitutional:  Negative for fever and malaise/fatigue.  HENT:  Negative for congestion.   Eyes:  Negative for blurred vision.  Respiratory:  Negative for shortness of breath.   Cardiovascular:  Negative for chest pain, palpitations and leg swelling.  Gastrointestinal:  Negative for abdominal pain, blood in stool and nausea.  Genitourinary:  Negative for dysuria and frequency.  Musculoskeletal:  Negative for falls.  Skin:  Negative for rash.  Neurological:  Negative for dizziness, loss of consciousness and headaches.  Endo/Heme/Allergies:  Negative for environmental allergies.  Psychiatric/Behavioral:  Negative for depression. The patient is not nervous/anxious.        Objective:    Physical Exam Constitutional:      General: She is not in acute distress.    Appearance: Normal appearance. She is well-developed. She is not toxic-appearing.  HENT:     Head: Normocephalic and atraumatic.     Right Ear: External ear normal.     Left Ear: External ear normal.     Nose: Nose normal.  Eyes:     General:        Right eye: No discharge.        Left eye: No discharge.     Conjunctiva/sclera: Conjunctivae normal.  Neck:     Thyroid: No thyromegaly.  Cardiovascular:     Rate and Rhythm: Normal rate and regular rhythm.     Heart sounds: Normal heart sounds. No murmur heard. Pulmonary:     Effort: Pulmonary effort is normal. No respiratory distress.     Breath sounds: Normal breath sounds.  Abdominal:      General: Bowel sounds are normal.     Palpations: Abdomen is soft.     Tenderness: There is no abdominal tenderness. There is no guarding.  Musculoskeletal:        General: Normal range of motion.     Cervical back: Neck supple.  Lymphadenopathy:     Cervical: No cervical adenopathy.  Skin:    General: Skin is warm and dry.  Neurological:     Mental Status: She is alert and oriented to person, place, and time.  Psychiatric:        Mood and Affect: Mood normal.        Behavior: Behavior normal.        Thought Content: Thought content normal.        Judgment: Judgment normal.     BP 122/76 (BP Location: Left Arm, Patient Position: Sitting, Cuff Size: Normal)   Pulse 77   Temp 98 F (36.7 C) (Oral)   Resp 16   Ht 5' (1.524 m)   Wt 145 lb 12.8 oz (66.1 kg)   SpO2 97%   BMI 28.47 kg/m  Wt Readings from Last 3 Encounters:  08/14/23 145 lb 12.8 oz (66.1 kg)  06/18/23 142 lb (64.4 kg)  05/23/23 143 lb (64.9 kg)    Diabetic Foot Exam - Simple   No data filed    Lab Results  Component Value Date   WBC 5.9 08/14/2023   HGB 13.8 08/14/2023   HCT 41.2 08/14/2023   PLT 246.0 08/14/2023   GLUCOSE 70 08/14/2023   CHOL 175 08/14/2023   TRIG 137.0 08/14/2023   HDL 76.50 08/14/2023   LDLCALC 71 08/14/2023   ALT 17 08/14/2023   AST 24 08/14/2023   NA 140 08/14/2023   K 4.3 08/14/2023  CL 99 08/14/2023   CREATININE 0.84 08/14/2023   BUN 14 08/14/2023   CO2 33 (H) 08/14/2023   TSH 4.58 08/14/2023   HGBA1C 6.3 08/14/2023   MICROALBUR <0.7 05/04/2018    Lab Results  Component Value Date   TSH 4.58 08/14/2023   Lab Results  Component Value Date   WBC 5.9 08/14/2023   HGB 13.8 08/14/2023   HCT 41.2 08/14/2023   MCV 90.7 08/14/2023   PLT 246.0 08/14/2023   Lab Results  Component Value Date   NA 140 08/14/2023   K 4.3 08/14/2023   CO2 33 (H) 08/14/2023   GLUCOSE 70 08/14/2023   BUN 14 08/14/2023   CREATININE 0.84 08/14/2023   BILITOT 0.3 08/14/2023   ALKPHOS  70 08/14/2023   AST 24 08/14/2023   ALT 17 08/14/2023   PROT 7.3 08/14/2023   ALBUMIN 4.5 08/14/2023   CALCIUM 10.1 08/14/2023   GFR 64.86 08/14/2023   Lab Results  Component Value Date   CHOL 175 08/14/2023   Lab Results  Component Value Date   HDL 76.50 08/14/2023   Lab Results  Component Value Date   LDLCALC 71 08/14/2023   Lab Results  Component Value Date   TRIG 137.0 08/14/2023   Lab Results  Component Value Date   CHOLHDL 2 08/14/2023   Lab Results  Component Value Date   HGBA1C 6.3 08/14/2023       Assessment & Plan:  Abnormal TSH Assessment & Plan: monitor   Vitamin D deficiency Assessment & Plan: Supplement and monitor   Orders: -     VITAMIN D 25 Hydroxy (Vit-D Deficiency, Fractures)  Other osteoporosis, unspecified pathological fracture presence  Hyperglycemia Assessment & Plan: hgba1c acceptable, minimize simple carbs. Increase exercise as tolerated.   Orders: -     Hemoglobin A1c  Mixed hyperlipidemia Assessment & Plan: Encourage heart healthy diet such as MIND or DASH diet, increase exercise, avoid trans fats, simple carbohydrates and processed foods, consider a krill or fish or flaxseed oil cap daily.   Orders: -     Lipid panel  Primary hypertension Assessment & Plan: Well controlled, no changes to meds. Encouraged heart healthy diet such as the DASH diet and exercise as tolerated.   Orders: -     Comprehensive metabolic panel -     CBC with Differential/Platelet -     TSH    Assessment and Plan    Breast Cancer Screening Last mammogram in April 2024. Discussed the risks/benefits of continuing annual mammograms vs biennial mammograms given age and radiation exposure. Patient has a history of noticing a breast lump in the past that was aspirated. -Consider biennial mammograms moving forward. Next mammogram due in April 2026 unless patient notices any changes in breast tissue.  Eye Health Upcoming eye appointment in March  2025 for field vision test. Patient has failed a vision test for driver's license renewal and now requires glasses for driving. -Continue regular eye exams as scheduled by ophthalmologist.  Onychomycosis Chronic condition, no current symptoms. Discussed the nature of the condition and the challenges of treatment. -No active treatment needed at this time.  Vaccinations Discussed the benefits of annual flu and COVID-19 boosters, as well as the new Prevnar 20 pneumonia vaccine. -Administer Prevnar 20 vaccine today. -Consider annual flu and COVID-19 boosters in the fall.  Hearing Health Both patients have hearing aids. One patient's hearing aids recently broke and have been reordered. Requested ear examination. -Examine ears today.  General Health Maintenance Regular  appointments with a podiatrist and a chiropractor. Upcoming appointment with a pharmacist in December 2025. -Continue regular appointments as scheduled.  Supplements Discussed the potential benefits and limitations of vitamin C, probiotics, and glucosamine chondroitin based on a recent Consumer Reports article. -Continue current supplement regimen as tolerated. Consider probiotics during and after antibiotic use. Consider a 16-month trial of glucosamine chondroitin for joint issues.  Follow-up in 6 months unless issues arise sooner.         Danise Edge, MD

## 2023-08-17 NOTE — Assessment & Plan Note (Signed)
 Supplement and monitor

## 2023-08-27 DIAGNOSIS — M25561 Pain in right knee: Secondary | ICD-10-CM | POA: Diagnosis not present

## 2023-08-27 DIAGNOSIS — M9902 Segmental and somatic dysfunction of thoracic region: Secondary | ICD-10-CM | POA: Diagnosis not present

## 2023-08-27 DIAGNOSIS — M5417 Radiculopathy, lumbosacral region: Secondary | ICD-10-CM | POA: Diagnosis not present

## 2023-08-27 DIAGNOSIS — M25552 Pain in left hip: Secondary | ICD-10-CM | POA: Diagnosis not present

## 2023-08-27 DIAGNOSIS — M62830 Muscle spasm of back: Secondary | ICD-10-CM | POA: Diagnosis not present

## 2023-08-27 DIAGNOSIS — M9903 Segmental and somatic dysfunction of lumbar region: Secondary | ICD-10-CM | POA: Diagnosis not present

## 2023-09-19 DIAGNOSIS — H40003 Preglaucoma, unspecified, bilateral: Secondary | ICD-10-CM | POA: Diagnosis not present

## 2023-09-22 DIAGNOSIS — M62830 Muscle spasm of back: Secondary | ICD-10-CM | POA: Diagnosis not present

## 2023-09-22 DIAGNOSIS — M9903 Segmental and somatic dysfunction of lumbar region: Secondary | ICD-10-CM | POA: Diagnosis not present

## 2023-09-22 DIAGNOSIS — M9902 Segmental and somatic dysfunction of thoracic region: Secondary | ICD-10-CM | POA: Diagnosis not present

## 2023-09-22 DIAGNOSIS — M5417 Radiculopathy, lumbosacral region: Secondary | ICD-10-CM | POA: Diagnosis not present

## 2023-09-22 DIAGNOSIS — M25552 Pain in left hip: Secondary | ICD-10-CM | POA: Diagnosis not present

## 2023-09-22 DIAGNOSIS — M25561 Pain in right knee: Secondary | ICD-10-CM | POA: Diagnosis not present

## 2023-09-29 ENCOUNTER — Ambulatory Visit: Payer: Medicare Other | Admitting: Podiatry

## 2023-10-13 ENCOUNTER — Ambulatory Visit (INDEPENDENT_AMBULATORY_CARE_PROVIDER_SITE_OTHER): Admitting: Podiatry

## 2023-10-13 DIAGNOSIS — B351 Tinea unguium: Secondary | ICD-10-CM | POA: Diagnosis not present

## 2023-10-13 DIAGNOSIS — M79675 Pain in left toe(s): Secondary | ICD-10-CM

## 2023-10-13 DIAGNOSIS — M79674 Pain in right toe(s): Secondary | ICD-10-CM

## 2023-10-13 NOTE — Progress Notes (Unsigned)
       Subjective:  Patient ID: Terri Sandoval, female    DOB: 04-19-1941,  MRN: [id number]   Terri Sandoval presents to clinic today for:  Chief Complaint  Patient presents with   Nail Problem    Nail trim    Patient notes nails are thick, discolored, elongated and painful in shoegear when trying to ambulate.    PCP is Bradd Canary, MD.  Past Medical History:  Diagnosis Date   Adrenal insufficiency (HCC)    Anxiety    Arthritis    Atrophic vaginitis 11/08/2013   BPV (benign positional vertigo) 05/08/2015   Cataract    Chicken pox as a child   Colon polyps    removed during colonscopy   Diverticulitis    Diverticulosis    GERD (gastroesophageal reflux disease)    Hyperlipidemia    Hypertension    Medicare annual wellness visit, subsequent 05/16/2013   Osteoporosis, unspecified 03/09/2013   Preventative health care 11/07/2015   Skin cancer    behind ear   Urine incontinence    sometimes   Verruca 11/07/2015   Vitamin D deficiency 11/07/2015    Past Surgical History:  Procedure Laterality Date   ABDOMINAL HYSTERECTOMY  97   has ovaries   BREAST BIOPSY Left     FNA   CATARACT EXTRACTION  L 09/11/10, R 10/10/10   DILATION AND CURETTAGE OF UTERUS  1990, 1993, 1997   EYE SURGERY Bilateral 10/10/2010, 07/28/17   cataracts, posterior capsulotomy b/l   TONSILLECTOMY AND ADENOIDECTOMY  1990    Allergies  Allergen Reactions   Sulfamethoxazole-Trimethoprim Other (See Comments)    Very sore, sore in corner of lip   Review of Systems: Negative except as noted in the HPI.  Objective:  Terri Sandoval is a pleasant 83 y.o. female in NAD. AAO x 3.  Vascular Examination: Capillary refill time is 3-5 seconds to toes bilateral. Palpable pedal pulses b/l LE. Digital hair present b/l.  Skin temperature gradient WNL b/l. No varicosities b/l. No cyanosis noted b/l.   Dermatological Examination: Pedal skin with normal turgor, texture and tone b/l. No open wounds. No interdigital  macerations b/l. Toenails x10 are 3mm thick, discolored, dystrophic with subungual debris. There is pain with compression of the nail plates.  They are elongated x10     Latest Ref Rng & Units 08/14/2023   11:52 AM 12/26/2022    2:52 PM  Hemoglobin A1C  Hemoglobin-A1c 4.6 - 6.5 % 6.3  6.0    Assessment/Plan: 1. Pain due to onychomycosis of toenails of both feet    The mycotic toenails were sharply debrided x10 with sterile nail nippers and a power debriding burr to decrease bulk/thickness and length.    Return in about 3 months (around 01/12/2024) for RFC.   Clerance Lav, DPM, FACFAS [facility]     [address] [place], [place] [address]                Office [phone]  Fax [phone]

## 2023-10-22 DIAGNOSIS — M9903 Segmental and somatic dysfunction of lumbar region: Secondary | ICD-10-CM | POA: Diagnosis not present

## 2023-10-22 DIAGNOSIS — M9902 Segmental and somatic dysfunction of thoracic region: Secondary | ICD-10-CM | POA: Diagnosis not present

## 2023-10-22 DIAGNOSIS — M62830 Muscle spasm of back: Secondary | ICD-10-CM | POA: Diagnosis not present

## 2023-10-22 DIAGNOSIS — M25552 Pain in left hip: Secondary | ICD-10-CM | POA: Diagnosis not present

## 2023-10-22 DIAGNOSIS — M5417 Radiculopathy, lumbosacral region: Secondary | ICD-10-CM | POA: Diagnosis not present

## 2023-10-22 DIAGNOSIS — M25561 Pain in right knee: Secondary | ICD-10-CM | POA: Diagnosis not present

## 2023-10-30 ENCOUNTER — Other Ambulatory Visit: Payer: Self-pay | Admitting: Family Medicine

## 2023-11-13 ENCOUNTER — Other Ambulatory Visit (HOSPITAL_BASED_OUTPATIENT_CLINIC_OR_DEPARTMENT_OTHER): Payer: Self-pay

## 2023-11-13 MED ORDER — PNEUMOCOCCAL 20-VAL CONJ VACC 0.5 ML IM SUSY
0.5000 mL | PREFILLED_SYRINGE | Freq: Once | INTRAMUSCULAR | 0 refills | Status: AC
  Filled 2023-11-13: qty 0.5, 1d supply, fill #0

## 2023-11-19 DIAGNOSIS — M62830 Muscle spasm of back: Secondary | ICD-10-CM | POA: Diagnosis not present

## 2023-11-19 DIAGNOSIS — M25561 Pain in right knee: Secondary | ICD-10-CM | POA: Diagnosis not present

## 2023-11-19 DIAGNOSIS — M9903 Segmental and somatic dysfunction of lumbar region: Secondary | ICD-10-CM | POA: Diagnosis not present

## 2023-11-19 DIAGNOSIS — M9902 Segmental and somatic dysfunction of thoracic region: Secondary | ICD-10-CM | POA: Diagnosis not present

## 2023-11-19 DIAGNOSIS — M25552 Pain in left hip: Secondary | ICD-10-CM | POA: Diagnosis not present

## 2023-11-19 DIAGNOSIS — M5417 Radiculopathy, lumbosacral region: Secondary | ICD-10-CM | POA: Diagnosis not present

## 2023-12-22 DIAGNOSIS — M9902 Segmental and somatic dysfunction of thoracic region: Secondary | ICD-10-CM | POA: Diagnosis not present

## 2023-12-22 DIAGNOSIS — M62830 Muscle spasm of back: Secondary | ICD-10-CM | POA: Diagnosis not present

## 2023-12-22 DIAGNOSIS — M25552 Pain in left hip: Secondary | ICD-10-CM | POA: Diagnosis not present

## 2023-12-22 DIAGNOSIS — M25561 Pain in right knee: Secondary | ICD-10-CM | POA: Diagnosis not present

## 2023-12-22 DIAGNOSIS — M9903 Segmental and somatic dysfunction of lumbar region: Secondary | ICD-10-CM | POA: Diagnosis not present

## 2023-12-22 DIAGNOSIS — M5417 Radiculopathy, lumbosacral region: Secondary | ICD-10-CM | POA: Diagnosis not present

## 2024-01-13 ENCOUNTER — Ambulatory Visit (INDEPENDENT_AMBULATORY_CARE_PROVIDER_SITE_OTHER): Admitting: Podiatry

## 2024-01-13 ENCOUNTER — Encounter: Payer: Self-pay | Admitting: Podiatry

## 2024-01-13 DIAGNOSIS — M79674 Pain in right toe(s): Secondary | ICD-10-CM | POA: Diagnosis not present

## 2024-01-13 DIAGNOSIS — B351 Tinea unguium: Secondary | ICD-10-CM | POA: Diagnosis not present

## 2024-01-13 DIAGNOSIS — M79675 Pain in left toe(s): Secondary | ICD-10-CM | POA: Diagnosis not present

## 2024-01-13 NOTE — Progress Notes (Signed)
       Subjective:  Patient ID: Terri Sandoval, female    DOB: March 24, 1941,  MRN: [id number]   Terri Sandoval presents to clinic today for:  Chief Complaint  Patient presents with   RFC     RFC toe nail trim. Non diabetic.   Patient notes nails are thick, discolored, elongated and painful in shoegear when trying to ambulate.    PCP is Domenica Harlene LABOR, MD.  Past Medical History:  Diagnosis Date   Adrenal insufficiency (HCC)    Anxiety    Arthritis    Atrophic vaginitis 11/08/2013   BPV (benign positional vertigo) 05/08/2015   Cataract    Chicken pox as a child   Colon polyps    removed during colonscopy   Diverticulitis    Diverticulosis    GERD (gastroesophageal reflux disease)    Hyperlipidemia    Hypertension    Medicare annual wellness visit, subsequent 05/16/2013   Osteoporosis, unspecified 03/09/2013   Preventative health care 11/07/2015   Skin cancer    behind ear   Urine incontinence    sometimes   Verruca 11/07/2015   Vitamin D  deficiency 11/07/2015    Past Surgical History:  Procedure Laterality Date   ABDOMINAL HYSTERECTOMY  97   has ovaries   BREAST BIOPSY Left     FNA   CATARACT EXTRACTION  L 09/11/10, R 10/10/10   DILATION AND CURETTAGE OF UTERUS  1990, 1993, 1997   EYE SURGERY Bilateral 10/10/2010, 07/28/17   cataracts, posterior capsulotomy b/l   TONSILLECTOMY AND ADENOIDECTOMY  1990    Allergies  Allergen Reactions   Sulfamethoxazole-Trimethoprim Other (See Comments)    Very sore, sore in corner of lip   Review of Systems: Negative except as noted in the HPI.  Objective:  Terri Sandoval is a pleasant 83 y.o. female in NAD. AAO x 3.  Vascular Examination: Capillary refill time is 3-5 seconds to toes bilateral. Palpable pedal pulses b/l LE. Digital hair present b/l.  Skin temperature gradient WNL b/l. No varicosities b/l. No cyanosis noted b/l.   Dermatological Examination: Pedal skin with normal turgor, texture and tone b/l. No open wounds. No  interdigital macerations b/l. Toenails x10 are 3mm thick, discolored, dystrophic with subungual debris. There is pain with compression of the nail plates.  They are elongated x10     Latest Ref Rng & Units 08/14/2023   11:52 AM  Hemoglobin A1C  Hemoglobin-A1c 4.6 - 6.5 % 6.3    Assessment/Plan: 1. Pain due to onychomycosis of toenails of both feet    The mycotic toenails were sharply debrided x10 with sterile nail nippers and a power debriding burr to decrease bulk/thickness and length.    Return in about 3 months (around 04/14/2024) for RFC.   Terri Sandoval, DPM, FACFAS [facility]     [address] [place], [place] [address]                Office [phone]  Fax [phone]

## 2024-01-21 DIAGNOSIS — M25552 Pain in left hip: Secondary | ICD-10-CM | POA: Diagnosis not present

## 2024-01-21 DIAGNOSIS — M9903 Segmental and somatic dysfunction of lumbar region: Secondary | ICD-10-CM | POA: Diagnosis not present

## 2024-01-21 DIAGNOSIS — M62830 Muscle spasm of back: Secondary | ICD-10-CM | POA: Diagnosis not present

## 2024-01-21 DIAGNOSIS — M25561 Pain in right knee: Secondary | ICD-10-CM | POA: Diagnosis not present

## 2024-01-21 DIAGNOSIS — M25511 Pain in right shoulder: Secondary | ICD-10-CM | POA: Diagnosis not present

## 2024-01-21 DIAGNOSIS — M9902 Segmental and somatic dysfunction of thoracic region: Secondary | ICD-10-CM | POA: Diagnosis not present

## 2024-01-21 DIAGNOSIS — M25521 Pain in right elbow: Secondary | ICD-10-CM | POA: Diagnosis not present

## 2024-01-21 DIAGNOSIS — M5417 Radiculopathy, lumbosacral region: Secondary | ICD-10-CM | POA: Diagnosis not present

## 2024-02-18 DIAGNOSIS — M9903 Segmental and somatic dysfunction of lumbar region: Secondary | ICD-10-CM | POA: Diagnosis not present

## 2024-02-18 DIAGNOSIS — M25511 Pain in right shoulder: Secondary | ICD-10-CM | POA: Diagnosis not present

## 2024-02-18 DIAGNOSIS — M9902 Segmental and somatic dysfunction of thoracic region: Secondary | ICD-10-CM | POA: Diagnosis not present

## 2024-02-18 DIAGNOSIS — M5417 Radiculopathy, lumbosacral region: Secondary | ICD-10-CM | POA: Diagnosis not present

## 2024-02-18 DIAGNOSIS — M25552 Pain in left hip: Secondary | ICD-10-CM | POA: Diagnosis not present

## 2024-02-18 DIAGNOSIS — M25521 Pain in right elbow: Secondary | ICD-10-CM | POA: Diagnosis not present

## 2024-02-18 DIAGNOSIS — M25561 Pain in right knee: Secondary | ICD-10-CM | POA: Diagnosis not present

## 2024-02-18 DIAGNOSIS — M62830 Muscle spasm of back: Secondary | ICD-10-CM | POA: Diagnosis not present

## 2024-03-17 DIAGNOSIS — M25552 Pain in left hip: Secondary | ICD-10-CM | POA: Diagnosis not present

## 2024-03-17 DIAGNOSIS — M5417 Radiculopathy, lumbosacral region: Secondary | ICD-10-CM | POA: Diagnosis not present

## 2024-03-17 DIAGNOSIS — M25521 Pain in right elbow: Secondary | ICD-10-CM | POA: Diagnosis not present

## 2024-03-17 DIAGNOSIS — M9902 Segmental and somatic dysfunction of thoracic region: Secondary | ICD-10-CM | POA: Diagnosis not present

## 2024-03-17 DIAGNOSIS — M9903 Segmental and somatic dysfunction of lumbar region: Secondary | ICD-10-CM | POA: Diagnosis not present

## 2024-03-17 DIAGNOSIS — M62830 Muscle spasm of back: Secondary | ICD-10-CM | POA: Diagnosis not present

## 2024-03-17 DIAGNOSIS — M25561 Pain in right knee: Secondary | ICD-10-CM | POA: Diagnosis not present

## 2024-03-17 DIAGNOSIS — M25511 Pain in right shoulder: Secondary | ICD-10-CM | POA: Diagnosis not present

## 2024-03-26 DIAGNOSIS — H40003 Preglaucoma, unspecified, bilateral: Secondary | ICD-10-CM | POA: Diagnosis not present

## 2024-03-26 DIAGNOSIS — H35372 Puckering of macula, left eye: Secondary | ICD-10-CM | POA: Diagnosis not present

## 2024-03-26 DIAGNOSIS — H02834 Dermatochalasis of left upper eyelid: Secondary | ICD-10-CM | POA: Diagnosis not present

## 2024-03-26 DIAGNOSIS — H524 Presbyopia: Secondary | ICD-10-CM | POA: Diagnosis not present

## 2024-03-26 DIAGNOSIS — H52203 Unspecified astigmatism, bilateral: Secondary | ICD-10-CM | POA: Diagnosis not present

## 2024-03-26 DIAGNOSIS — H02831 Dermatochalasis of right upper eyelid: Secondary | ICD-10-CM | POA: Diagnosis not present

## 2024-03-26 DIAGNOSIS — H43813 Vitreous degeneration, bilateral: Secondary | ICD-10-CM | POA: Diagnosis not present

## 2024-04-13 ENCOUNTER — Encounter: Payer: Self-pay | Admitting: Podiatry

## 2024-04-13 ENCOUNTER — Ambulatory Visit (INDEPENDENT_AMBULATORY_CARE_PROVIDER_SITE_OTHER): Admitting: Podiatry

## 2024-04-13 DIAGNOSIS — B351 Tinea unguium: Secondary | ICD-10-CM | POA: Diagnosis not present

## 2024-04-13 DIAGNOSIS — M79675 Pain in left toe(s): Secondary | ICD-10-CM | POA: Diagnosis not present

## 2024-04-13 DIAGNOSIS — M79674 Pain in right toe(s): Secondary | ICD-10-CM

## 2024-04-13 NOTE — Progress Notes (Signed)
       Subjective:  Patient ID: Terri Sandoval, female    DOB: 06-08-1941,  MRN: [id number]   Terri Sandoval presents to clinic today for:  Chief Complaint  Patient presents with   [facility]    [facility] Non diabetic toenail trim. 0 pain.   Patient notes nails are thick, discolored, elongated and painful in shoegear when trying to ambulate.    PCP is Domenica Harlene LABOR, MD.  Past Medical History:  Diagnosis Date   Adrenal insufficiency    Anxiety    Arthritis    Atrophic vaginitis 11/08/2013   BPV (benign positional vertigo) 05/08/2015   Cataract    Chicken pox as a child   Colon polyps    removed during colonscopy   Diverticulitis    Diverticulosis    GERD (gastroesophageal reflux disease)    Hyperlipidemia    Hypertension    Medicare annual wellness visit, subsequent 05/16/2013   Osteoporosis, unspecified 03/09/2013   Preventative health care 11/07/2015   Skin cancer    behind ear   Urine incontinence    sometimes   Verruca 11/07/2015   Vitamin D  deficiency 11/07/2015    Past Surgical History:  Procedure Laterality Date   ABDOMINAL HYSTERECTOMY  97   has ovaries   BREAST BIOPSY Left     FNA   CATARACT EXTRACTION  L 09/11/10, R 10/10/10   DILATION AND CURETTAGE OF UTERUS  1990, 1993, 1997   EYE SURGERY Bilateral 10/10/2010, 07/28/17   cataracts, posterior capsulotomy b/l   TONSILLECTOMY AND ADENOIDECTOMY  1990    Allergies  Allergen Reactions   Sulfamethoxazole-Trimethoprim Other (See Comments)    Very sore, sore in corner of lip   Review of Systems: Negative except as noted in the HPI.  Objective:  Terri Sandoval is a pleasant 83 y.o. female in NAD. AAO x 3.  Vascular Examination: Capillary refill time is 3-5 seconds to toes bilateral. Palpable pedal pulses b/l LE. Digital hair present b/l.  Skin temperature gradient WNL b/l. No varicosities b/l. No cyanosis noted b/l.   Dermatological Examination: Pedal skin with normal turgor, texture and tone b/l. No open wounds. No  interdigital macerations b/l. Toenails x10 are 3mm thick, discolored, dystrophic with subungual debris. There is pain with compression of the nail plates.  They are elongated x10     Latest Ref Rng & Units 08/14/2023   11:52 AM  Hemoglobin A1C  Hemoglobin-A1c 4.6 - 6.5 % 6.3    Assessment/Plan: 1. Pain due to onychomycosis of toenails of both feet    The mycotic toenails were sharply debrided x10 with sterile nail nippers and a power debriding burr to decrease bulk/thickness and length.    Return in about 3 months (around 07/13/2024) for RFC.   Terri Sandoval, DPM, FACFAS [facility]     [address] [place], [place] [address]                Office [phone]  Fax [phone]

## 2024-04-14 DIAGNOSIS — M5417 Radiculopathy, lumbosacral region: Secondary | ICD-10-CM | POA: Diagnosis not present

## 2024-04-14 DIAGNOSIS — M62830 Muscle spasm of back: Secondary | ICD-10-CM | POA: Diagnosis not present

## 2024-04-14 DIAGNOSIS — M9902 Segmental and somatic dysfunction of thoracic region: Secondary | ICD-10-CM | POA: Diagnosis not present

## 2024-04-14 DIAGNOSIS — M25511 Pain in right shoulder: Secondary | ICD-10-CM | POA: Diagnosis not present

## 2024-04-14 DIAGNOSIS — M25552 Pain in left hip: Secondary | ICD-10-CM | POA: Diagnosis not present

## 2024-04-14 DIAGNOSIS — M25561 Pain in right knee: Secondary | ICD-10-CM | POA: Diagnosis not present

## 2024-04-14 DIAGNOSIS — M25521 Pain in right elbow: Secondary | ICD-10-CM | POA: Diagnosis not present

## 2024-04-14 DIAGNOSIS — M9903 Segmental and somatic dysfunction of lumbar region: Secondary | ICD-10-CM | POA: Diagnosis not present

## 2024-04-28 ENCOUNTER — Other Ambulatory Visit: Payer: Self-pay | Admitting: Family Medicine

## 2024-05-05 DIAGNOSIS — K130 Diseases of lips: Secondary | ICD-10-CM | POA: Diagnosis not present

## 2024-05-05 DIAGNOSIS — L820 Inflamed seborrheic keratosis: Secondary | ICD-10-CM | POA: Diagnosis not present

## 2024-05-05 DIAGNOSIS — L718 Other rosacea: Secondary | ICD-10-CM | POA: Diagnosis not present

## 2024-05-05 DIAGNOSIS — Z85828 Personal history of other malignant neoplasm of skin: Secondary | ICD-10-CM | POA: Diagnosis not present

## 2024-05-05 DIAGNOSIS — Z129 Encounter for screening for malignant neoplasm, site unspecified: Secondary | ICD-10-CM | POA: Diagnosis not present

## 2024-05-05 DIAGNOSIS — L0390 Cellulitis, unspecified: Secondary | ICD-10-CM | POA: Diagnosis not present

## 2024-05-11 ENCOUNTER — Ambulatory Visit (INDEPENDENT_AMBULATORY_CARE_PROVIDER_SITE_OTHER): Admitting: *Deleted

## 2024-05-11 DIAGNOSIS — Z23 Encounter for immunization: Secondary | ICD-10-CM

## 2024-05-25 ENCOUNTER — Ambulatory Visit: Payer: Medicare Other | Admitting: *Deleted

## 2024-05-25 VITALS — BP 133/69 | Temp 96.9°F | Ht 60.0 in | Wt 143.0 lb

## 2024-05-25 DIAGNOSIS — Z0000 Encounter for general adult medical examination without abnormal findings: Secondary | ICD-10-CM

## 2024-05-25 NOTE — Progress Notes (Signed)
 Please attest this visit in the absence of patient primary care provider.    Subjective:   Terri Sandoval is a 83 y.o. female who presents for a Medicare Annual Wellness Visit.  Allergies (verified) Sulfamethoxazole-trimethoprim   History: Past Medical History:  Diagnosis Date   Adrenal insufficiency    Anxiety    Arthritis    Atrophic vaginitis 11/08/2013   BPV (benign positional vertigo) 05/08/2015   Cataract    Chicken pox as a child   Colon polyps    removed during colonscopy   Diverticulitis    Diverticulosis    GERD (gastroesophageal reflux disease)    Hyperlipidemia    Hypertension    Medicare annual wellness visit, subsequent 05/16/2013   Osteoporosis, unspecified 03/09/2013   Preventative health care 11/07/2015   Skin cancer    behind ear   Urine incontinence    sometimes   Verruca 11/07/2015   Vitamin D  deficiency 11/07/2015   Past Surgical History:  Procedure Laterality Date   ABDOMINAL HYSTERECTOMY  97   has ovaries   BREAST BIOPSY Left     FNA   CATARACT EXTRACTION  L 09/11/10, R 10/10/10   DILATION AND CURETTAGE OF UTERUS  1990, 1993, 1997   EYE SURGERY Bilateral 10/10/2010, 07/28/17   cataracts, posterior capsulotomy b/l   TONSILLECTOMY AND ADENOIDECTOMY  1990   Family History  Problem Relation Age of Onset   Arthritis Mother    Hyperlipidemia Mother    Heart disease Mother    Hypertension Mother    Colon cancer Father    Diabetes Sister        type 2   Heart disease Sister    Hypertension Sister    Hyperlipidemia Sister    Mental illness Maternal Uncle    Stroke Maternal Grandmother    Pneumonia Maternal Grandfather    Cerebral palsy Son        MR   Colon cancer Cousin    Social History   Occupational History   Occupation: retired  Tobacco Use   Smoking status: Never   Smokeless tobacco: Never  Vaping Use   Vaping status: Never Used  Substance and Sexual Activity   Alcohol use: No   Drug use: No   Sexual activity: Never     Comment: lives with husband and disabled son, no dietary restrictions, not exercising regularly   Tobacco Counseling Counseling given: Not Answered  SDOH Screenings   Food Insecurity: No Food Insecurity (05/25/2024)  Housing: Low Risk  (05/25/2024)  Transportation Needs: No Transportation Needs (05/25/2024)  Utilities: Not At Risk (05/25/2024)  Alcohol Screen: Low Risk  (05/23/2023)  Depression (PHQ2-9): Low Risk  (05/25/2024)  Financial Resource Strain: Low Risk  (08/07/2023)  Physical Activity: Inactive (05/25/2024)  Social Connections: Socially Integrated (05/25/2024)  Stress: No Stress Concern Present (05/25/2024)  Tobacco Use: Low Risk  (05/25/2024)  Health Literacy: Adequate Health Literacy (05/23/2023)   Depression Screen    05/25/2024   10:07 AM 05/26/2023    8:01 AM 05/23/2023   10:03 AM 12/26/2022    1:42 PM 05/22/2022    1:08 PM 04/19/2021    2:31 PM 12/25/2020   11:24 AM  PHQ 2/9 Scores  PHQ - 2 Score 0 0 0 0 0 0 0  PHQ- 9 Score 0           Goals Addressed               This Visit's Progress     Maintain  current health (pt-stated)   On track      Visit info / Clinical Intake: Medicare Wellness Visit Type:: Subsequent Annual Wellness Visit Persons participating in visit:: patient Medicare Wellness Visit Mode:: Telephone If telephone:: video declined Because this visit was a virtual/telehealth visit:: pt reported vitals If Telephone or Video please confirm:: I discussed the limitations of evaluation and management by telemedicine; I connected with the patient using audio enabled telemedicine application and verified that I am speaking with the correct person using two identifiers; The patient expressed understanding and agreed to proceed Patient Location:: home Provider Location:: office Information given by:: patient Interpreter Needed?: No Pre-visit prep was completed: yes AWV questionnaire completed by patient prior to visit?: no Living arrangements::  lives with spouse/significant other Patient's Overall Health Status Rating: very good (Praise the Lord) Typical amount of pain: none (goes to Chiropractor once a month for low back pain) Does pain affect daily life?: no Are you currently prescribed opioids?: no  Dietary Habits and Nutritional Risks How many meals a day?: 3 Eats fruit and vegetables daily?: yes Most meals are obtained by: preparing own meals In the last 2 weeks, have you had any of the following?: (!) nausea, vomiting, diarrhea (States she has ongoing issues with intermittent diarrhea and has been to GI. Takes Citrucel) Diabetic:: no  Functional Status Activities of Daily Living (to include ambulation/medication): Independent Ambulation: Independent Medication Administration: Independent Home Management: Independent Manage your own finances?: yes Primary transportation is: driving Concerns about vision?: no *vision screening is required for [facility]* (up to date Dr Huey) Concerns about hearing?: no (has hearing aids usually in crowds)  Fall Screening Falls in the past year?: 0 Number of falls in past year: 0 Was there an injury with Fall?: 0 Fall Risk Category Calculator: 0 Patient Fall Risk Level: Low Fall Risk  Fall Risk Patient at Risk for Falls Due to: Impaired balance/gait Fall risk Follow up: Falls evaluation completed  Home and Transportation Safety: All rugs have non-skid backing?: (!) no All stairs or steps have railings?: N/A, no stairs Grab bars in the bathtub or shower?: yes Have non-skid surface in bathtub or shower?: yes Good home lighting?: yes Regular seat belt use?: yes Hospital stays in the last year:: no  Cognitive Assessment Difficulty concentrating, remembering, or making decisions? : yes (some forgetfulness) Will 6CIT or Mini Cog be Completed: yes What year is it?: 0 points What month is it?: 0 points Give patient an address phrase to remember (5 components): [address], [place]  [place]  About what time is it?: 0 points Count backwards from 20 to 1: 0 points Say the months of the year in reverse: 0 points Repeat the address phrase from earlier: 2 points 6 CIT Score: 2 points  Advance Directives (For Healthcare) Does Patient Have a Medical Advance Directive?: Yes Type of Advance Directive: Healthcare Power of Attorney Copy of Healthcare Power of Attorney in Chart?: Yes - validated most recent copy scanned in chart (See row information)  Reviewed/Updated  Reviewed/Updated: Reviewed All (Medical, Surgical, Family, Medications, Allergies, Care Teams, Patient Goals)        Objective:    Today's Vitals   05/25/24 0940  BP: 133/69  Temp: (!) 96.9 F (36.1 C)  TempSrc: Oral  SpO2: 97%  Weight: 143 lb (64.9 kg)  Height: 5' (1.524 m)   Body mass index is 27.93 kg/m.  Current Medications (verified) Outpatient Encounter Medications as of 05/25/2024  Medication Sig   Calcium Carbonate Antacid (TUMS PO)  Take 1 tablet by mouth as needed.   cetirizine (ZYRTEC) 10 MG tablet Take 10 mg by mouth daily as needed for allergies.   Cholecalciferol (VITAMIN D3) 50 MCG (2000 UT) capsule Take 1 capsule by mouth daily.   FLUoxetine  (PROZAC ) 20 MG capsule TAKE 1 CAPSULE(20 MG) BY MOUTH DAILY   glucosamine-chondroitin 500-400 MG tablet Take 1 tablet by mouth daily.   methylcellulose oral powder Take 1 packet by mouth daily. Citrucel   Multiple Vitamin (MULTIVITAMIN) tablet Take 1 tablet by mouth daily. Vitamin pack from [employer]   OMEGA-3 FATTY ACIDS PO Take 1,200 mg by mouth daily.   Pumpkin Seed-Soy Germ (AZO BLADDER CONTROL/GO-LESS PO) Take 1 tablet by mouth in the morning and at bedtime.   ramipril  (ALTACE ) 10 MG capsule TAKE 1 CAPSULE(10 MG) BY MOUTH DAILY   simvastatin  (ZOCOR ) 20 MG tablet TAKE 1 TABLET(20 MG) BY MOUTH DAILY AT 6 PM   Skin Protectants, Misc. (AQUAPHOR LIP REPAIR EX) Apply topically at bedtime.   [DISCONTINUED] mupirocin ointment (BACTROBAN) 2 % 1  application 3 (three) times daily. If needed (Patient not taking: Reported on 05/25/2024)   No facility-administered encounter medications on file as of 05/25/2024.   Hearing/Vision screen No results found. Immunizations and Health Maintenance Health Maintenance  Topic Date Due   COVID-19 Vaccine (6 - 2025-26 season) 05/25/2025 (Originally 03/15/2024)   Medicare Annual Wellness (AWV)  05/25/2025   DTaP/Tdap/Td (3 - Td or Tdap) 09/22/2030   Pneumococcal Vaccine: 50+ Years  Completed   Influenza Vaccine  Completed   DEXA SCAN  Completed   Zoster Vaccines- Shingrix  Completed   Meningococcal B Vaccine  Aged Out   Mammogram  Discontinued        Assessment/Plan:  This is a routine wellness examination for [place].  Patient Care Team: Domenica Harlene LABOR, MD as PCP - General (Family Medicine) Armbruster, Elspeth SQUIBB, MD as Consulting Physician (Gastroenterology) Carla Milling, RPH-CPP (Pharmacist) Tsamis, Comer Pillow, MD as Referring Physician (Ophthalmology) Hollar, Lahoma Greener, MD as Referring Physician (Dermatology)  I have personally reviewed and noted the following in the patient's chart:   Medical and social history Use of alcohol, tobacco or illicit drugs  Current medications and supplements including opioid prescriptions. Functional ability and status Nutritional status Physical activity Advanced directives List of other physicians Hospitalizations, surgeries, and ER visits in previous 12 months Vitals Screenings to include cognitive, depression, and falls Referrals and appointments  No orders of the defined types were placed in this encounter.  In addition, I have reviewed and discussed with patient certain preventive protocols, quality metrics, and best practice recommendations. A written personalized care plan for preventive services as well as general preventive health recommendations were provided to patient.   Lolita Libra, CMA   05/25/2024   Return in 1  year (on 05/25/2025).  After Visit Summary: (MyChart) Due to this being a telephonic visit, the after visit summary with patients personalized plan was offered to patient via MyChart   Nurse Notes: nothing significant to report

## 2024-05-25 NOTE — Patient Instructions (Addendum)
 Terri Sandoval,  Thank you for taking the time for your Medicare Wellness Visit. I appreciate your continued commitment to your health goals. Please review the care plan we discussed, and feel free to reach out if I can assist you further.  Please note that Annual Wellness Visits do not include a physical exam. Some assessments may be limited, especially if the visit was conducted virtually. If needed, we may recommend an in-person follow-up with your provider.  Ongoing Care Seeing your primary care provider every 3 to 6 months helps us  monitor your health and provide consistent, personalized care.   Harlene Jolly, NP:  05/28/24 1pm Annual Wellness Visit:  05/27/25 9:40am, telephone.  Referrals If a referral was made during today's visit and you haven't received any updates within two weeks, please contact the referred provider directly to check on the status.  Recommended Screenings:  Health Maintenance  Topic Date Due   COVID-19 Vaccine (6 - 2025-26 season) 03/15/2024   Medicare Annual Wellness Visit  05/22/2024   DTaP/Tdap/Td vaccine (3 - Td or Tdap) 09/22/2030   Pneumococcal Vaccine for age over 32  Completed   Flu Shot  Completed   DEXA scan (bone density measurement)  Completed   Zoster (Shingles) Vaccine  Completed   Meningitis B Vaccine  Aged Out   Breast Cancer Screening  Discontinued       05/23/2023    9:56 AM  Advanced Directives  Does Patient Have a Medical Advance Directive? Yes  Type of Estate Agent of [place];Living will  Does patient want to make changes to medical advance directive? No - Patient declined  Copy of Healthcare Power of Attorney in Chart? Yes - validated most recent copy scanned in chart (See row information)    Vision: Annual vision screenings are recommended for early detection of glaucoma, cataracts, and diabetic retinopathy. These exams can also reveal signs of chronic conditions such as diabetes and high blood  pressure.  Dental: Annual dental screenings help detect early signs of oral cancer, gum disease, and other conditions linked to overall health, including heart disease and diabetes.  Please see the attached documents for additional preventive care recommendations.

## 2024-05-27 ENCOUNTER — Ambulatory Visit (INDEPENDENT_AMBULATORY_CARE_PROVIDER_SITE_OTHER): Admitting: Family Medicine

## 2024-05-27 ENCOUNTER — Encounter: Payer: Medicare Other | Admitting: Family Medicine

## 2024-05-27 ENCOUNTER — Encounter: Payer: Self-pay | Admitting: Family Medicine

## 2024-05-27 DIAGNOSIS — R739 Hyperglycemia, unspecified: Secondary | ICD-10-CM | POA: Diagnosis not present

## 2024-05-27 DIAGNOSIS — I10 Essential (primary) hypertension: Secondary | ICD-10-CM | POA: Diagnosis not present

## 2024-05-27 DIAGNOSIS — Z85828 Personal history of other malignant neoplasm of skin: Secondary | ICD-10-CM

## 2024-05-27 DIAGNOSIS — E782 Mixed hyperlipidemia: Secondary | ICD-10-CM | POA: Diagnosis not present

## 2024-05-27 DIAGNOSIS — E559 Vitamin D deficiency, unspecified: Secondary | ICD-10-CM | POA: Diagnosis not present

## 2024-05-27 DIAGNOSIS — Z1231 Encounter for screening mammogram for malignant neoplasm of breast: Secondary | ICD-10-CM

## 2024-05-27 DIAGNOSIS — R7989 Other specified abnormal findings of blood chemistry: Secondary | ICD-10-CM

## 2024-05-27 DIAGNOSIS — Z0000 Encounter for general adult medical examination without abnormal findings: Secondary | ICD-10-CM

## 2024-05-27 NOTE — Patient Instructions (Signed)
 Preventive Care 83 Years and Older, Female Preventive care refers to lifestyle choices and visits with your health care provider that can promote health and wellness. Preventive care visits are also called wellness exams. What can I expect for my preventive care visit? Counseling Your health care provider may ask you questions about your: Medical history, including: Past medical problems. Family medical history. Pregnancy and menstrual history. History of falls. Current health, including: Memory and ability to understand (cognition). Emotional well-being. Home life and relationship well-being. Sexual activity and sexual health. Lifestyle, including: Alcohol, nicotine or tobacco, and drug use. Access to firearms. Diet, exercise, and sleep habits. Work and work Astronomer. Sunscreen use. Safety issues such as seatbelt and bike helmet use. Physical exam Your health care provider will check your: Height and weight. These may be used to calculate your BMI (body mass index). BMI is a measurement that tells if you are at a healthy weight. Waist circumference. This measures the distance around your waistline. This measurement also tells if you are at a healthy weight and may help predict your risk of certain diseases, such as type 2 diabetes and high blood pressure. Heart rate and blood pressure. Body temperature. Skin for abnormal spots. What immunizations do I need?  Vaccines are usually given at various ages, according to a schedule. Your health care provider will recommend vaccines for you based on your age, medical history, and lifestyle or other factors, such as travel or where you work. What tests do I need? Screening Your health care provider may recommend screening tests for certain conditions. This may include: Lipid and cholesterol levels. Hepatitis C test. Hepatitis B test. HIV (human immunodeficiency virus) test. STI (sexually transmitted infection) testing, if you are at  risk. Lung cancer screening. Colorectal cancer screening. Diabetes screening. This is done by checking your blood sugar (glucose) after you have not eaten for a while (fasting). Mammogram. Talk with your health care provider about how often you should have regular mammograms. BRCA-related cancer screening. This may be done if you have a family history of breast, ovarian, tubal, or peritoneal cancers. Bone density scan. This is done to screen for osteoporosis. Talk with your health care provider about your test results, treatment options, and if necessary, the need for more tests. Follow these instructions at home: Eating and drinking  Eat a diet that includes fresh fruits and vegetables, whole grains, lean protein, and low-fat dairy products. Limit your intake of foods with high amounts of sugar, saturated fats, and salt. Take vitamin and mineral supplements as recommended by your health care provider. Do not drink alcohol if your health care provider tells you not to drink. If you drink alcohol: Limit how much you have to 0-1 drink a day. Know how much alcohol is in your drink. In the [place] one drink equals one 12 oz bottle of beer (355 mL), one 5 oz glass of wine (148 mL), or one 1 oz glass of hard liquor (44 mL). Lifestyle Brush your teeth every morning and night with fluoride toothpaste. Floss one time each day. Exercise for at least 30 minutes 5 or more days each week. Do not use any products that contain nicotine or tobacco. These products include cigarettes, chewing tobacco, and vaping devices, such as e-cigarettes. If you need help quitting, ask your health care provider. Do not use drugs. If you are sexually active, practice safe sex. Use a condom or other form of protection in order to prevent STIs. Take aspirin only as told by  your health care provider. Make sure that you understand how much to take and what form to take. Work with your health care provider to find out whether it  is safe and beneficial for you to take aspirin daily. Ask your health care provider if you need to take a cholesterol-lowering medicine (statin). Find healthy ways to manage stress, such as: Meditation, yoga, or listening to music. Journaling. Talking to a trusted person. Spending time with friends and family. Minimize exposure to UV radiation to reduce your risk of skin cancer. Safety Always wear your seat belt while driving or riding in a vehicle. Do not drive: If you have been drinking alcohol. Do not ride with someone who has been drinking. When you are tired or distracted. While texting. If you have been using any mind-altering substances or drugs. Wear a helmet and other protective equipment during sports activities. If you have firearms in your house, make sure you follow all gun safety procedures. What's next? Visit your health care provider once a year for an annual wellness visit. Ask your health care provider how often you should have your eyes and teeth checked. Stay up to date on all vaccines. This information is not intended to replace advice given to you by your health care provider. Make sure you discuss any questions you have with your health care provider. Document Revised: 12/27/2020 Document Reviewed: 12/27/2020 Elsevier Patient Education  2024 [employer].

## 2024-05-28 ENCOUNTER — Encounter: Payer: Self-pay | Admitting: Family Medicine

## 2024-05-28 ENCOUNTER — Encounter: Admitting: Student

## 2024-05-28 ENCOUNTER — Ambulatory Visit: Payer: Self-pay | Admitting: Family Medicine

## 2024-05-28 LAB — CBC WITH DIFFERENTIAL/PLATELET
Basophils Absolute: 0.1 K/uL (ref 0.0–0.1)
Basophils Relative: 1.6 % (ref 0.0–3.0)
Eosinophils Absolute: 0.1 K/uL (ref 0.0–0.7)
Eosinophils Relative: 1.5 % (ref 0.0–5.0)
HCT: 40.9 % (ref 36.0–46.0)
Hemoglobin: 13.6 g/dL (ref 12.0–15.0)
Lymphocytes Relative: 37.2 % (ref 12.0–46.0)
Lymphs Abs: 2.2 K/uL (ref 0.7–4.0)
MCHC: 33.2 g/dL (ref 30.0–36.0)
MCV: 90.2 fl (ref 78.0–100.0)
Monocytes Absolute: 0.6 K/uL (ref 0.1–1.0)
Monocytes Relative: 10.3 % (ref 3.0–12.0)
Neutro Abs: 2.9 K/uL (ref 1.4–7.7)
Neutrophils Relative %: 49.4 % (ref 43.0–77.0)
Platelets: 235 K/uL (ref 150.0–400.0)
RBC: 4.53 Mil/uL (ref 3.87–5.11)
RDW: 14.5 % (ref 11.5–15.5)
WBC: 5.9 K/uL (ref 4.0–10.5)

## 2024-05-28 LAB — COMPREHENSIVE METABOLIC PANEL WITH GFR
ALT: 17 U/L (ref 0–35)
AST: 22 U/L (ref 0–37)
Albumin: 4.4 g/dL (ref 3.5–5.2)
Alkaline Phosphatase: 68 U/L (ref 39–117)
BUN: 16 mg/dL (ref 6–23)
CO2: 32 meq/L (ref 19–32)
Calcium: 9.3 mg/dL (ref 8.4–10.5)
Chloride: 98 meq/L (ref 96–112)
Creatinine, Ser: 0.85 mg/dL (ref 0.40–1.20)
GFR: 63.6 mL/min (ref >60.00)
Glucose, Bld: 90 mg/dL (ref 70–99)
Potassium: 3.9 meq/L (ref 3.5–5.1)
Sodium: 138 meq/L (ref 135–145)
Total Bilirubin: 0.3 mg/dL (ref 0.2–1.2)
Total Protein: 7.2 g/dL (ref 6.0–8.3)

## 2024-05-28 LAB — LIPID PANEL
Cholesterol: 174 mg/dL (ref 0–200)
HDL: 72.2 mg/dL (ref >39.00)
LDL Cholesterol: 77 mg/dL (ref 0–99)
NonHDL: 101.42
Total CHOL/HDL Ratio: 2
Triglycerides: 122 mg/dL (ref 0.0–149.0)
VLDL: 24.4 mg/dL (ref 0.0–40.0)

## 2024-05-28 LAB — HEMOGLOBIN A1C: Hgb A1c MFr Bld: 6.1 % (ref 4.6–6.5)

## 2024-05-28 LAB — VITAMIN D 25 HYDROXY (VIT D DEFICIENCY, FRACTURES): VITD: 32.06 ng/mL (ref 30.00–100.00)

## 2024-05-28 LAB — TSH: TSH: 3.87 u[IU]/mL (ref 0.35–5.50)

## 2024-05-28 NOTE — Assessment & Plan Note (Signed)
 Encourage heart healthy diet such as MIND or DASH diet, increase exercise, avoid trans fats, simple carbohydrates and processed foods, consider a krill or fish or flaxseed oil cap daily.

## 2024-05-28 NOTE — Progress Notes (Addendum)
 q  Subjective:    Patient ID: Terri Sandoval, female    DOB: [birth year], 83 y.o.   MRN: [id number]  Chief Complaint  Patient presents with   Annual Exam    Patient presents today for a physical exam.    HPI Discussed the use of AI scribe software for clinical note transcription with the patient, who gave verbal consent to proceed.  History of Present Illness Terri Sandoval is an 83 year old female who presents for a routine follow-up visit.  She remains active mostly doing work around her home. She has a history of taking Fosamax for bone density and is considering delaying her next bone density scan, with the last scan being three years ago. She had her last mammogram in April 2024 and skipped the previous year.  She has a history of seborrheic keratosis, which she refers to as 'barnacles', and regularly visits a dermatologist. Recently, a lesion was removed from her knee, and she is being monitored for a spot on her lip. No breast lumps or concerns.  She experiences arm pain, which has been alleviated by chiropractic care, and she continues monthly visits. She reports significant back pain since a sepsis episode, which she manages by sitting or lying down. She is cautious about using pain medication due to potential side effects.  She mentions thinning hair and currently takes a multivitamin and fish oil. She uses Citrucel for bowel regularity and reports it works well for her.    Past Medical History:  Diagnosis Date   Adrenal insufficiency    Anxiety    Arthritis    Atrophic vaginitis 11/08/2013   BPV (benign positional vertigo) 05/08/2015   Cataract    Chicken pox as a child   Colon polyps    removed during colonscopy   Diverticulitis    Diverticulosis    GERD (gastroesophageal reflux disease)    Hyperlipidemia    Hypertension    Medicare annual wellness visit, subsequent 05/16/2013   Osteoporosis, unspecified 03/09/2013   Preventative health care 11/07/2015   Skin cancer     behind ear   Urine incontinence    sometimes   Verruca 11/07/2015   Vitamin D  deficiency 11/07/2015    Past Surgical History:  Procedure Laterality Date   ABDOMINAL HYSTERECTOMY  97   has ovaries   BREAST BIOPSY Left     FNA   CATARACT EXTRACTION  L 09/11/10, R 10/10/10   DILATION AND CURETTAGE OF UTERUS  1990, 1993, 1997   EYE SURGERY Bilateral 10/10/2010, 07/28/17   cataracts, posterior capsulotomy b/l   TONSILLECTOMY AND ADENOIDECTOMY  1990    Family History  Problem Relation Age of Onset   Arthritis Mother    Hyperlipidemia Mother    Heart disease Mother    Hypertension Mother    Colon cancer Father    Diabetes Sister        type 2   Heart disease Sister    Hypertension Sister    Hyperlipidemia Sister    Mental illness Maternal Uncle    Stroke Maternal Grandmother    Pneumonia Maternal Grandfather    Cerebral palsy Son        MR   Colon cancer Cousin     Social History   Socioeconomic History   Marital status: Married    Spouse name: Not on file   Number of children: 2   Years of education: Not on file   Highest education level: 12th grade  Occupational History  Occupation: retired  Tobacco Use   Smoking status: Never   Smokeless tobacco: Never  Vaping Use   Vaping status: Never Used  Substance and Sexual Activity   Alcohol use: No   Drug use: No   Sexual activity: Never    Comment: lives with husband and disabled son, no dietary restrictions, not exercising regularly  Other Topics Concern   Not on file  Social History Narrative   Not on file   Social Drivers of Health   Financial Resource Strain: Low Risk  (08/07/2023)   Overall Financial Resource Strain (CARDIA)    Difficulty of Paying Living Expenses: Not hard at all  Food Insecurity: No Food Insecurity (05/25/2024)   Hunger Vital Sign    Worried About Running Out of Food in the Last Year: Never true    Ran Out of Food in the Last Year: Never true  Transportation Needs: No Transportation  Needs (05/25/2024)   PRAPARE - Administrator, Civil Service (Medical): No    Lack of Transportation (Non-Medical): No  Physical Activity: Inactive (05/25/2024)   Exercise Vital Sign    Days of Exercise per Week: 0 days    Minutes of Exercise per Session: 0 min  Stress: No Stress Concern Present (05/25/2024)   [employer] of Occupational Health - Occupational Stress Questionnaire    Feeling of Stress: Only a little  Social Connections: Socially Integrated (05/25/2024)   Social Connection and Isolation Panel    Frequency of Communication with Friends and Family: More than three times a week    Frequency of Social Gatherings with Friends and Family: Once a week    Attends Religious Services: More than 4 times per year    Active Member of [employer] or Organizations: Yes    Attends Engineer, Structural: More than 4 times per year    Marital Status: Married  Catering Manager Violence: Not At Risk (05/25/2024)   Humiliation, Afraid, Rape, and Kick questionnaire    Fear of Current or Ex-Partner: No    Emotionally Abused: No    Physically Abused: No    Sexually Abused: No    Outpatient Medications Prior to Visit  Medication Sig Dispense Refill   Calcium Carbonate Antacid (TUMS PO) Take 1 tablet by mouth as needed.     cetirizine (ZYRTEC) 10 MG tablet Take 10 mg by mouth daily as needed for allergies.     Cholecalciferol (VITAMIN D3) 50 MCG (2000 UT) capsule Take 1 capsule by mouth daily.     FLUoxetine  (PROZAC ) 20 MG capsule TAKE 1 CAPSULE(20 MG) BY MOUTH DAILY 90 capsule 1   glucosamine-chondroitin 500-400 MG tablet Take 1 tablet by mouth daily.     methylcellulose oral powder Take 1 packet by mouth daily. Citrucel     Multiple Vitamin (MULTIVITAMIN) tablet Take 1 tablet by mouth daily. Vitamin pack from [employer]     OMEGA-3 FATTY ACIDS PO Take 1,200 mg by mouth daily.     Pumpkin Seed-Soy Germ (AZO BLADDER CONTROL/GO-LESS PO) Take 1 tablet by mouth in the morning and  at bedtime.     ramipril  (ALTACE ) 10 MG capsule TAKE 1 CAPSULE(10 MG) BY MOUTH DAILY 90 capsule 1   simvastatin  (ZOCOR ) 20 MG tablet TAKE 1 TABLET(20 MG) BY MOUTH DAILY AT 6 PM 90 tablet 1   Skin Protectants, Misc. (AQUAPHOR LIP REPAIR EX) Apply topically at bedtime.     No facility-administered medications prior to visit.    Allergies  Allergen Reactions  Sulfamethoxazole-Trimethoprim Other (See Comments)    Very sore, sore in corner of lip    Review of Systems  Constitutional:  Negative for fever and malaise/fatigue.  HENT:  Negative for congestion.   Eyes:  Negative for blurred vision.  Respiratory:  Negative for shortness of breath.   Cardiovascular:  Negative for chest pain, palpitations and leg swelling.  Gastrointestinal:  Negative for abdominal pain, blood in stool and nausea.  Genitourinary:  Negative for dysuria and frequency.  Musculoskeletal:  Positive for back pain and joint pain. Negative for falls.  Skin:  Negative for rash.  Neurological:  Negative for dizziness, loss of consciousness and headaches.  Endo/Heme/Allergies:  Negative for environmental allergies.  Psychiatric/Behavioral:  Negative for depression. The patient is not nervous/anxious.        Objective:    Physical Exam Constitutional:      General: She is not in acute distress.    Appearance: Normal appearance. She is well-developed. She is not toxic-appearing.  HENT:     Head: Normocephalic and atraumatic.     Right Ear: External ear normal.     Left Ear: External ear normal.     Nose: Nose normal.  Eyes:     General:        Right eye: No discharge.        Left eye: No discharge.     Conjunctiva/sclera: Conjunctivae normal.  Neck:     Thyroid : No thyromegaly.  Cardiovascular:     Rate and Rhythm: Normal rate and regular rhythm.     Heart sounds: Normal heart sounds. No murmur heard. Pulmonary:     Effort: Pulmonary effort is normal. No respiratory distress.     Breath sounds: Normal  breath sounds.  Abdominal:     General: Bowel sounds are normal.     Palpations: Abdomen is soft.     Tenderness: There is no abdominal tenderness. There is no guarding.  Musculoskeletal:        General: Normal range of motion.     Cervical back: Neck supple.  Lymphadenopathy:     Cervical: No cervical adenopathy.  Skin:    General: Skin is warm and dry.  Neurological:     Mental Status: She is alert and oriented to person, place, and time.  Psychiatric:        Mood and Affect: Mood normal.        Behavior: Behavior normal.        Thought Content: Thought content normal.        Judgment: Judgment normal.     There were no vitals taken for this visit. Wt Readings from Last 3 Encounters:  05/25/24 143 lb (64.9 kg)  08/14/23 145 lb 12.8 oz (66.1 kg)  06/18/23 142 lb (64.4 kg)    Diabetic Foot Exam - Simple   No data filed    Lab Results  Component Value Date   WBC 5.9 05/27/2024   HGB 13.6 05/27/2024   HCT 40.9 05/27/2024   PLT 235.0 05/27/2024   GLUCOSE 90 05/27/2024   CHOL 174 05/27/2024   TRIG 122.0 05/27/2024   HDL 72.20 05/27/2024   LDLCALC 77 05/27/2024   ALT 17 05/27/2024   AST 22 05/27/2024   NA 138 05/27/2024   K 3.9 05/27/2024   CL 98 05/27/2024   CREATININE 0.85 05/27/2024   BUN 16 05/27/2024   CO2 32 05/27/2024   TSH 3.87 05/27/2024   HGBA1C 6.1 05/27/2024    Lab Results  Component  Value Date   TSH 3.87 05/27/2024   Lab Results  Component Value Date   WBC 5.9 05/27/2024   HGB 13.6 05/27/2024   HCT 40.9 05/27/2024   MCV 90.2 05/27/2024   PLT 235.0 05/27/2024   Lab Results  Component Value Date   NA 138 05/27/2024   K 3.9 05/27/2024   CO2 32 05/27/2024   GLUCOSE 90 05/27/2024   BUN 16 05/27/2024   CREATININE 0.85 05/27/2024   BILITOT 0.3 05/27/2024   ALKPHOS 68 05/27/2024   AST 22 05/27/2024   ALT 17 05/27/2024   PROT 7.2 05/27/2024   ALBUMIN 4.4 05/27/2024   CALCIUM 9.3 05/27/2024   GFR 63.60 05/27/2024   Lab Results   Component Value Date   CHOL 174 05/27/2024   Lab Results  Component Value Date   HDL 72.20 05/27/2024   Lab Results  Component Value Date   LDLCALC 77 05/27/2024   Lab Results  Component Value Date   TRIG 122.0 05/27/2024   Lab Results  Component Value Date   CHOLHDL 2 05/27/2024   Lab Results  Component Value Date   HGBA1C 6.1 05/27/2024       Assessment & Plan:  Primary hypertension Assessment & Plan: Well controlled, no changes to meds. Encouraged heart healthy diet such as the DASH diet and exercise as tolerated.   Orders: -     TSH -     Comprehensive metabolic panel with GFR -     CBC with Differential/Platelet  Mixed hyperlipidemia Assessment & Plan: Encourage heart healthy diet such as MIND or DASH diet, increase exercise, avoid trans fats, simple carbohydrates and processed foods, consider a krill or fish or flaxseed oil cap daily.   Orders: -     Lipid panel  Hyperglycemia Assessment & Plan: hgba1c acceptable, minimize simple carbs. Increase exercise as tolerated.   Orders: -     Hemoglobin A1c  Vitamin D  deficiency Assessment & Plan: Supplement and monitor   Orders: -     VITAMIN D  25 Hydroxy (Vit-D Deficiency, Fractures)  Abnormal TSH -     TSH  Preventative health care Assessment & Plan: MGM ordered. Patient without concerns  Orders: -     VITAMIN D  25 Hydroxy (Vit-D Deficiency, Fractures) -     TSH -     Hemoglobin A1c -     Comprehensive metabolic panel with GFR -     CBC with Differential/Platelet -     Lipid panel  Breast cancer screening by mammogram Assessment & Plan: MGM ordered. Patient without concerns  Orders: -     3D Screening Mammogram, Left and Right; Future  History of skin cancer in adulthood Assessment & Plan: She continues to follow with dermatology     Assessment and Plan Assessment & Plan Adult Wellness Visit Routine wellness visit with emphasis on maintaining physical activity and healthy  lifestyle. Discussed benefits of exercise, including housework, and potential use of CardiaMobile for monitoring heart health. Addressed pain management options and risks of overuse of pain medications. Discussed potential benefits of acupuncture for back pain. Encouraged regular dermatology visits for skin cancer prevention. - Encouraged regular physical activity, including housework and use of light weights. - Consider CardiaMobile for heart monitoring. - Discussed pain management options, emphasizing risks of overuse of pain medications. - Consider acupuncture for back pain. - Encouraged regular dermatology visits for skin cancer prevention.  Primary hypertension Blood pressure is well-controlled with current medication regimen. - Continue current antihypertensive medication regimen.  Mixed hyperlipidemia No specific discussion regarding current status or management of hyperlipidemia. - Continue current lipid-lowering therapy.  Hyperglycemia No specific discussion regarding current status or management of hyperglycemia. - Continue current management plan.  Vitamin D  deficiency Vitamin D  supplementation is ongoing. - Continue current vitamin D  supplementation.  Abnormal TSH Abnormal TSH levels noted, but insurance coverage issues have been addressed by linking it to blood pressure monitoring. - Continue monitoring TSH levels.  Breast cancer screening Mammogram is due as it has been 18 months since the last screening. No current breast lumps or concerns reported. - Ordered mammogram.  Recording duration: 17 minutes     Harlene Horton, MD

## 2024-05-28 NOTE — Assessment & Plan Note (Signed)
 Supplement and monitor

## 2024-05-28 NOTE — Assessment & Plan Note (Signed)
 hgba1c acceptable, minimize simple carbs. Increase exercise as tolerated.

## 2024-05-28 NOTE — Assessment & Plan Note (Signed)
 Well controlled, no changes to meds. Encouraged heart healthy diet such as the DASH diet and exercise as tolerated.

## 2024-05-28 NOTE — Assessment & Plan Note (Signed)
She continues to follow with dermatology.

## 2024-05-28 NOTE — Assessment & Plan Note (Signed)
 MGM ordered. Patient without concerns

## 2024-06-01 DIAGNOSIS — M25511 Pain in right shoulder: Secondary | ICD-10-CM | POA: Diagnosis not present

## 2024-06-01 DIAGNOSIS — M9903 Segmental and somatic dysfunction of lumbar region: Secondary | ICD-10-CM | POA: Diagnosis not present

## 2024-06-01 DIAGNOSIS — M62830 Muscle spasm of back: Secondary | ICD-10-CM | POA: Diagnosis not present

## 2024-06-01 DIAGNOSIS — M25561 Pain in right knee: Secondary | ICD-10-CM | POA: Diagnosis not present

## 2024-06-01 DIAGNOSIS — M25552 Pain in left hip: Secondary | ICD-10-CM | POA: Diagnosis not present

## 2024-06-01 DIAGNOSIS — M25521 Pain in right elbow: Secondary | ICD-10-CM | POA: Diagnosis not present

## 2024-06-01 DIAGNOSIS — M9902 Segmental and somatic dysfunction of thoracic region: Secondary | ICD-10-CM | POA: Diagnosis not present

## 2024-06-01 DIAGNOSIS — M5417 Radiculopathy, lumbosacral region: Secondary | ICD-10-CM | POA: Diagnosis not present

## 2024-06-16 ENCOUNTER — Ambulatory Visit: Payer: Medicare Other | Admitting: Pharmacist

## 2024-06-16 VITALS — BP 128/69 | Temp 97.7°F | Wt 143.0 lb

## 2024-06-16 DIAGNOSIS — I10 Essential (primary) hypertension: Secondary | ICD-10-CM

## 2024-06-16 DIAGNOSIS — Z79899 Other long term (current) drug therapy: Secondary | ICD-10-CM

## 2024-06-16 DIAGNOSIS — E782 Mixed hyperlipidemia: Secondary | ICD-10-CM

## 2024-06-16 NOTE — Progress Notes (Signed)
 Pharmacy Note  06/16/2024 Name: Terri Sandoval MRN: [id number] DOB: 03/22/1941  Subjective: Terri Sandoval is a 83 y.o. year old female who is a primary care patient of Domenica Harlene LABOR, MD. Clinical Pharmacist Practitioner referral was placed to assist with medication management.    Engaged with patient by telephone for follow up visit today.  Hypertension -  Currently taking ramipril  10mg  daily  Checks blood pressure about once a week - blood pressure today was 128/69 She continues to do her own housework and exercises sporadically.   Anxiety-  Current therapy: fluoxetine  20mg  daily.  Has some stress related to her handicapped son but reports this has improved recently. He is living in a group home but does visit patient's home some weekends and holidays   Diarrhea:  Takes Citrucel every day. Since she has been able to get Citrucel regularly again, diarrhea has improved.   Hyperlipidemia - taking simvastatin  20mg  daily. LDL goal < 100. Last LDL was 77.  Medication Management:  Patient is planning to stop ordering [employer] vitamin packs. She would like to start just a multi-vitamin and also would like recommendations on calcium supplement.   She does have 1 glass of milk each day.   Objective: Review of patient status, including review of consultants reports, laboratory and other test data, was performed as part of comprehensive.  Body mass index is 27.93 kg/m.  BP Readings from Last 3 Encounters:  06/16/24 128/69  05/25/24 133/69  08/14/23 122/76     Lab Results  Component Value Date   CREATININE 0.85 05/27/2024   CREATININE 0.84 08/14/2023   CREATININE 0.83 12/26/2022    Lab Results  Component Value Date   HGBA1C 6.1 05/27/2024       Component Value Date/Time   CHOL 174 05/27/2024 1420   TRIG 122.0 05/27/2024 1420   HDL 72.20 05/27/2024 1420   CHOLHDL 2 05/27/2024 1420   VLDL 24.4 05/27/2024 1420   LDLCALC 77 05/27/2024 1420   LDLCALC 76 05/16/2020 0812      Clinical ASCVD: No  The ASCVD Risk score (Arnett DK, et al., 2019) failed to calculate for the following reasons:   The 2019 ASCVD risk score is only valid for ages 47 to 70    BP Readings from Last 3 Encounters:  06/16/24 128/69  05/25/24 133/69  08/14/23 122/76     Allergies  Allergen Reactions   Sulfamethoxazole-Trimethoprim Other (See Comments)    Very sore, sore in corner of lip    Medications Reviewed Today     Reviewed by Carla Milling, RPH-CPP (Pharmacist) on 06/16/24 at 1105  Med List Status: <None>   Medication Order Taking? Sig Documenting Provider Last Dose Status Informant  Calcium Carbonate Antacid (TUMS PO) [id number] Yes Take 1 tablet by mouth as needed. [provider]  Active   Calcium Citrate-Vitamin D  (CALCIUM CITRATE +D PO) [id number] Yes Take 1 tablet by mouth 2 (two) times daily. 1 tablet = 300mg  of calcium (600mg  / day) [provider]  Active   cetirizine (ZYRTEC) 10 MG tablet [id number] Yes Take 10 mg by mouth daily as needed for allergies. [provider]  Active   Cholecalciferol (VITAMIN D3) 50 MCG (2000 UT) capsule [id number] Yes Take 1 capsule by mouth daily. [provider]  Active   FLUoxetine  (PROZAC ) 20 MG capsule [id number] Yes TAKE 1 CAPSULE(20 MG) BY MOUTH DAILY Domenica Harlene LABOR, MD  Active   glucosamine-chondroitin 500-400 MG tablet [id number] Yes Take 1 tablet  by mouth daily. [provider]  Active   methylcellulose oral powder [id number] Yes Take 1 packet by mouth daily. Citrucel [provider]  Active    Patient not taking:   Discontinued 06/16/24 1059 (Patient Preference)   Multiple Vitamins-Minerals (CENTRUM SILVER 50+WOMEN PO) [id number] Yes Take 1 tablet by mouth daily. [provider]  Active   OMEGA-3 FATTY ACIDS PO [id number] Yes Take 1,200 mg by mouth daily. [provider]  Active   Pumpkin Seed-Soy Germ (AZO BLADDER CONTROL/GO-LESS PO) [id number] Yes Take 1 tablet  by mouth in the morning and at bedtime. [provider]  Active   ramipril  (ALTACE ) 10 MG capsule [id number] Yes TAKE 1 CAPSULE(10 MG) BY MOUTH DAILY Domenica Harlene LABOR, MD  Active   simvastatin  (ZOCOR ) 20 MG tablet [id number] Yes TAKE 1 TABLET(20 MG) BY MOUTH DAILY AT 6 PM Domenica Harlene LABOR, MD  Active   Skin Protectants, Misc. (AQUAPHOR LIP REPAIR EX) [id number] Yes Apply topically at bedtime. [provider]  Active             Patient Active Problem List   Diagnosis Date Noted   Knee pain, right 12/26/2022   Pedal edema 04/25/2022   Posterior vitreous detachment of both eyes 02/27/2022   Chronic left sacroiliac pain 11/02/2020   Abnormal TSH 11/02/2020   Lip lesion 05/04/2019   Allergy 11/15/2018   Fall 12/04/2017   Hyperglycemia 12/04/2017   Abnormal results of thyroid  function studies 05/26/2017   Vitamin D  deficiency 11/07/2015   Breast cancer screening by mammogram 11/07/2015   Verruca 11/07/2015   BPV (benign positional vertigo) 05/08/2015   History of skin cancer in adulthood 10/31/2014   Atrophic vaginitis 11/08/2013   Cerumen impaction 06/11/2013   Medicare annual wellness visit, subsequent 05/16/2013   Osteoporosis 03/09/2013   GERD (gastroesophageal reflux disease)    Hypertension    Anxiety    Hyperlipidemia    Arthritis    Chicken pox    Diverticulosis    Colon polyps    Urine incontinence      Medication Assistance:  None required.  Patient affirms current coverage meets needs.   Assessment / Plan: Hypertension - Controlled Continue ramipril  10mg  daily Continue to check blood pressure at home once per week - goal < 130/80  Anxiety  Continue fluoxetine  daily.   Diarrhea -  Continue Citrucel daily   Hyperlipidemia - tat goal LDL of < 100 Continue simvastatin  20mg  daily.   Medication management:  Reviewed and updated medication list Reviewed refill history and adherence Recommended she could start Centrum Silver 50+ for women - 1  tablet daily will supply 300mg  of calcium, 1000 units of vitamin D  and Biotin 30 Mcg (patient mentioned she would like to take Biotin for thinning hair) .  Recommended calcium citrate - NOW supplement - 1 tablet twice a day. (Each tab has 300mg  of calcium and 200 units of vitamin D  for total of 600mg  of calcium and 400 units of vitamin D ) Patient will also continue NOW vitamin D  supplement with 2000 units. Estimate that her daily calcium intake from diet and vitamins will be between 1200 - 1500mg  per day.    Follow Up:  Telephone follow up appointment with care management team member scheduled for:  1 year.   Madelin Ray, PharmD Clinical Pharmacist [facility]  - [facility] [phone]

## 2024-07-12 ENCOUNTER — Ambulatory Visit: Admitting: Podiatry

## 2024-07-12 DIAGNOSIS — M79674 Pain in right toe(s): Secondary | ICD-10-CM

## 2024-07-12 DIAGNOSIS — M79675 Pain in left toe(s): Secondary | ICD-10-CM | POA: Diagnosis not present

## 2024-07-12 DIAGNOSIS — B351 Tinea unguium: Secondary | ICD-10-CM

## 2024-07-12 NOTE — Progress Notes (Signed)
"   °  °  Subjective:  Patient ID: Terri Sandoval, female    DOB: 04/22/1941,  MRN: [id number]  Terri Sandoval presents to clinic today for:  Chief Complaint  Patient presents with   [facility]    RFC Non diabetic toenail trim.    Patient notes nails are thick, discolored, elongated and painful in shoegear when trying to ambulate.    PCP is Domenica Harlene LABOR, MD. last seen 05/27/2024  Past Medical History:  Diagnosis Date   Adrenal insufficiency    Anxiety    Arthritis    Atrophic vaginitis 11/08/2013   BPV (benign positional vertigo) 05/08/2015   Cataract    Chicken pox as a child   Colon polyps    removed during colonscopy   Diverticulitis    Diverticulosis    GERD (gastroesophageal reflux disease)    Hyperlipidemia    Hypertension    Medicare annual wellness visit, subsequent 05/16/2013   Osteoporosis, unspecified 03/09/2013   Preventative health care 11/07/2015   Skin cancer    behind ear   Urine incontinence    sometimes   Verruca 11/07/2015   Vitamin D  deficiency 11/07/2015   Past Surgical History:  Procedure Laterality Date   ABDOMINAL HYSTERECTOMY  97   has ovaries   BREAST BIOPSY Left     FNA   CATARACT EXTRACTION  L 09/11/10, R 10/10/10   DILATION AND CURETTAGE OF UTERUS  1990, 1993, 1997   EYE SURGERY Bilateral 10/10/2010, 07/28/17   cataracts, posterior capsulotomy b/l   TONSILLECTOMY AND ADENOIDECTOMY  1990   Allergies[1]  Review of Systems: Negative except as noted in the HPI.  Objective:  Terri Sandoval is a pleasant 83 y.o. female in NAD. AAO x 3.  Vascular Examination: Capillary refill time is 3-5 seconds to toes bilateral. Palpable pedal pulses b/l LE. Digital hair present b/l.  Skin temperature gradient WNL b/l. No varicosities b/l. No cyanosis noted b/l.   Dermatological Examination: Pedal skin with normal turgor, texture and tone b/l. No open wounds. No interdigital macerations b/l. Toenails x10 are 3mm thick, discolored, dystrophic with subungual debris.  There is pain with compression of the nail plates.  They are elongated x10     Latest Ref Rng & Units 05/27/2024    2:20 PM 08/14/2023   11:52 AM  Hemoglobin A1C  Hemoglobin-A1c 4.6 - 6.5 % 6.1  6.3    Assessment/Plan: 1. Pain due to onychomycosis of toenails of both feet    The mycotic toenails were sharply debrided x10 with sterile nail nippers and a power debriding burr to decrease bulk/thickness and length.     Awanda CHARM Imperial, DPM, FACFAS [facility]     [address] [place], [place] [address]                Office [phone]  Fax [phone]    [1]  Allergies Allergen Reactions   Sulfamethoxazole-Trimethoprim Other (See Comments)    Very sore, sore in corner of lip   "

## 2024-07-13 ENCOUNTER — Ambulatory Visit: Admitting: Podiatry

## 2024-07-21 ENCOUNTER — Ambulatory Visit: Admitting: Podiatry

## 2024-08-04 ENCOUNTER — Telehealth: Payer: Self-pay | Admitting: Family Medicine

## 2024-08-04 ENCOUNTER — Ambulatory Visit (HOSPITAL_BASED_OUTPATIENT_CLINIC_OR_DEPARTMENT_OTHER)

## 2024-08-04 NOTE — Telephone Encounter (Unsigned)
 Copied from CRM #[id number]. Topic: General - Other >> Aug 04, 2024 11:57 AM Tiffini S wrote: Reason for CRM: Patient and her spouse have request transfer of care from Dr. Harlene Horton to Harlene Posner- that are asking for a same day appointment with back to back times- Ms. Wainright has to help her spouse during the appointments   Appointments was scheduled for 12/01/24 at 1:00pm for Mrs. Campoy and 10:40am for her spouse   Please call the patient ar [phone]

## 2024-08-05 NOTE — Telephone Encounter (Signed)
 Copied from CRM #[id number]. Topic: General - Other >> Aug 04, 2024 11:57 AM Tiffini S wrote: Reason for CRM: Patient and her spouse have request transfer of care from Dr. Harlene Horton to Harlene Posner- that are asking for a same day appointment with back to back times- Ms. Fruchter has to help her spouse during the appointments    Appointments was scheduled for 12/01/24 at 1:00pm for Mrs. Glab and 10:40am for her spouse    Please call the patient ar [phone]    Please advise if it would be ok to have these patients back to back

## 2024-08-05 NOTE — Telephone Encounter (Signed)
 Called to reschedule them

## 2024-08-31 ENCOUNTER — Ambulatory Visit (HOSPITAL_BASED_OUTPATIENT_CLINIC_OR_DEPARTMENT_OTHER)

## 2024-10-11 ENCOUNTER — Ambulatory Visit: Admitting: Podiatry

## 2024-11-17 ENCOUNTER — Encounter: Admitting: Student

## 2024-11-26 ENCOUNTER — Encounter: Admitting: Student

## 2024-11-29 ENCOUNTER — Ambulatory Visit: Admitting: Family Medicine

## 2024-12-01 ENCOUNTER — Encounter: Admitting: Student

## 2025-05-27 ENCOUNTER — Ambulatory Visit

## 2025-06-15 ENCOUNTER — Other Ambulatory Visit
